# Patient Record
Sex: Female | Born: 1945
Health system: Southern US, Community
[De-identification: ages and names within clinical notes are randomized; demographics above are authoritative.]

## PROBLEM LIST (undated history)

## (undated) DIAGNOSIS — J984 Other disorders of lung: Secondary | ICD-10-CM

## (undated) DIAGNOSIS — E785 Hyperlipidemia, unspecified: Secondary | ICD-10-CM

## (undated) DIAGNOSIS — K219 Gastro-esophageal reflux disease without esophagitis: Secondary | ICD-10-CM

## (undated) DIAGNOSIS — K429 Umbilical hernia without obstruction or gangrene: Secondary | ICD-10-CM

## (undated) DIAGNOSIS — E119 Type 2 diabetes mellitus without complications: Secondary | ICD-10-CM

## (undated) DIAGNOSIS — I10 Essential (primary) hypertension: Secondary | ICD-10-CM

## (undated) DIAGNOSIS — C884 Extranodal marginal zone B-cell lymphoma of mucosa-associated lymphoid tissue [MALT-lymphoma]: Secondary | ICD-10-CM

## (undated) DIAGNOSIS — M519 Unspecified thoracic, thoracolumbar and lumbosacral intervertebral disc disorder: Secondary | ICD-10-CM

## (undated) DIAGNOSIS — E236 Other disorders of pituitary gland: Secondary | ICD-10-CM

## (undated) DIAGNOSIS — D509 Iron deficiency anemia, unspecified: Secondary | ICD-10-CM

## (undated) DIAGNOSIS — J309 Allergic rhinitis, unspecified: Secondary | ICD-10-CM

## (undated) DIAGNOSIS — K5730 Diverticulosis of large intestine without perforation or abscess without bleeding: Secondary | ICD-10-CM

## (undated) HISTORY — DX: Other disorders of lung: J98.4

## (undated) HISTORY — PX: UPPER GASTROINTESTINAL ENDOSCOPY: SHX188

## (undated) HISTORY — DX: Iron deficiency anemia, unspecified: D50.9

## (undated) HISTORY — DX: Allergic rhinitis, unspecified: J30.9

## (undated) HISTORY — DX: Type 2 diabetes mellitus without complications: E11.9

## (undated) HISTORY — DX: Hyperlipidemia, unspecified: E78.5

## (undated) HISTORY — DX: Diverticulosis of large intestine without perforation or abscess without bleeding: K57.30

## (undated) HISTORY — PX: NO PAST SURGERIES: SHX2092

## (undated) HISTORY — DX: Essential (primary) hypertension: I10

## (undated) HISTORY — DX: Unspecified thoracic, thoracolumbar and lumbosacral intervertebral disc disorder: M51.9

## (undated) HISTORY — DX: Extranodal marginal zone B-cell lymphoma of mucosa-associated lymphoid tissue (MALT-lymphoma): C88.4

## (undated) HISTORY — DX: Umbilical hernia without obstruction or gangrene: K42.9

## (undated) HISTORY — DX: Other disorders of pituitary gland: E23.6

## (undated) HISTORY — DX: Gastro-esophageal reflux disease without esophagitis: K21.9

---

## 2000-02-22 ENCOUNTER — Encounter (INDEPENDENT_AMBULATORY_CARE_PROVIDER_SITE_OTHER): Payer: Self-pay | Admitting: Specialist

## 2000-02-22 ENCOUNTER — Other Ambulatory Visit: Admission: RE | Admit: 2000-02-22 | Discharge: 2000-02-22 | Payer: Self-pay | Admitting: Internal Medicine

## 2000-02-24 ENCOUNTER — Ambulatory Visit (HOSPITAL_COMMUNITY): Admission: RE | Admit: 2000-02-24 | Discharge: 2000-02-24 | Payer: Self-pay | Admitting: Internal Medicine

## 2000-02-24 ENCOUNTER — Encounter: Payer: Self-pay | Admitting: Internal Medicine

## 2000-08-20 ENCOUNTER — Encounter (INDEPENDENT_AMBULATORY_CARE_PROVIDER_SITE_OTHER): Payer: Self-pay | Admitting: Specialist

## 2000-08-20 ENCOUNTER — Other Ambulatory Visit: Admission: RE | Admit: 2000-08-20 | Discharge: 2000-08-20 | Payer: Self-pay | Admitting: Internal Medicine

## 2001-01-30 ENCOUNTER — Other Ambulatory Visit: Admission: RE | Admit: 2001-01-30 | Discharge: 2001-01-30 | Payer: Self-pay | Admitting: Internal Medicine

## 2001-01-30 ENCOUNTER — Encounter (INDEPENDENT_AMBULATORY_CARE_PROVIDER_SITE_OTHER): Payer: Self-pay | Admitting: Specialist

## 2001-02-25 ENCOUNTER — Encounter: Admission: RE | Admit: 2001-02-25 | Discharge: 2001-05-26 | Payer: Self-pay | Admitting: *Deleted

## 2001-10-07 ENCOUNTER — Other Ambulatory Visit: Admission: RE | Admit: 2001-10-07 | Discharge: 2001-10-07 | Payer: Self-pay | Admitting: Internal Medicine

## 2001-10-07 ENCOUNTER — Encounter (INDEPENDENT_AMBULATORY_CARE_PROVIDER_SITE_OTHER): Payer: Self-pay

## 2004-10-16 ENCOUNTER — Ambulatory Visit: Payer: Self-pay | Admitting: Oncology

## 2004-10-24 ENCOUNTER — Ambulatory Visit (HOSPITAL_COMMUNITY): Admission: RE | Admit: 2004-10-24 | Discharge: 2004-10-24 | Payer: Self-pay | Admitting: Neurosurgery

## 2005-05-01 ENCOUNTER — Ambulatory Visit: Payer: Self-pay | Admitting: Oncology

## 2005-10-31 ENCOUNTER — Ambulatory Visit: Payer: Self-pay | Admitting: Internal Medicine

## 2005-12-07 ENCOUNTER — Ambulatory Visit: Payer: Self-pay | Admitting: Internal Medicine

## 2005-12-21 ENCOUNTER — Ambulatory Visit: Payer: Self-pay | Admitting: Internal Medicine

## 2006-02-01 ENCOUNTER — Ambulatory Visit: Payer: Self-pay | Admitting: Oncology

## 2006-05-23 ENCOUNTER — Ambulatory Visit: Payer: Self-pay | Admitting: Internal Medicine

## 2006-06-04 ENCOUNTER — Ambulatory Visit (HOSPITAL_COMMUNITY): Admission: RE | Admit: 2006-06-04 | Discharge: 2006-06-04 | Payer: Self-pay | Admitting: Internal Medicine

## 2006-10-03 ENCOUNTER — Ambulatory Visit: Payer: Self-pay | Admitting: Internal Medicine

## 2006-10-04 ENCOUNTER — Ambulatory Visit: Payer: Self-pay

## 2007-01-07 ENCOUNTER — Ambulatory Visit: Payer: Self-pay | Admitting: Oncology

## 2007-01-10 LAB — CBC WITH DIFFERENTIAL/PLATELET
BASO%: 1.3 % (ref 0.0–2.0)
Basophils Absolute: 0.1 10*3/uL (ref 0.0–0.1)
EOS%: 1.7 % (ref 0.0–7.0)
HCT: 38.3 % (ref 34.8–46.6)
HGB: 12.7 g/dL (ref 11.6–15.9)
LYMPH%: 44.6 % (ref 14.0–48.0)
MCH: 29.1 pg (ref 26.0–34.0)
MCHC: 33.1 g/dL (ref 32.0–36.0)
MCV: 88 fL (ref 81.0–101.0)
MONO%: 7.7 % (ref 0.0–13.0)
NEUT%: 44.7 % (ref 39.6–76.8)
lymph#: 2.6 10*3/uL (ref 0.9–3.3)

## 2007-03-19 ENCOUNTER — Ambulatory Visit: Payer: Self-pay | Admitting: Internal Medicine

## 2007-03-19 ENCOUNTER — Ambulatory Visit (HOSPITAL_COMMUNITY): Admission: RE | Admit: 2007-03-19 | Discharge: 2007-03-19 | Payer: Self-pay | Admitting: Internal Medicine

## 2007-03-19 LAB — CONVERTED CEMR LAB
Albumin: 3.6 g/dL (ref 3.5–5.2)
Basophils Absolute: 0 10*3/uL (ref 0.0–0.1)
Basophils Relative: 0.7 % (ref 0.0–1.0)
Bilirubin Urine: NEGATIVE
Bilirubin, Direct: 0.1 mg/dL (ref 0.0–0.3)
Calcium: 9.5 mg/dL (ref 8.4–10.5)
Eosinophils Absolute: 0.1 10*3/uL (ref 0.0–0.6)
GFR calc Af Amer: 82 mL/min
GFR calc non Af Amer: 68 mL/min
Glucose, Bld: 101 mg/dL — ABNORMAL HIGH (ref 70–99)
HCT: 37.4 % (ref 36.0–46.0)
Hemoglobin, Urine: NEGATIVE
Hemoglobin: 12.9 g/dL (ref 12.0–15.0)
Hgb A1c MFr Bld: 6.2 % — ABNORMAL HIGH (ref 4.6–6.0)
Ketones, ur: NEGATIVE mg/dL
Leukocytes, UA: NEGATIVE
Lipase: 47 units/L (ref 11.0–59.0)
Lymphocytes Relative: 50.9 % — ABNORMAL HIGH (ref 12.0–46.0)
Neutrophils Relative %: 39.2 % — ABNORMAL LOW (ref 43.0–77.0)
RBC / HPF: NONE SEEN
Sodium: 146 meq/L — ABNORMAL HIGH (ref 135–145)
Specific Gravity, Urine: 1.015 (ref 1.000–1.03)
Total Protein, Urine: NEGATIVE mg/dL
WBC, UA: NONE SEEN cells/hpf
WBC: 5.5 10*3/uL (ref 4.5–10.5)
pH: 7.5 (ref 5.0–8.0)

## 2007-03-29 ENCOUNTER — Ambulatory Visit: Payer: Self-pay | Admitting: Cardiology

## 2007-06-24 ENCOUNTER — Ambulatory Visit: Payer: Self-pay | Admitting: Internal Medicine

## 2007-06-24 DIAGNOSIS — E118 Type 2 diabetes mellitus with unspecified complications: Secondary | ICD-10-CM

## 2007-06-24 DIAGNOSIS — E1122 Type 2 diabetes mellitus with diabetic chronic kidney disease: Secondary | ICD-10-CM | POA: Insufficient documentation

## 2007-06-24 DIAGNOSIS — I10 Essential (primary) hypertension: Secondary | ICD-10-CM

## 2007-06-24 DIAGNOSIS — E119 Type 2 diabetes mellitus without complications: Secondary | ICD-10-CM

## 2007-06-24 HISTORY — DX: Type 2 diabetes mellitus without complications: E11.9

## 2007-06-24 HISTORY — DX: Essential (primary) hypertension: I10

## 2008-01-07 ENCOUNTER — Ambulatory Visit: Payer: Self-pay | Admitting: Oncology

## 2008-01-07 ENCOUNTER — Ambulatory Visit: Payer: Self-pay | Admitting: Internal Medicine

## 2008-01-07 DIAGNOSIS — D649 Anemia, unspecified: Secondary | ICD-10-CM | POA: Insufficient documentation

## 2008-01-07 DIAGNOSIS — R079 Chest pain, unspecified: Secondary | ICD-10-CM

## 2008-01-07 DIAGNOSIS — D509 Iron deficiency anemia, unspecified: Secondary | ICD-10-CM

## 2008-01-07 DIAGNOSIS — J309 Allergic rhinitis, unspecified: Secondary | ICD-10-CM

## 2008-01-07 DIAGNOSIS — K5730 Diverticulosis of large intestine without perforation or abscess without bleeding: Secondary | ICD-10-CM | POA: Insufficient documentation

## 2008-01-07 HISTORY — DX: Iron deficiency anemia, unspecified: D50.9

## 2008-01-07 HISTORY — DX: Allergic rhinitis, unspecified: J30.9

## 2008-01-07 HISTORY — DX: Diverticulosis of large intestine without perforation or abscess without bleeding: K57.30

## 2008-01-08 LAB — CONVERTED CEMR LAB
AST: 24 units/L (ref 0–37)
Bilirubin, Direct: 0.1 mg/dL (ref 0.0–0.3)
Chloride: 102 meq/L (ref 96–112)
Cholesterol: 182 mg/dL (ref 0–200)
Creatinine,U: 57.6 mg/dL
Eosinophils Absolute: 0.2 10*3/uL (ref 0.0–0.6)
Eosinophils Relative: 2.3 % (ref 0.0–5.0)
GFR calc non Af Amer: 68 mL/min
Glucose, Bld: 134 mg/dL — ABNORMAL HIGH (ref 70–99)
HCT: 40.3 % (ref 36.0–46.0)
Hemoglobin: 13 g/dL (ref 12.0–15.0)
Hgb A1c MFr Bld: 6.4 % — ABNORMAL HIGH (ref 4.6–6.0)
Leukocytes, UA: NEGATIVE
Lymphocytes Relative: 39.8 % (ref 12.0–46.0)
MCV: 87.5 fL (ref 78.0–100.0)
Neutro Abs: 3.7 10*3/uL (ref 1.4–7.7)
Neutrophils Relative %: 52.8 % (ref 43.0–77.0)
Nitrite: NEGATIVE
RBC: 4.61 M/uL (ref 3.87–5.11)
Sodium: 138 meq/L (ref 135–145)
Urobilinogen, UA: 0.2 (ref 0.0–1.0)
WBC: 6.9 10*3/uL (ref 4.5–10.5)

## 2008-01-09 ENCOUNTER — Encounter: Payer: Self-pay | Admitting: Internal Medicine

## 2008-01-09 DIAGNOSIS — J984 Other disorders of lung: Secondary | ICD-10-CM

## 2008-01-09 HISTORY — DX: Other disorders of lung: J98.4

## 2008-01-13 ENCOUNTER — Ambulatory Visit: Payer: Self-pay | Admitting: Internal Medicine

## 2008-02-28 ENCOUNTER — Ambulatory Visit: Payer: Self-pay | Admitting: Oncology

## 2008-03-04 ENCOUNTER — Encounter: Payer: Self-pay | Admitting: Internal Medicine

## 2008-06-01 ENCOUNTER — Ambulatory Visit: Payer: Self-pay | Admitting: Internal Medicine

## 2008-06-01 DIAGNOSIS — E785 Hyperlipidemia, unspecified: Secondary | ICD-10-CM

## 2008-06-01 HISTORY — DX: Hyperlipidemia, unspecified: E78.5

## 2008-06-04 ENCOUNTER — Encounter: Payer: Self-pay | Admitting: Internal Medicine

## 2008-06-24 ENCOUNTER — Telehealth: Payer: Self-pay | Admitting: Internal Medicine

## 2008-11-12 ENCOUNTER — Ambulatory Visit: Payer: Self-pay | Admitting: Internal Medicine

## 2008-11-12 DIAGNOSIS — J019 Acute sinusitis, unspecified: Secondary | ICD-10-CM | POA: Insufficient documentation

## 2008-11-12 DIAGNOSIS — R5383 Other fatigue: Secondary | ICD-10-CM

## 2008-11-12 DIAGNOSIS — R5381 Other malaise: Secondary | ICD-10-CM | POA: Insufficient documentation

## 2008-11-12 DIAGNOSIS — M25519 Pain in unspecified shoulder: Secondary | ICD-10-CM | POA: Insufficient documentation

## 2008-11-12 DIAGNOSIS — R930 Abnormal findings on diagnostic imaging of skull and head, not elsewhere classified: Secondary | ICD-10-CM | POA: Insufficient documentation

## 2008-11-12 LAB — CONVERTED CEMR LAB
Basophils Absolute: 0 10*3/uL (ref 0.0–0.1)
Bilirubin Urine: NEGATIVE
Chloride: 105 meq/L (ref 96–112)
Creatinine, Ser: 1 mg/dL (ref 0.4–1.2)
Crystals: NEGATIVE
Folate: 15.5 ng/mL
GFR calc Af Amer: 72 mL/min
GFR calc non Af Amer: 60 mL/min
HDL: 52.1 mg/dL (ref >39.0)
Hemoglobin, Urine: NEGATIVE
Hgb A1c MFr Bld: 6.4 % — ABNORMAL HIGH (ref 4.6–6.0)
LDL Cholesterol: 77 mg/dL (ref 0–99)
Lymphocytes Relative: 42.6 % (ref 12.0–46.0)
Monocytes Relative: 7.9 % (ref 3.0–12.0)
Nitrite: NEGATIVE
Platelets: 192 10*3/uL (ref 150–400)
Potassium: 3.7 meq/L (ref 3.5–5.1)
RBC / HPF: NONE SEEN
RDW: 13.9 % (ref 11.5–14.6)
Total CHOL/HDL Ratio: 2.7
Urobilinogen, UA: 0.2 (ref 0.0–1.0)
VLDL: 13 mg/dL (ref 0–40)
Vitamin B-12: 496 pg/mL (ref 211–911)

## 2009-02-09 ENCOUNTER — Encounter: Payer: Self-pay | Admitting: Internal Medicine

## 2009-02-26 ENCOUNTER — Ambulatory Visit: Payer: Self-pay | Admitting: Oncology

## 2009-04-02 ENCOUNTER — Ambulatory Visit: Payer: Self-pay | Admitting: Internal Medicine

## 2009-04-22 ENCOUNTER — Telehealth: Payer: Self-pay | Admitting: Internal Medicine

## 2009-04-23 ENCOUNTER — Telehealth: Payer: Self-pay | Admitting: Internal Medicine

## 2009-04-28 ENCOUNTER — Telehealth: Payer: Self-pay | Admitting: Internal Medicine

## 2009-05-11 ENCOUNTER — Ambulatory Visit: Payer: Self-pay | Admitting: Internal Medicine

## 2009-05-11 LAB — CONVERTED CEMR LAB: Microalb, Ur: 5.7 mg/dL — ABNORMAL HIGH (ref 0.0–1.9)

## 2009-05-24 ENCOUNTER — Ambulatory Visit: Payer: Self-pay | Admitting: Internal Medicine

## 2009-05-24 DIAGNOSIS — K219 Gastro-esophageal reflux disease without esophagitis: Secondary | ICD-10-CM

## 2009-05-24 DIAGNOSIS — N959 Unspecified menopausal and perimenopausal disorder: Secondary | ICD-10-CM | POA: Insufficient documentation

## 2009-05-24 HISTORY — DX: Gastro-esophageal reflux disease without esophagitis: K21.9

## 2009-05-24 LAB — CONVERTED CEMR LAB
ALT: 27 units/L (ref 0–35)
AST: 29 units/L (ref 0–37)
Basophils Relative: 0.7 % (ref 0.0–3.0)
Bilirubin, Direct: 0.1 mg/dL (ref 0.0–0.3)
Chloride: 112 meq/L (ref 96–112)
Eosinophils Relative: 7.7 % — ABNORMAL HIGH (ref 0.0–5.0)
GFR calc non Af Amer: 72.25 mL/min (ref >60)
HCT: 38.5 % (ref 36.0–46.0)
Hemoglobin, Urine: NEGATIVE
LDL Cholesterol: 68 mg/dL (ref 0–99)
Lymphs Abs: 1.8 10*3/uL (ref 0.7–4.0)
MCV: 86.6 fL (ref 78.0–100.0)
Monocytes Absolute: 0.3 10*3/uL (ref 0.1–1.0)
Monocytes Relative: 6.4 % (ref 3.0–12.0)
Neutrophils Relative %: 43.2 % (ref 43.0–77.0)
Nitrite: NEGATIVE
Potassium: 3.8 meq/L (ref 3.5–5.1)
RBC: 4.45 M/uL (ref 3.87–5.11)
Sodium: 143 meq/L (ref 135–145)
Specific Gravity, Urine: 1.01 (ref 1.000–1.030)
Total Bilirubin: 1 mg/dL (ref 0.3–1.2)
Total CHOL/HDL Ratio: 3
Total Protein: 7.2 g/dL (ref 6.0–8.3)
Triglycerides: 79 mg/dL (ref 0.0–149.0)
Urine Glucose: NEGATIVE mg/dL
Urobilinogen, UA: 0.2 (ref 0.0–1.0)
WBC: 4.2 10*3/uL — ABNORMAL LOW (ref 4.5–10.5)

## 2009-06-07 ENCOUNTER — Ambulatory Visit: Payer: Self-pay | Admitting: Internal Medicine

## 2009-06-07 ENCOUNTER — Encounter: Payer: Self-pay | Admitting: Internal Medicine

## 2009-06-17 ENCOUNTER — Telehealth (INDEPENDENT_AMBULATORY_CARE_PROVIDER_SITE_OTHER): Payer: Self-pay

## 2009-06-21 ENCOUNTER — Encounter: Payer: Self-pay | Admitting: Cardiovascular Disease

## 2009-06-21 ENCOUNTER — Ambulatory Visit: Payer: Self-pay

## 2010-04-11 ENCOUNTER — Ambulatory Visit: Payer: Self-pay | Admitting: Internal Medicine

## 2010-04-11 DIAGNOSIS — M549 Dorsalgia, unspecified: Secondary | ICD-10-CM | POA: Insufficient documentation

## 2010-04-11 DIAGNOSIS — R040 Epistaxis: Secondary | ICD-10-CM

## 2010-04-11 DIAGNOSIS — R609 Edema, unspecified: Secondary | ICD-10-CM | POA: Insufficient documentation

## 2010-04-11 LAB — CONVERTED CEMR LAB
ALT: 28 units/L (ref 0–35)
Alkaline Phosphatase: 74 units/L (ref 39–117)
BUN: 10 mg/dL (ref 6–23)
Basophils Relative: 0.8 % (ref 0.0–3.0)
Bilirubin, Direct: 0.1 mg/dL (ref 0.0–0.3)
Calcium: 9.2 mg/dL (ref 8.4–10.5)
Creatinine, Ser: 0.9 mg/dL (ref 0.4–1.2)
Creatinine,U: 79.8 mg/dL
Eosinophils Relative: 8.6 % — ABNORMAL HIGH (ref 0.0–5.0)
GFR calc non Af Amer: 81.36 mL/min (ref >60)
Ketones, ur: NEGATIVE mg/dL
LDL Cholesterol: 71 mg/dL (ref 0–99)
Lymphocytes Relative: 38 % (ref 12.0–46.0)
MCV: 82.8 fL (ref 78.0–100.0)
Monocytes Absolute: 0.5 10*3/uL (ref 0.1–1.0)
Monocytes Relative: 9 % (ref 3.0–12.0)
Neutrophils Relative %: 43.6 % (ref 43.0–77.0)
Platelets: 189 10*3/uL (ref 150.0–400.0)
RBC: 4.31 M/uL (ref 3.87–5.11)
Specific Gravity, Urine: 1.015 (ref 1.000–1.030)
TSH: 0.79 microintl units/mL (ref 0.35–5.50)
Total Bilirubin: 0.6 mg/dL (ref 0.3–1.2)
Total CHOL/HDL Ratio: 3
Total Protein, Urine: NEGATIVE mg/dL
Triglycerides: 79 mg/dL (ref 0.0–149.0)
Urine Glucose: NEGATIVE mg/dL
WBC: 5 10*3/uL (ref 4.5–10.5)

## 2010-04-18 ENCOUNTER — Encounter: Payer: Self-pay | Admitting: Internal Medicine

## 2010-05-02 ENCOUNTER — Encounter: Payer: Self-pay | Admitting: Internal Medicine

## 2010-08-20 IMAGING — CR DG CHEST 2V
2 series · 2 of 2 positions shown · non-contrast
Comparison: Chest 11/12/2008.

CLINICAL DATA: Chest pain.

CHEST - 2 VIEW

[view not recorded (1 of 2)]
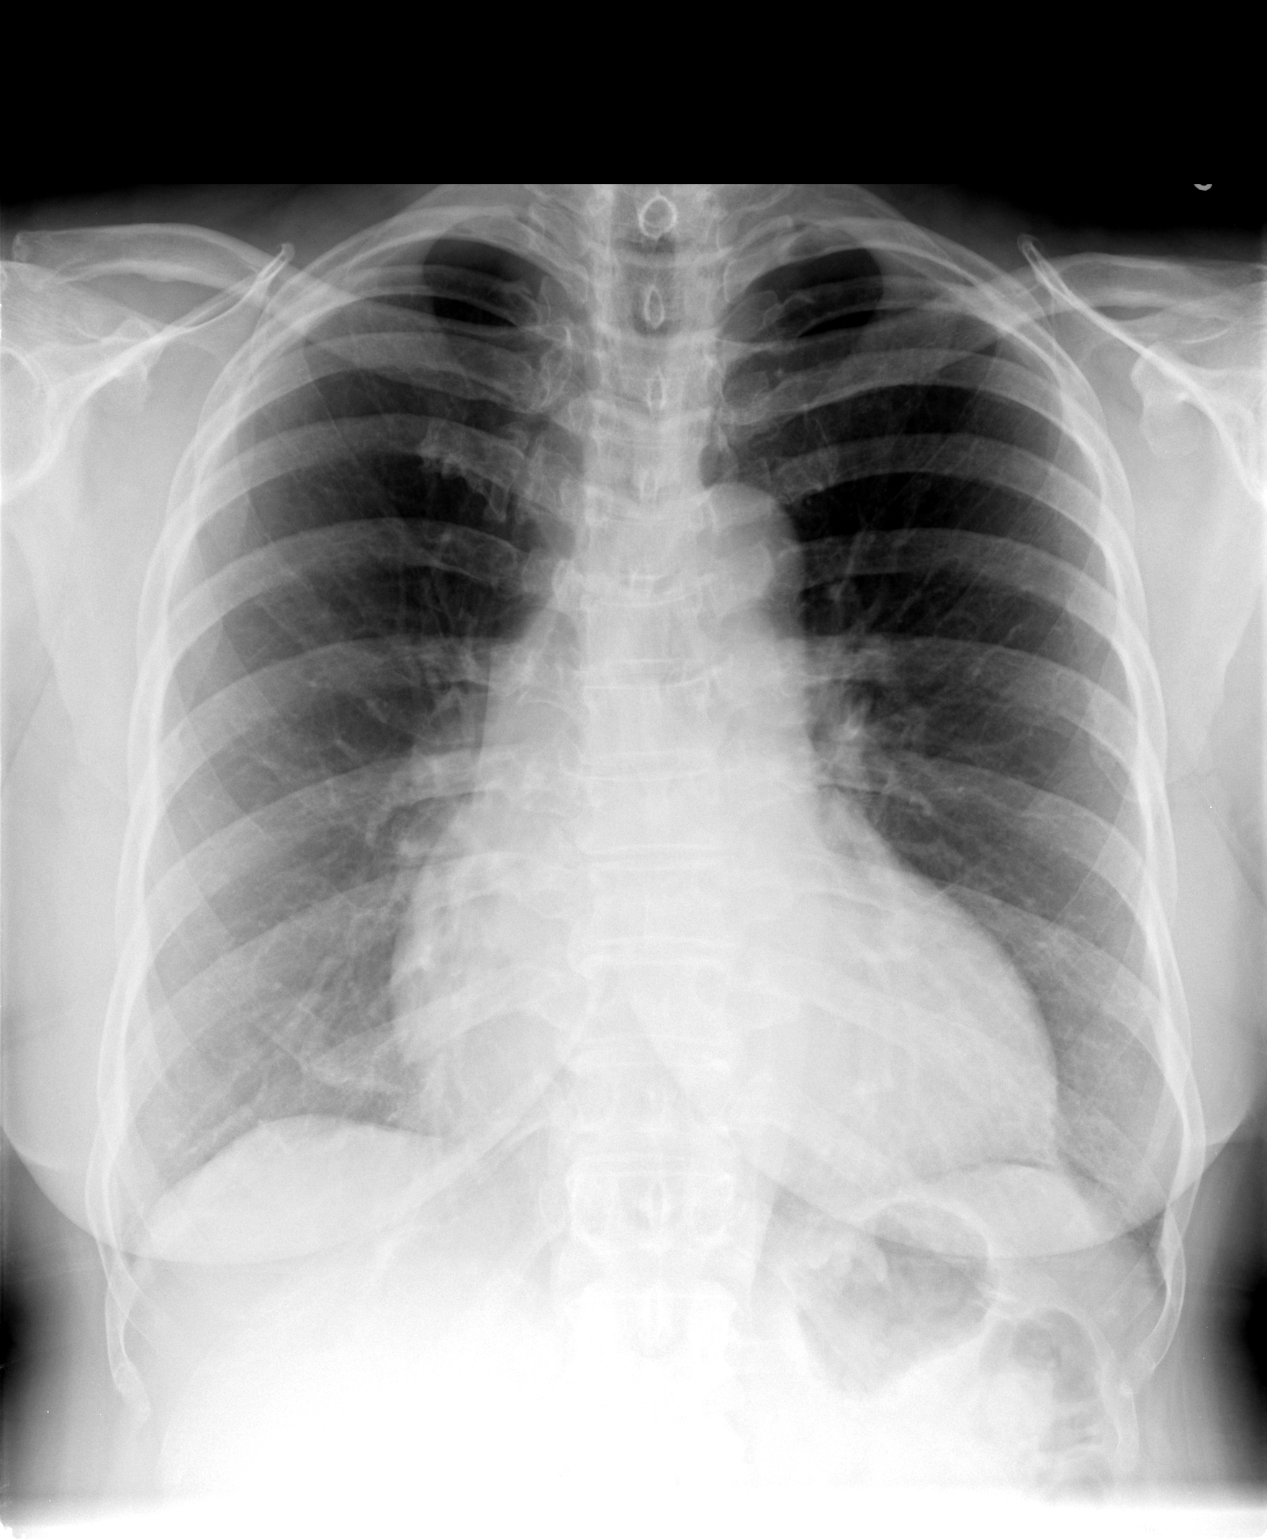

[view not recorded (2 of 2)]
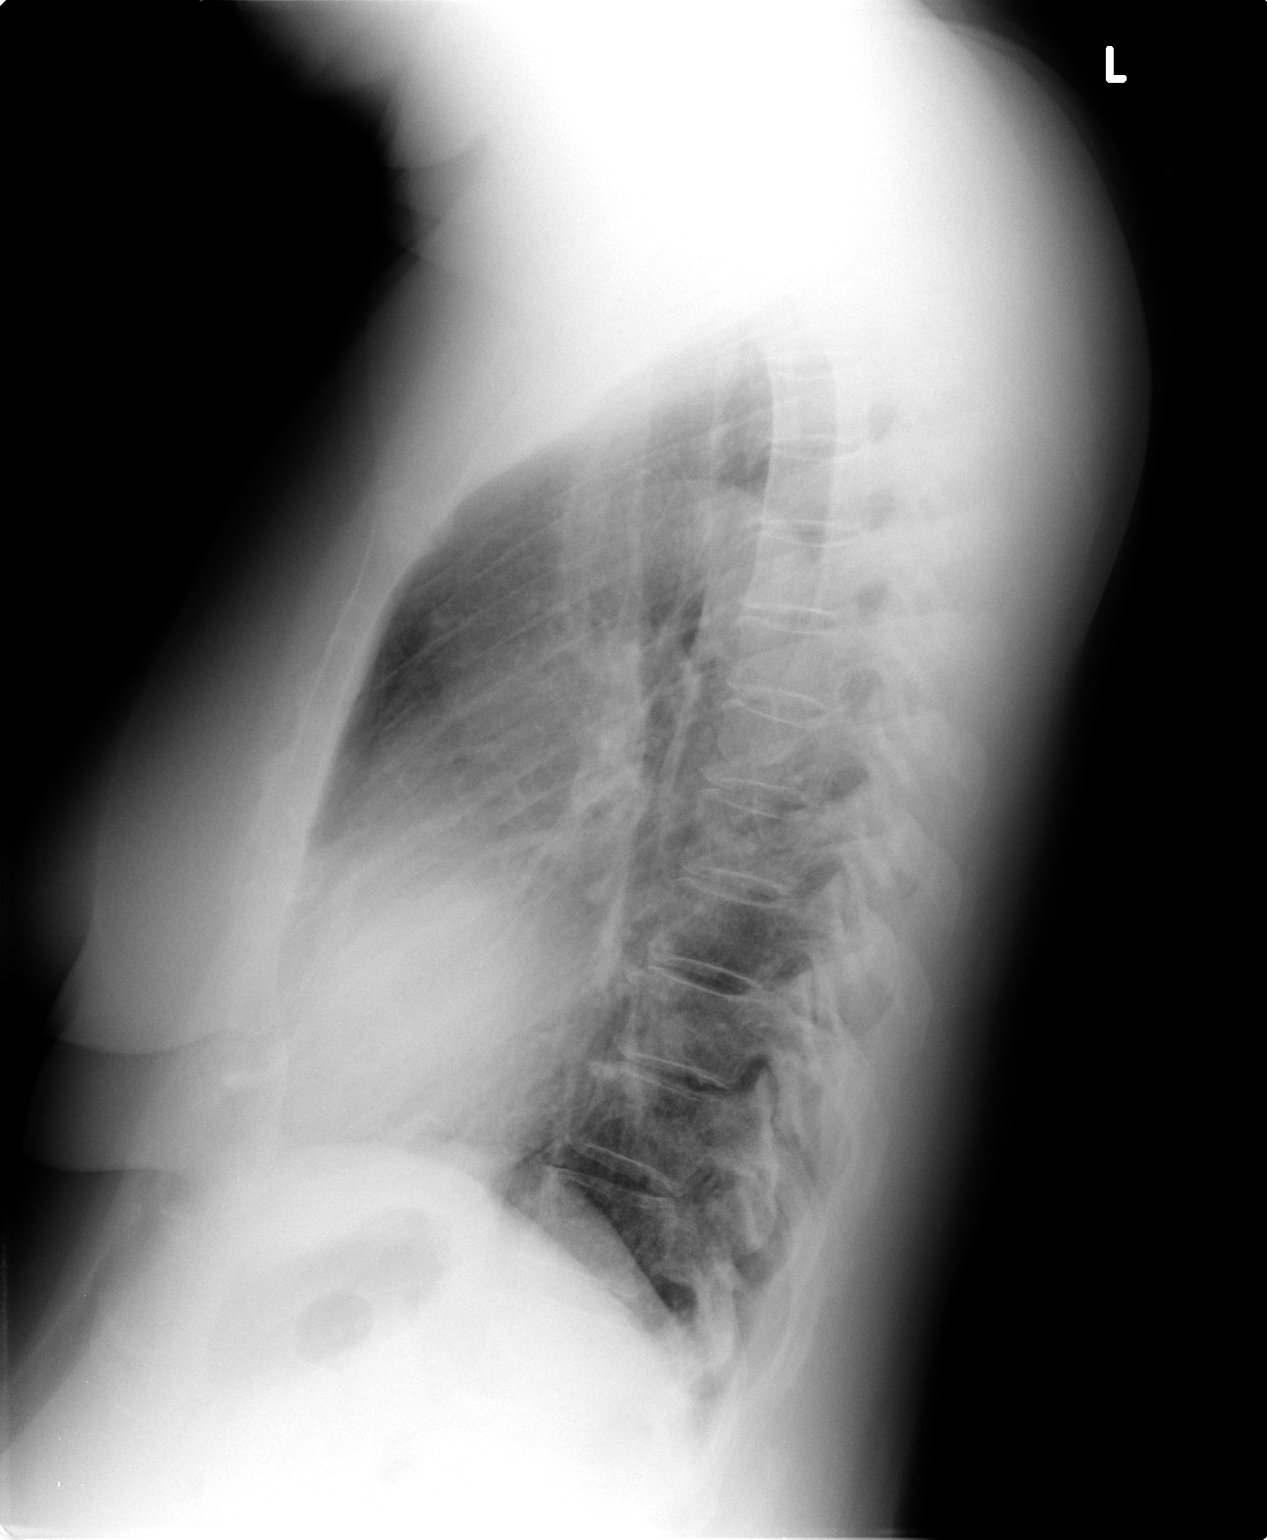

[2 of 2 positions shown; findings below may reference images not displayed]

FINDINGS: There is cardiomegaly but no pulmonary edema.  Lungs
clear.  No effusion.
IMPRESSION: Cardiomegaly without acute disease.  Stable compared to prior exam.

## 2011-01-10 NOTE — Letter (Signed)
Summary: [facility]  [facility]   Imported By: Sherian Rein 05/06/2010 12:17:38  _____________________________________________________________________  External Attachment:    Type:   Image     Comment:   External Document

## 2011-01-10 NOTE — Assessment & Plan Note (Signed)
Summary: FU Andrea Anthony  #   Vital Signs:  Patient profile:   65 year old female Height:      66 inches Weight:      223.25 pounds BMI:     36.16 O2 Sat:      99 % on Room air Temp:     98.2 degrees F oral Pulse rate:   59 / minute BP sitting:   110 / 72  (left arm) Cuff size:   large  Vitals Entered ByZella Ball Ewing (Apr 11, 2010 9:29 AM)  O2 Flow:  Room air CC: Followup/RE   CC:  Followup/RE.  History of Present Illness: overall doing well, c/o bilat lower ant sharp chest pains off and on for over a year, some tender to touch,  nonpleuritic and nonexertional, recent stress test neg;  Pt denies other CP, sob, doe, wheezing, orthopnea, pnd, worsening LE edema, palps, dizziness or syncope .  Pt denies new neuro symptoms such as headache, facial or extremity weakness  Pt denies polydipsia, polyuria, or low sugar symptoms such as shakiness improved with eating.  Overall good compliance with meds, trying to follow low chol, DM diet, wt stable, little excercise however  Does have recurrent nosebleeds as well at least once per month for several years.  Still with persistent mild edema bialt LE.   Also with ongoing chronic stable back ache without change in bowel or bladder, LE pain, numb, weakness, wt loss, night sweats, fever., falls or injury/  Problems Prior to Update: 1)  Peripheral Edema  (ICD-782.3) 2)  Back Pain  (ICD-724.5) 3)  Epistaxis, Recurrent  (ICD-784.7) 4)  Menopausal Disorder  (ICD-627.9) 5)  Gerd  (ICD-530.81) 6)  Preventive Health Care  (ICD-V70.0) 7)  Fatigue  (ICD-780.79) 8)  Computerized Tomography, Chest, Abnormal  (ICD-793.1) 9)  Shoulder Pain, Left  (ICD-719.41) 10)  Sinusitis- Acute-nos  (ICD-461.9) 11)  Hyperlipidemia  (ICD-272.4) 12)  Pulmonary Nodule, Right Lower Lobe  (ICD-518.89) 13)  Diverticulosis, Colon  (ICD-562.10) 14)  Anemia-iron Deficiency  (ICD-280.9) 15)  Allergic Rhinitis  (ICD-477.9) 16)  Anemia-nos  (ICD-285.9) 17)  Chest Pain   (ICD-786.50) 18)  Preventive Health Care  (ICD-V70.0) 19)  Hypertension  (ICD-401.9) 20)  Diabetes Mellitus, Type II  (ICD-250.00)  Medications Prior to Update: 1)  Actos 45 Mg Tabs (Pioglitazone Hcl) .Marland Kitchen.. 1po Once Daily 2)  Diovan Hct 160-12.5 Mg Tabs (Valsartan-Hydrochlorothiazide) .Marland Kitchen.. 1 By Mouth Once Daily 3)  Tramadol Hcl 50 Mg  Tabs (Tramadol Hcl) .Marland Kitchen.. 1 - 2 By Mouth Q 6 Hrs As Needed Pain 4)  Lovastatin 40 Mg  Tabs (Lovastatin) .Marland Kitchen.. 1 By Mouth Once Daily 5)  Adult Aspirin Ec Low Strength 81 Mg  Tbec (Aspirin) .Marland Kitchen.. 1 By Mouth Once Daily 6)  Omeprazole 20 Mg Cpdr (Omeprazole) .Marland Kitchen.. 1 By Mouth Once Daily  Current Medications (verified): 1)  Actos 45 Mg Tabs (Pioglitazone Hcl) .Marland Kitchen.. 1po Once Daily 2)  Diovan Hct 160-25 Mg Tabs (Valsartan-Hydrochlorothiazide) .Marland Kitchen.. 1po Once Daily 3)  Tramadol Hcl 50 Mg  Tabs (Tramadol Hcl) .Marland Kitchen.. 1 - 2 By Mouth Q 6 Hrs As Needed Pain 4)  Lovastatin 40 Mg  Tabs (Lovastatin) .Marland Kitchen.. 1 By Mouth Once Daily 5)  Adult Aspirin Ec Low Strength 81 Mg  Tbec (Aspirin) .Marland Kitchen.. 1 By Mouth Once Daily 6)  Omeprazole 20 Mg Cpdr (Omeprazole) .Marland Kitchen.. 1 By Mouth Once Daily 7)  Meloxicam 15 Mg Tabs (Meloxicam) .Marland Kitchen.. 1po Once Daily As Needed  Allergies (verified): 1)  ! 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 Hypertension Diabetes mellitus, type II Allergic rhinitis Anemia-iron deficiency Diverticulosis, colon hx of H Pylori tx Hyperlipidemia GERD  Past Surgical History: Last updated: 01/07/2008 Denies surgical history  Family History: Last updated: 01/07/2008 no cancer  Social History: Last updated: 01/07/2008 Never Smoked Alcohol use-no emigre  from [place] from 1995/limited english Married 7 children - 1 daughter in [place]  Risk Factors: Smoking Status: never (01/07/2008)  Review of Systems  The patient denies anorexia, fever, vision  loss, decreased hearing, hoarseness, chest pain, syncope, dyspnea on exertion, peripheral edema, prolonged cough, headaches, hemoptysis, abdominal pain, melena, hematochezia, severe indigestion/heartburn, hematuria, muscle weakness, suspicious skin lesions, transient blindness, difficulty walking, depression, unusual weight change, abnormal bleeding, enlarged lymph nodes, angioedema, and breast masses.         all otherwise negative per pt -    Physical Exam  General:  alert and overweight-appearing.   Head:  normocephalic and atraumatic.   Eyes:  vision grossly intact, pupils equal, and pupils round.   Ears:  R ear normal and L ear normal.   Nose:  no external deformity and no nasal discharge.   Mouth:  no gingival abnormalities and pharynx pink and moist.   Neck:  supple and no masses.   Lungs:  normal respiratory effort and normal breath sounds.   Heart:  normal rate and regular rhythm.   Abdomen:  soft, non-tender, and normal bowel sounds.   Msk:  no joint tenderness and no joint swelling.   Extremities:  no edema, no erythema  Neurologic:  cranial nerves II-XII intact and strength normal in all extremities.   Skin:  color normal and no rashes.   Psych:  normally interactive and not depressed appearing.     Impression & Recommendations:  Problem # 1:  Preventive Health Care (ICD-V70.0)  Overall doing well, age appropriate education and counseling updated and referral for appropriate preventive services done unless declined, immunizations up to date or declined, diet counseling done if overweight, urged to quit smoking if smokes , most recent labs reviewed and current ordered if appropriate, ecg reviewed or declined (interpretation per ECG scanned in the EMR if done); information regarding Medicare Prevention requirements given if appropriate   Orders: T-Vitamin D (25-Hydroxy) (73220-25427) TLB-BMP (Basic Metabolic Panel-BMET) (80048-METABOL) TLB-CBC Platelet - w/Differential  (85025-CBCD) TLB-Hepatic/Liver Function Pnl (80076-HEPATIC) TLB-Lipid Panel (80061-LIPID) TLB-TSH (Thyroid Stimulating Hormone) (84443-TSH) TLB-Udip ONLY (81003-UDIP)  Problem # 2:  ANEMIA-IRON DEFICIENCY (ICD-280.9) to check labs, asympt  Problem # 3:  GERD (ICD-530.81)  Her updated medication list for this problem includes:    Omeprazole 20 Mg Cpdr (Omeprazole) .Marland Kitchen... 1 by mouth once daily urged to take med daily (has been taking as needed )  Problem # 4:  EPISTAXIS, RECURRENT (ICD-784.7)  chronic recurrent per pt - refer ENT  Orders: ENT Referral (ENT)  Problem # 5:  BACK PAIN (ICD-724.5)  Her updated medication list for this problem includes:    Tramadol Hcl 50 Mg Tabs (Tramadol hcl) .Marland Kitchen... 1 - 2 by mouth q 6 hrs as needed pain    Adult Aspirin Ec Low Strength 81 Mg Tbec (Aspirin) .Marland Kitchen... 1 by mouth once daily    Meloxicam 15 Mg Tabs (Meloxicam) .Marland Kitchen... 1po once daily as needed add as needed nsaid , Continue all other previous medications as before this visit    Problem # 6:  PERIPHERAL EDEMA (  ICD-782.3)  Her updated medication list for this problem includes:    Diovan Hct 160-25 Mg Tabs (Valsartan-hydrochlorothiazide) .Marland Kitchen... 1po once daily mild ongoing  L>R ;  treat as above, f/u any worsening signs or symptoms   Problem # 7:  HYPERTENSION (ICD-401.9)  Her updated medication list for this problem includes:    Diovan Hct 160-25 Mg Tabs (Valsartan-hydrochlorothiazide) .Marland Kitchen... 1po once daily  BP today: 110/72 Prior BP: 134/84 (05/24/2009)  Labs Reviewed: K+: 3.8 (05/24/2009) Creat: : 1.0 (05/24/2009)   Chol: 128 (05/24/2009)   HDL: 44.40 (05/24/2009)   LDL: 68 (05/24/2009)   TG: 79.0 (05/24/2009) stable overall by hx and exam, ok to continue meds/tx as is   Problem # 8:  DIABETES MELLITUS, TYPE II (ICD-250.00)  Her updated medication list for this problem includes:    Actos 45 Mg Tabs (Pioglitazone hcl) .Marland Kitchen... 1po once daily    Diovan Hct 160-25 Mg Tabs  (Valsartan-hydrochlorothiazide) .Marland Kitchen... 1po once daily    Adult Aspirin Ec Low Strength 81 Mg Tbec (Aspirin) .Marland Kitchen... 1 by mouth once daily  Orders: TLB-Microalbumin/Creat Ratio, Urine (82043-MALB) TLB-A1C / Hgb A1C (Glycohemoglobin) (83036-A1C)  Labs Reviewed: Creat: 1.0 (05/24/2009)    Reviewed HgBA1c results: 6.2 (05/11/2009)  6.4 (11/12/2008) stable overall by hx and exam, ok to continue meds/tx as is   Problem # 9:  CHEST PAIN (ICD-786.50) chronic recurrent - suspect msk +/- reflux - treat as above, f/u any worsening signs or symptoms   Complete Medication List: 1)  Actos 45 Mg Tabs (Pioglitazone hcl) .Marland Kitchen.. 1po once daily 2)  Diovan Hct 160-25 Mg Tabs (Valsartan-hydrochlorothiazide) .Marland Kitchen.. 1po once daily 3)  Tramadol Hcl 50 Mg Tabs (Tramadol hcl) .Marland Kitchen.. 1 - 2 by mouth q 6 hrs as needed pain 4)  Lovastatin 40 Mg Tabs (Lovastatin) .Marland Kitchen.. 1 by mouth once daily 5)  Adult Aspirin Ec Low Strength 81 Mg Tbec (Aspirin) .Marland Kitchen.. 1 by mouth once daily 6)  Omeprazole 20 Mg Cpdr (Omeprazole) .Marland Kitchen.. 1 by mouth once daily 7)  Meloxicam 15 Mg Tabs (Meloxicam) .Marland Kitchen.. 1po once daily as needed  Patient Instructions: 1)  Please take all new medications as prescribed - the anti-inflammatory (meloxicam) for pain, and the increased Diovan HCT for swelling and blood pressure 2)  Continue all previous medications as before this visit  3)  Please go to the Lab in the basement for your blood and/or urine tests today 4)  Please schedule a follow-up appointment in 6 months with : 5)  BMP prior to visit, ICD-9: 250.02 6)  Lipid Panel prior to visit, ICD-9: 7)  HbgA1C prior to visit, ICD-9: Prescriptions: MELOXICAM 15 MG TABS (MELOXICAM) 1po once daily as needed  #90 x 3   Entered and Authorized by:   Corwin Levins MD   Signed by:   Corwin Levins MD on 04/11/2010   Method used:   Print then Give to Patient   RxID:   [id number] DIOVAN HCT 160-25 MG TABS (VALSARTAN-HYDROCHLOROTHIAZIDE) 1po once daily  #90 x 3   Entered  and Authorized by:   Corwin Levins MD   Signed by:   Corwin Levins MD on 04/11/2010   Method used:   Print then Give to Patient   RxID:   [id number] OMEPRAZOLE 20 MG CPDR (OMEPRAZOLE) 1 by mouth once daily  #90 x 3   Entered and Authorized by:   Corwin Levins MD   Signed by:   Corwin Levins MD on 04/11/2010   Method used:  Print then Give to Patient   RxID:   [id number] ACTOS 45 MG TABS (PIOGLITAZONE HCL) 1po once daily  #90 x 3   Entered and Authorized by:   Corwin Levins MD   Signed by:   Corwin Levins MD on 04/11/2010   Method used:   Print then Give to Patient   RxID:   [id number] DIOVAN HCT 160-12.5 MG TABS (VALSARTAN-HYDROCHLOROTHIAZIDE) 1 by mouth once daily  #90 x 3   Entered and Authorized by:   Corwin Levins MD   Signed by:   Corwin Levins MD on 04/11/2010   Method used:   Print then Give to Patient   RxID:   [id number] LOVASTATIN 40 MG  TABS (LOVASTATIN) 1 by mouth once daily  #90 x 3   Entered and Authorized by:   Corwin Levins MD   Signed by:   Corwin Levins MD on 04/11/2010   Method used:   Print then Give to Patient   RxID:   [id number]

## 2011-01-10 NOTE — Consult Note (Signed)
Summary: [facility]  [facility]   Imported By: Sherian Rein 04/22/2010 14:58:44  _____________________________________________________________________  External Attachment:    Type:   Image     Comment:   External Document

## 2012-06-06 ENCOUNTER — Other Ambulatory Visit (INDEPENDENT_AMBULATORY_CARE_PROVIDER_SITE_OTHER): Payer: Medicare Other

## 2012-06-06 ENCOUNTER — Ambulatory Visit (INDEPENDENT_AMBULATORY_CARE_PROVIDER_SITE_OTHER): Payer: Medicare Other | Admitting: Internal Medicine

## 2012-06-06 VITALS — BP 142/90 | HR 74 | Temp 97.9°F | Ht 66.0 in | Wt 199.0 lb

## 2012-06-06 DIAGNOSIS — I10 Essential (primary) hypertension: Secondary | ICD-10-CM

## 2012-06-06 DIAGNOSIS — E785 Hyperlipidemia, unspecified: Secondary | ICD-10-CM | POA: Diagnosis not present

## 2012-06-06 DIAGNOSIS — K219 Gastro-esophageal reflux disease without esophagitis: Secondary | ICD-10-CM

## 2012-06-06 DIAGNOSIS — E119 Type 2 diabetes mellitus without complications: Secondary | ICD-10-CM | POA: Diagnosis not present

## 2012-06-06 LAB — URINALYSIS, ROUTINE W REFLEX MICROSCOPIC
Bilirubin Urine: NEGATIVE
Leukocytes, UA: NEGATIVE
Nitrite: NEGATIVE
Specific Gravity, Urine: <=1.005 (ref 1.000–1.030)
Total Protein, Urine: NEGATIVE
pH: 6.5 (ref 5.0–8.0)

## 2012-06-06 LAB — CBC WITH DIFFERENTIAL/PLATELET
Eosinophils Relative: 2.2 % (ref 0.0–5.0)
HCT: 42.2 % (ref 36.0–46.0)
Hemoglobin: 13.6 g/dL (ref 12.0–15.0)
Lymphs Abs: 3.1 10*3/uL (ref 0.7–4.0)
MCV: 88 fl (ref 78.0–100.0)
Monocytes Absolute: 0.6 10*3/uL (ref 0.1–1.0)
Monocytes Relative: 8.6 % (ref 3.0–12.0)
Neutro Abs: 3.5 10*3/uL (ref 1.4–7.7)
RDW: 13.6 % (ref 11.5–14.6)
WBC: 7.4 10*3/uL (ref 4.5–10.5)

## 2012-06-06 LAB — TSH: TSH: 0.96 u[IU]/mL (ref 0.35–5.50)

## 2012-06-06 LAB — BASIC METABOLIC PANEL
Calcium: 10.1 mg/dL (ref 8.4–10.5)
GFR: 62.78 mL/min (ref >60.00)
Glucose, Bld: 96 mg/dL (ref 70–99)
Potassium: 3.6 mEq/L (ref 3.5–5.1)
Sodium: 140 mEq/L (ref 135–145)

## 2012-06-06 LAB — HEMOGLOBIN A1C: Hgb A1c MFr Bld: 6.7 % — ABNORMAL HIGH (ref 4.6–6.5)

## 2012-06-06 LAB — HEPATIC FUNCTION PANEL
AST: 26 U/L (ref 0–37)
Albumin: 4 g/dL (ref 3.5–5.2)
Alkaline Phosphatase: 69 U/L (ref 39–117)
Total Bilirubin: 0.7 mg/dL (ref 0.3–1.2)

## 2012-06-06 LAB — LIPID PANEL
Cholesterol: 144 mg/dL (ref 0–200)
VLDL: 18.8 mg/dL (ref 0.0–40.0)

## 2012-06-06 MED ORDER — GLIPIZIDE 5 MG PO TABS: 5.0000 mg | ORAL_TABLET | Freq: Every day | ORAL | Status: DC

## 2012-06-06 MED ORDER — METFORMIN HCL 500 MG PO TABS: 500.0000 mg | ORAL_TABLET | Freq: Two times a day (BID) | ORAL | Status: DC

## 2012-06-06 MED ORDER — PRAVASTATIN SODIUM 40 MG PO TABS: 40.0000 mg | ORAL_TABLET | Freq: Every day | ORAL | Status: DC

## 2012-06-06 MED ORDER — AMLODIPINE BESYLATE 5 MG PO TABS: 5.0000 mg | ORAL_TABLET | Freq: Every day | ORAL | Status: DC

## 2012-06-06 NOTE — Patient Instructions (Addendum)
OK to stop the losartan Take all new medications as prescribed  - the amlodipine 5 mg for blood pressure Continue all other medications as before You are given all of the prescriptions in hardcopy today; please consider Karin Golden to have fillled Please go to LAB in the Basement for the blood and/or urine tests to be done today You will be contacted by phone if any changes need to be made immediately.  Otherwise, you will receive a letter about your results with an explanation. Please return in 3 months, or sooner if needed

## 2012-06-07 ENCOUNTER — Encounter: Payer: Self-pay | Admitting: Internal Medicine

## 2012-06-08 ENCOUNTER — Encounter: Payer: Self-pay | Admitting: Internal Medicine

## 2012-06-08 DIAGNOSIS — Z0000 Encounter for general adult medical examination without abnormal findings: Secondary | ICD-10-CM | POA: Insufficient documentation

## 2012-06-08 DIAGNOSIS — C884 Extranodal marginal zone b-cell lymphoma of mucosa-associated lymphoid tissue (malt-lymphoma) not having achieved remission: Secondary | ICD-10-CM

## 2012-06-08 HISTORY — DX: Extranodal marginal zone b-cell lymphoma of mucosa-associated lymphoid tissue (malt-lymphoma) not having achieved remission: C88.40

## 2012-06-08 HISTORY — DX: Extranodal marginal zone B-cell lymphoma of mucosa-associated lymphoid tissue (MALT-lymphoma): C88.4

## 2012-06-08 NOTE — Assessment & Plan Note (Signed)
stable overall by hx and exam, most recent data reviewed with pt, and pt to continue medical treatment as before Lab Results  Component Value Date   WBC 7.4 06/06/2012   HGB 13.6 06/06/2012   HCT 42.2 06/06/2012   PLT 215.0 06/06/2012   GLUCOSE 96 06/06/2012   CHOL 144 06/06/2012   TRIG 94.0 06/06/2012   HDL 53.20 06/06/2012   LDLCALC 72 06/06/2012   ALT 36* 06/06/2012   AST 26 06/06/2012   NA 140 06/06/2012   K 3.6 06/06/2012   CL 103 06/06/2012   CREATININE 1.1 06/06/2012   BUN 19 06/06/2012   CO2 31 06/06/2012   TSH 0.96 06/06/2012   HGBA1C 6.7* 06/06/2012   MICROALBUR 0.7 04/11/2010

## 2012-06-08 NOTE — Assessment & Plan Note (Signed)
stable overall by hx and exam, most recent data reviewed with pt, and pt to continue medical treatment as before Lab Results  Component Value Date   HGBA1C 6.7* 06/06/2012    

## 2012-06-08 NOTE — Progress Notes (Signed)
Subjective:    Patient ID: Andrea Anthony, female    DOB: [birth year], 66 y.o.   MRN: [id number]  HPI  Here to f/u after lost to f/u for over 1 yr;  Here to f/u; overall doing ok,  Pt denies chest pain, increased sob or doe, wheezing, orthopnea, PND, increased LE swelling, palpitations, dizziness or syncope.  Pt denies new neurological symptoms such as new headache, or facial or extremity weakness or numbness   Pt denies polydipsia, polyuria, or low sugar symptoms such as weakness or confusion improved with po intake.  Pt states overall good compliance with meds, trying to follow lower cholesterol, diabetic diet, wt overall stable but little exercise however.  Has had several CBG's up to 250 recently.   Pt denies fever, wt loss, night sweats, loss of appetite, or other constitutional symptoms. Denies worsening reflux, dysphagia, abd pain, n/v, bowel change or blood.  Losartan is too expensive  Past Medical History  Diagnosis Date  . DIABETES MELLITUS, TYPE II 06/24/2007    Qualifier: Diagnosis of  By: Jonny Ruiz MD, Len Blalock   . HYPERLIPIDEMIA 06/01/2008    Qualifier: Diagnosis of  By: Jonny Ruiz MD, Obie Dredge DEFICIENCY 01/07/2008    Qualifier: Diagnosis of  By: Jonny Ruiz MD, Len Blalock   . HYPERTENSION 06/24/2007    Qualifier: Diagnosis of  By: Jonny Ruiz MD, Len Blalock   . ALLERGIC RHINITIS 01/07/2008    Qualifier: Diagnosis of  By: Jonny Ruiz MD, Len Blalock   . GERD 05/24/2009    Qualifier: Diagnosis of  By: Jonny Ruiz MD, Len Blalock   . DIVERTICULOSIS, COLON 01/07/2008    Qualifier: Diagnosis of  By: Jonny Ruiz MD, Len Blalock   . PULMONARY NODULE, RIGHT LOWER LOBE 01/09/2008    Qualifier: Diagnosis of  By: Jonny Ruiz MD, Len Blalock   . 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    Gastric, 2001   History reviewed. No pertinent past surgical history.  reports that she has never smoked. She has never used smokeless tobacco. She reports that she does not drink alcohol or use illicit drugs. family history includes Hypertension in her father. Allergies    Allergen Reactions  . Ace Inhibitors     REACTION: cough  . Sulfonamide Derivatives    Current Outpatient Prescriptions on File Prior to Visit  Medication Sig Dispense Refill  . glipiZIDE (GLUCOTROL) 5 MG tablet Take 1 tablet (5 mg total) by mouth daily.  90 tablet  3  . metFORMIN (GLUCOPHAGE) 500 MG tablet Take 1 tablet (500 mg total) by mouth 2 (two) times daily with a meal.  180 tablet  3  . pravastatin (PRAVACHOL) 40 MG tablet Take 1 tablet (40 mg total) by mouth daily.  90 tablet  3  . amLODipine (NORVASC) 5 MG tablet Take 1 tablet (5 mg total) by mouth daily.  90 tablet  3   Review of Systems Constitutional: Negative for diaphoresis and unexpected weight change.  Eyes: Negative for photophobia and visual disturbance.  Respiratory: Negative for choking and stridor.   Gastrointestinal: Negative for vomiting and blood in stool.  Genitourinary: Negative for hematuria and decreased urine volume.  Musculoskeletal: Negative for gait problem.  Skin: Negative for color change and wound.  Neurological: Negative for tremors and numbness.  Psychiatric/Behavioral: Negative for decreased concentration. The patient is not hyperactive.      Objective:   Physical Exam BP 142/90  Pulse 74  Temp 97.9 F (36.6 C) (Oral)  Ht 5\' 6"  (1.676 m)  Wt 199 lb (  90.266 kg)  BMI 32.12 kg/m2  SpO2 98% Physical Exam  VS noted Constitutional: Pt appears well-developed and well-nourished.  HENT: Head: Normocephalic.  Right Ear: External ear normal.  Left Ear: External ear normal.  Eyes: Conjunctivae and EOM are normal. Pupils are equal, round, and reactive to light.  Neck: Normal range of motion. Neck supple.  Cardiovascular: Normal rate and regular rhythm.   Pulmonary/Chest: Effort normal and breath sounds normal.  Abd:  Soft, NT, non-distended, + BS Neurological: Pt is alert. No cranial nerve deficit. motor/sens/dtr intact Skin: Skin is warm. No erythema.  Psychiatric: Pt behavior is normal.  Thought content normal.     Assessment & Plan:

## 2012-06-08 NOTE — Assessment & Plan Note (Addendum)
stable overall by hx and exam, most recent data reviewed with pt, and pt to start amlod 5 due to cost, consider later change to diovan or avapro but delcines now as has no Part D Medicare BP Readings from Last 3 Encounters:  06/06/12 142/90  04/11/10 110/72  05/24/09 134/84

## 2012-06-08 NOTE — Assessment & Plan Note (Signed)
stable overall by hx and exam, most recent data reviewed with pt, and pt to continue medical treatment as before Lab Results  Component Value Date   LDLCALC 72 06/06/2012

## 2012-08-05 ENCOUNTER — Ambulatory Visit (INDEPENDENT_AMBULATORY_CARE_PROVIDER_SITE_OTHER): Payer: Medicare Other | Admitting: Internal Medicine

## 2012-08-05 ENCOUNTER — Encounter: Payer: Self-pay | Admitting: Internal Medicine

## 2012-08-05 VITALS — BP 138/90 | HR 61 | Temp 98.2°F | Ht 67.0 in | Wt 197.5 lb

## 2012-08-05 DIAGNOSIS — M25512 Pain in left shoulder: Secondary | ICD-10-CM

## 2012-08-05 DIAGNOSIS — M25519 Pain in unspecified shoulder: Secondary | ICD-10-CM

## 2012-08-05 DIAGNOSIS — E119 Type 2 diabetes mellitus without complications: Secondary | ICD-10-CM

## 2012-08-05 DIAGNOSIS — J069 Acute upper respiratory infection, unspecified: Secondary | ICD-10-CM | POA: Diagnosis not present

## 2012-08-05 DIAGNOSIS — I10 Essential (primary) hypertension: Secondary | ICD-10-CM

## 2012-08-05 MED ORDER — AMOXICILLIN 500 MG PO CAPS: 1000.0000 mg | ORAL_CAPSULE | Freq: Two times a day (BID) | ORAL | Status: AC

## 2012-08-05 MED ORDER — GLIPIZIDE 5 MG PO TABS: 5.0000 mg | ORAL_TABLET | Freq: Every day | ORAL | Status: DC

## 2012-08-05 MED ORDER — MELOXICAM 7.5 MG PO TABS: 7.5000 mg | ORAL_TABLET | Freq: Two times a day (BID) | ORAL | Status: DC | PRN

## 2012-08-05 MED ORDER — IRBESARTAN 150 MG PO TABS: 150.0000 mg | ORAL_TABLET | Freq: Every day | ORAL | Status: DC

## 2012-08-05 MED ORDER — AMLODIPINE BESYLATE 5 MG PO TABS: 5.0000 mg | ORAL_TABLET | Freq: Every day | ORAL | Status: DC

## 2012-08-05 MED ORDER — METFORMIN HCL 500 MG PO TABS: 500.0000 mg | ORAL_TABLET | Freq: Two times a day (BID) | ORAL | Status: DC

## 2012-08-05 NOTE — Assessment & Plan Note (Signed)
Uncontrolled, to addd avapro 150,  most recent data reviewed with pt, and pt to continue medical treatment as before  BP Readings from Last 3 Encounters:  08/05/12 138/90  06/06/12 142/90  04/11/10 110/72

## 2012-08-05 NOTE — Patient Instructions (Addendum)
Take all new medications as prescribed - the anti-inflammatory for pain, the antibiotic, and the new blood pressure medicine Continue all other medications as before All of your medications were refilled today Please call if you change your mind about referral to orthopedic for the left shoulder Continue to monitor your Blood Pressure at home; your goal is to be < 140/90 OK to cancel the Sept 30 appt with me Please return in 3 mo with Lab testing done 3-5 days before

## 2012-08-05 NOTE — Progress Notes (Signed)
Subjective:    Patient ID: Andrea Anthony, female    DOB: Aug 27, 1946, 66 y.o.   MRN: [id number]  HPI  Here to f/u with daughter to translate,  BP has been persistently elevated recently up to 160/100, Pt denies chest pain, increased sob or doe, wheezing, orthopnea, PND, increased LE swelling, palpitations, dizziness or syncope.  Pt denies new neurological symptoms such as new headache, or facial or extremity weakness or numbness   Pt denies polydipsia, polyuria.  Has hx of left neck pain, but has pain mostly of left shoudler for several wks, worse to forward elev and abduct, not radicular.   Here with 3 days acute onset fever, facial pain, pressure, general weakness and malaise, and greenish d/c, with slight ST, but little to no cough Past Medical History  Diagnosis Date  . DIABETES MELLITUS, TYPE II 06/24/2007    Qualifier: Diagnosis of  By: Jonny Ruiz MD, Len Blalock   . HYPERLIPIDEMIA 06/01/2008    Qualifier: Diagnosis of  By: Jonny Ruiz MD, Obie Dredge DEFICIENCY 01/07/2008    Qualifier: Diagnosis of  By: Jonny Ruiz MD, Len Blalock   . HYPERTENSION 06/24/2007    Qualifier: Diagnosis of  By: Jonny Ruiz MD, Len Blalock   . ALLERGIC RHINITIS 01/07/2008    Qualifier: Diagnosis of  By: Jonny Ruiz MD, Len Blalock   . GERD 05/24/2009    Qualifier: Diagnosis of  By: Jonny Ruiz MD, Len Blalock   . DIVERTICULOSIS, COLON 01/07/2008    Qualifier: Diagnosis of  By: Jonny Ruiz MD, Len Blalock   . PULMONARY NODULE, RIGHT LOWER LOBE 01/09/2008    Qualifier: Diagnosis of  By: Jonny Ruiz MD, Len Blalock   . 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    Gastric, 2001   No past surgical history on file.  reports that she has never smoked. She has never used smokeless tobacco. She reports that she does not drink alcohol or use illicit drugs. family history includes Hypertension in her father. Allergies  Allergen Reactions  . Ace Inhibitors     REACTION: cough  . Sulfonamide Derivatives    Current Outpatient Prescriptions on File Prior to Visit  Medication Sig Dispense Refill  .  aspirin 81 MG tablet Take 81 mg by mouth daily.      . pravastatin (PRAVACHOL) 40 MG tablet Take 1 tablet (40 mg total) by mouth daily.  90 tablet  3  . DISCONTD: amLODipine (NORVASC) 5 MG tablet Take 1 tablet (5 mg total) by mouth daily.  90 tablet  3  . DISCONTD: glipiZIDE (GLUCOTROL) 5 MG tablet Take 1 tablet (5 mg total) by mouth daily.  90 tablet  3  . DISCONTD: metFORMIN (GLUCOPHAGE) 500 MG tablet Take 1 tablet (500 mg total) by mouth 2 (two) times daily with a meal.  180 tablet  3  . irbesartan (AVAPRO) 150 MG tablet Take 1 tablet (150 mg total) by mouth at bedtime.  90 tablet  3    Review of Systems Review of Systems  Constitutional: Negative for unexpected weight change.  HENT: Negative for drooling and tinnitus.   Eyes: Negative for photophobia and visual disturbance.  Respiratory: Negative for choking and stridor.    Genitourinary: Negative for hematuria and decreased urine volume.  Musculoskeletal: Negative for gait problem.  Skin: Negative for color change and wound.  Neurological: Negative for tremors and numbness.  Psychiatric/Behavioral: Negative for decreased concentration. The patient is not hyperactive.      Objective:   Physical Exam BP 138/90  Pulse 61  Temp  98.2 F (36.8 C) (Oral)  Ht 5\' 7"  (1.702 m)  Wt 197 lb 8 oz (89.585 kg)  BMI 30.93 kg/m2  SpO2 97% Physical Exam  VS noted, mild ill appaering Constitutional: Pt appears well-developed and well-nourished.  HENT: Head: Normocephalic.  Right Ear: External ear normal.  Left Ear: External ear normal.  Bilat tm's mild erythema.  Sinus nontender.  Pharynx mild erythema Eyes: Conjunctivae and EOM are normal. Pupils are equal, round, and reactive to light.  Neck: Normal range of motion. Neck supple.  Left shoudler diffuse tedner, with decreased ROM to forward elev and abduction  Cardiovascular: Normal rate and regular rhythm.   Pulmonary/Chest: Effort normal and breath sounds normal.  Neurological: Pt is  alert. Not confused Skin: Skin is warm. No erythema. No rash Psychiatric: Pt behavior is normal. Thought content normal.     Assessment & Plan:

## 2012-08-05 NOTE — Assessment & Plan Note (Signed)
Mild to mod, for antibx course,  to f/u any worsening symptoms or concerns 

## 2012-08-05 NOTE — Assessment & Plan Note (Signed)
stable overall by hx and exam, most recent data reviewed with pt, and pt to continue medical treatment as before Lab Results  Component Value Date   HGBA1C 6.7* 06/06/2012    

## 2012-08-05 NOTE — Assessment & Plan Note (Signed)
Mild to mod, c/w rotater cuff tendonitis, for mobic prn, declines ortho,  to f/u any worsening symptoms or concerns

## 2012-09-09 ENCOUNTER — Ambulatory Visit: Payer: Medicare Other | Admitting: Internal Medicine

## 2012-09-26 ENCOUNTER — Encounter: Payer: Self-pay | Admitting: Internal Medicine

## 2012-09-26 ENCOUNTER — Ambulatory Visit (INDEPENDENT_AMBULATORY_CARE_PROVIDER_SITE_OTHER): Payer: Medicare Other | Admitting: Internal Medicine

## 2012-09-26 VITALS — BP 110/78 | HR 84 | Temp 98.6°F | Ht 66.0 in | Wt 203.0 lb

## 2012-09-26 DIAGNOSIS — I10 Essential (primary) hypertension: Secondary | ICD-10-CM | POA: Diagnosis not present

## 2012-09-26 DIAGNOSIS — M542 Cervicalgia: Secondary | ICD-10-CM

## 2012-09-26 DIAGNOSIS — H9209 Otalgia, unspecified ear: Secondary | ICD-10-CM

## 2012-09-26 DIAGNOSIS — E119 Type 2 diabetes mellitus without complications: Secondary | ICD-10-CM

## 2012-09-26 DIAGNOSIS — H9202 Otalgia, left ear: Secondary | ICD-10-CM | POA: Insufficient documentation

## 2012-09-26 MED ORDER — PRAVASTATIN SODIUM 40 MG PO TABS: 40.0000 mg | ORAL_TABLET | Freq: Every day | ORAL | Status: DC

## 2012-09-26 MED ORDER — LEVOFLOXACIN 250 MG PO TABS: 250.0000 mg | ORAL_TABLET | Freq: Every day | ORAL | Status: DC

## 2012-09-26 MED ORDER — HYDROCODONE-HOMATROPINE 5-1.5 MG/5ML PO SYRP: 5.0000 mL | ORAL_SOLUTION | Freq: Four times a day (QID) | ORAL | Status: DC | PRN

## 2012-09-26 MED ORDER — TRAMADOL HCL 50 MG PO TABS: 50.0000 mg | ORAL_TABLET | Freq: Four times a day (QID) | ORAL | Status: DC | PRN

## 2012-09-26 NOTE — Assessment & Plan Note (Signed)
stable overall by hx and exam, most recent data reviewed with pt, and pt to continue medical treatment as before BP Readings from Last 3 Encounters:  09/26/12 110/78  08/05/12 138/90  06/06/12 142/90

## 2012-09-26 NOTE — Assessment & Plan Note (Signed)
stable overall by hx and exam, most recent data reviewed with pt, and pt to continue medical treatment as before Lab Results  Component Value Date   HGBA1C 6.7* 06/06/2012

## 2012-09-26 NOTE — Progress Notes (Signed)
Subjective:    Patient ID: Andrea Anthony, female    DOB: 23-Aug-1946, 66 y.o.   MRN: [id number]  HPI Here with daughter to interpret, as pt has limited english and first language is hatian;  History is difficult today but primary c/o is left ear area pain, mod, constant but occasionally severe for 3-4 days;  No trauma, fever, but has some ? Decreased hearing on the left, and pt states discomfort similar to that as her last visit aug 2013, better with antibx course, then worse again;  Denies worsening allergy symptoms/sinus pain or congestion, ST, but has cough worse at night;  Also with pain around the ear worse to the left neck below the ear such as eustachian valve discomfort;  Also though mentions pain to the left neck that I think may be different, intermittent with radiation to the LUE but without numbness/weakness/swelling/rash off and on, not positional, mild to mod, not better or worse with anything.  Pt denies new neurological symptoms such as new headache, or facial or extremity weakness or numbness other than above.   Pt denies polydipsia, polyuria.  Pt denies chest pain, increased sob or doe, wheezing, orthopnea, PND, increased LE swelling, palpitations, dizziness or syncope. Past Medical History  Diagnosis Date  . DIABETES MELLITUS, TYPE II 06/24/2007    Qualifier: Diagnosis of  By: Jonny Ruiz MD, Len Blalock   . HYPERLIPIDEMIA 06/01/2008    Qualifier: Diagnosis of  By: Jonny Ruiz MD, Obie Dredge DEFICIENCY 01/07/2008    Qualifier: Diagnosis of  By: Jonny Ruiz MD, Len Blalock   . HYPERTENSION 06/24/2007    Qualifier: Diagnosis of  By: Jonny Ruiz MD, Len Blalock   . ALLERGIC RHINITIS 01/07/2008    Qualifier: Diagnosis of  By: Jonny Ruiz MD, Len Blalock   . GERD 05/24/2009    Qualifier: Diagnosis of  By: Jonny Ruiz MD, Len Blalock   . DIVERTICULOSIS, COLON 01/07/2008    Qualifier: Diagnosis of  By: Jonny Ruiz MD, Len Blalock   . PULMONARY NODULE, RIGHT LOWER LOBE 01/09/2008    Qualifier: Diagnosis of  By: Jonny Ruiz MD, Len Blalock   . 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  reports that she has never smoked. She has never used smokeless tobacco. She reports that she does not drink alcohol or use illicit drugs. family history includes Hypertension in her father. Allergies  Allergen Reactions  . Ace Inhibitors     REACTION: cough  . Sulfonamide Derivatives    Review of Systems  Constitutional: Negative for diaphoresis and unexpected weight change.  HENT: Negative for tinnitus.   Eyes: Negative for photophobia and visual disturbance.  Respiratory: Negative for choking and stridor.   Gastrointestinal: Negative for vomiting and blood in stool.  Genitourinary: Negative for hematuria and decreased urine volume.  Musculoskeletal: Negative for gait problem.  Skin: Negative for color change and wound.  Neurological: Negative for tremors and numbness.  Psychiatric/Behavioral: Negative for decreased concentration. The patient is not hyperactive.       Objective:   Physical Exam BP 110/78  Pulse 84  Temp 98.6 F (37 C) (Oral)  Ht 5\' 6"  (1.676 m)  Wt 203 lb (92.08 kg)  BMI 32.76 kg/m2  SpO2 97% Physical Exam  VS noted Constitutional: Pt appears well-developed and well-nourished.  HENT: Head: Normocephalic.  Right Ear: External ear normal.  Left Ear: External ear normal. Bilat tm's mild erythema left > right,.  Sinus nontender.  Pharynx mild erythema  Eyes: Conjunctivae and EOM are normal. Pupils are equal, round, and reactive to light.  Neck: Normal range of motion. Neck supple.  Cardiovascular: Normal rate and regular rhythm.   Pulmonary/Chest: Effort normal and breath sounds normal.  Neurological: Pt is alert. Not confused , UE normal motor/sens/dtr, gait intact Skin: Skin is warm. No erythema.  Psychiatric: Pt behavior is normal. Thought content normal.     Assessment & Plan:

## 2012-09-26 NOTE — Patient Instructions (Addendum)
Take all new medications as prescribed - the antibiotic (levaquin), tramadol (for pain), and cough syrup Continue all other medications as before Please also start the pravachol for cholesterol Please call in 3-5 days if the left ear pain is not better for ENT referral Please call in 3-5 days if the left neck pain not better for orthopedic referral Please remember to sign up for My Chart at your earliest convenience, as this will be important to you in the future with finding out test results.

## 2012-09-26 NOTE — Assessment & Plan Note (Signed)
Mild to mod, for tramadol prn but I ? If neuritic pain coincidental to the left ear pain,  to f/u any worsening symptoms or concerns

## 2012-09-26 NOTE — Assessment & Plan Note (Signed)
?   Infectious vs other - Mild to mod, for antibx course,  to f/u any worsening symptoms or concerns

## 2012-10-03 ENCOUNTER — Ambulatory Visit: Payer: Medicare Other | Admitting: Internal Medicine

## 2012-10-08 ENCOUNTER — Encounter: Payer: Self-pay | Admitting: Internal Medicine

## 2012-10-08 ENCOUNTER — Other Ambulatory Visit (INDEPENDENT_AMBULATORY_CARE_PROVIDER_SITE_OTHER): Payer: Medicare Other

## 2012-10-08 ENCOUNTER — Ambulatory Visit (INDEPENDENT_AMBULATORY_CARE_PROVIDER_SITE_OTHER): Payer: Medicare Other | Admitting: Internal Medicine

## 2012-10-08 VITALS — BP 112/72 | HR 80 | Temp 98.2°F | Ht 66.0 in | Wt 200.1 lb

## 2012-10-08 DIAGNOSIS — K5900 Constipation, unspecified: Secondary | ICD-10-CM

## 2012-10-08 DIAGNOSIS — R109 Unspecified abdominal pain: Secondary | ICD-10-CM

## 2012-10-08 DIAGNOSIS — H9202 Otalgia, left ear: Secondary | ICD-10-CM

## 2012-10-08 DIAGNOSIS — H9209 Otalgia, unspecified ear: Secondary | ICD-10-CM

## 2012-10-08 DIAGNOSIS — E119 Type 2 diabetes mellitus without complications: Secondary | ICD-10-CM

## 2012-10-08 DIAGNOSIS — M519 Unspecified thoracic, thoracolumbar and lumbosacral intervertebral disc disorder: Secondary | ICD-10-CM

## 2012-10-08 DIAGNOSIS — K429 Umbilical hernia without obstruction or gangrene: Secondary | ICD-10-CM

## 2012-10-08 HISTORY — DX: Umbilical hernia without obstruction or gangrene: K42.9

## 2012-10-08 HISTORY — DX: Unspecified thoracic, thoracolumbar and lumbosacral intervertebral disc disorder: M51.9

## 2012-10-08 LAB — CBC WITH DIFFERENTIAL/PLATELET
Basophils Absolute: 0.1 10*3/uL (ref 0.0–0.1)
Eosinophils Absolute: 0.1 10*3/uL (ref 0.0–0.7)
HCT: 42.1 % (ref 36.0–46.0)
Hemoglobin: 13.6 g/dL (ref 12.0–15.0)
Lymphocytes Relative: 33.4 % (ref 12.0–46.0)
Lymphs Abs: 2.9 10*3/uL (ref 0.7–4.0)
MCHC: 32.3 g/dL (ref 30.0–36.0)
MCV: 86.3 fl (ref 78.0–100.0)
Monocytes Absolute: 0.7 10*3/uL (ref 0.1–1.0)
Neutro Abs: 5 10*3/uL (ref 1.4–7.7)
RDW: 13.7 % (ref 11.5–14.6)

## 2012-10-08 LAB — URINALYSIS, ROUTINE W REFLEX MICROSCOPIC
Bilirubin Urine: NEGATIVE
Hgb urine dipstick: NEGATIVE
Ketones, ur: NEGATIVE
Leukocytes, UA: NEGATIVE
Urobilinogen, UA: 0.2 (ref 0.0–1.0)

## 2012-10-08 LAB — BASIC METABOLIC PANEL
CO2: 31 mEq/L (ref 19–32)
Calcium: 9.7 mg/dL (ref 8.4–10.5)
Sodium: 140 mEq/L (ref 135–145)

## 2012-10-08 LAB — LIPID PANEL
HDL: 46 mg/dL (ref >39.00)
Triglycerides: 66 mg/dL (ref 0.0–149.0)
VLDL: 13.2 mg/dL (ref 0.0–40.0)

## 2012-10-08 LAB — HEPATIC FUNCTION PANEL
Alkaline Phosphatase: 85 U/L (ref 39–117)
Bilirubin, Direct: 0.2 mg/dL (ref 0.0–0.3)
Total Protein: 7.7 g/dL (ref 6.0–8.3)

## 2012-10-08 LAB — HEMOGLOBIN A1C: Hgb A1c MFr Bld: 7 % — ABNORMAL HIGH (ref 4.6–6.5)

## 2012-10-08 LAB — SEDIMENTATION RATE: Sed Rate: 21 mm/hr (ref 0–22)

## 2012-10-08 MED ORDER — PRAVASTATIN SODIUM 40 MG PO TABS: 40.0000 mg | ORAL_TABLET | Freq: Every day | ORAL | Status: DC

## 2012-10-08 MED ORDER — CEPHALEXIN 500 MG PO CAPS: 500.0000 mg | ORAL_CAPSULE | Freq: Four times a day (QID) | ORAL | Status: DC

## 2012-10-08 MED ORDER — IRBESARTAN 150 MG PO TABS: 150.0000 mg | ORAL_TABLET | Freq: Every day | ORAL | Status: DC

## 2012-10-08 NOTE — Progress Notes (Signed)
Subjective:    Patient ID: Andrea Anthony, female    DOB: 1946-08-22, 66 y.o.   MRN: [id number]  HPI  Here with daughter who is very good [place] interpreter; pt not confused but somewhat difficult historian today;  Does mention did take the levaquin last visit, and left ear pain seemed to improve, but still with recurring pain in the past week that seems to involve the left hemicranium as well.  No rash or scalp issue.  Also here with 3 days onset abd pain, at one time points to whole abdomen, mild to mod persistent, also lower abd and radiates to the back, has some nausea but no fever, chills, flank pain, vomiting, and  Denies urinary symptoms such as dysuria, frequency, urgency,or hematuria.  Has also had some recurring constipation but some ? Worse recently;  Has had EGD with Dr Marina Goodell in the past, I cant find colonoscopy and she is unsure if she had this as well.  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  Did not miss work today, just did not feel very good while there.  No fever, hx of diverticultis.  Denies worsening reflux, dysphagia,  or blood. CT abd 2008 with umbilical hernia and incidental lumbar disc dz noted. Past Medical History  Diagnosis Date  . DIABETES MELLITUS, TYPE II 06/24/2007    Qualifier: Diagnosis of  By: Jonny Ruiz MD, Len Blalock   . HYPERLIPIDEMIA 06/01/2008    Qualifier: Diagnosis of  By: Jonny Ruiz MD, Obie Dredge DEFICIENCY 01/07/2008    Qualifier: Diagnosis of  By: Jonny Ruiz MD, Len Blalock   . HYPERTENSION 06/24/2007    Qualifier: Diagnosis of  By: Jonny Ruiz MD, Len Blalock   . ALLERGIC RHINITIS 01/07/2008    Qualifier: Diagnosis of  By: Jonny Ruiz MD, Len Blalock   . GERD 05/24/2009    Qualifier: Diagnosis of  By: Jonny Ruiz MD, Len Blalock   . DIVERTICULOSIS, COLON 01/07/2008    Qualifier: Diagnosis of  By: Jonny Ruiz MD, Len Blalock   . PULMONARY NODULE, RIGHT LOWER LOBE 01/09/2008    Qualifier: Diagnosis of  By: Jonny Ruiz MD, Len Blalock   . 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   Gastric, 2001  . Umbilical hernia 10/08/2012    Fat containing on CT 2008  . Lumbar disc disease 10/08/2012    l4-5 per CT 2008   No past surgical history on file.  reports that she has never smoked. She has never used smokeless tobacco. She reports that she does not drink alcohol or use illicit drugs. family history includes Hypertension in her father. Allergies  Allergen Reactions  . Ace Inhibitors     REACTION: cough  . Sulfonamide Derivatives    Current Outpatient Prescriptions on File Prior to Visit  Medication Sig Dispense Refill  . amLODipine (NORVASC) 5 MG tablet Take 1 tablet (5 mg total) by mouth daily.  90 tablet  3  . aspirin 81 MG tablet Take 81 mg by mouth daily.      Marland Kitchen glipiZIDE (GLUCOTROL) 5 MG tablet Take 1 tablet (5 mg total) by mouth daily.  90 tablet  3  . metFORMIN (GLUCOPHAGE) 500 MG tablet Take 1 tablet (500 mg total) by mouth 2 (two) times daily with a meal.  180 tablet  3  . DISCONTD: irbesartan (AVAPRO) 150 MG tablet Take 1 tablet (150 mg total) by mouth at bedtime.  90 tablet  3  . DISCONTD: pravastatin (PRAVACHOL) 40  MG tablet Take 1 tablet (40 mg total) by mouth daily.  90 tablet  3  . HYDROcodone-homatropine (HYCODAN) 5-1.5 MG/5ML syrup Take 5 mLs by mouth every 6 (six) hours as needed for cough.  120 mL  1   Review of Systems  Constitutional: Negative for diaphoresis and unexpected weight change.  HENT: Negative for tinnitus.   Eyes: Negative for photophobia and visual disturbance.  Respiratory: Negative for choking and stridor.   Gastrointestinal: Negative for vomiting and blood in stool.  Genitourinary: Negative for hematuria and decreased urine volume.  Musculoskeletal: Negative for gait problem.  Skin: Negative for color change and wound.  Neurological: Negative for tremors and numbness.  Psychiatric/Behavioral: Negative for decreased concentration. The patient is not hyperactive.       Objective:   Physical Exam BP 112/72  Pulse 80  Temp  98.2 F (36.8 C) (Oral)  Ht 5\' 6"  (1.676 m)  Wt 200 lb 2 oz (90.776 kg)  BMI 32.30 kg/m2  SpO2 96% Physical Exam  VS noted Constitutional: Pt appears well-developed and well-nourished.  HENT: Head: Normocephalic.  Right Ear: External ear normal.  Left Ear: External ear normal.  Eyes: Conjunctivae and EOM are normal. Pupils are equal, round, and reactive to light.  Neck: Normal range of motion. Neck supple.  Cardiovascular: Normal rate and regular rhythm.   Pulmonary/Chest: Effort normal and breath sounds normal.  Abd:  Soft, NT, non-distended, + BS except ? Mild distended, and low mid abd tender Neurological: Pt is alert. Not confused , motor/gait intact Skin: Skin is warm. No erythema.  Psychiatric: Pt behavior is normal. Thought content normal.     Assessment & Plan:

## 2012-10-08 NOTE — Assessment & Plan Note (Signed)
stable overall by hx and exam, most recent data reviewed with pt, and pt to continue medical treatment as before Lab Results  Component Value Date   HGBA1C 6.7* 06/06/2012    

## 2012-10-08 NOTE — Assessment & Plan Note (Signed)
Etiology unexplained, for ENT referral

## 2012-10-08 NOTE — Assessment & Plan Note (Signed)
Ok for [employer], and stool softner, declines referral to dr Marina Goodell for colonoscopy at this time

## 2012-10-08 NOTE — Assessment & Plan Note (Addendum)
Somewhat difficult historian due to language and hx details seem to shift, but ? UTI  - for empiric cephalexin, routine labs as well as abd u/s given the above;  Prior 2008 ct with notation of umbilical hernia and L4-5 disc dz, but these do not appear symtpomatic at this time; to consider GI referral but declines for now  Note:  Total time for pt hx, exam, review of record with pt in the room, determination of diagnoses and plan for further eval and tx is > 40 min, with over 50% spent in coordination and counseling of patient

## 2012-10-08 NOTE — Patient Instructions (Addendum)
Take all new medications as prescribed - the antibiotic OK to hold off on taking the tramadol if it makes the constipation worse You should also take Colace 100 mg twice per day as needed for stool softner, and Miralax daily for laxative Please go to LAB in the Basement for the blood and/or urine tests to be done today You will be contacted by phone if any changes need to be made immediately.  Otherwise, you will receive a letter about your results with an explanation. Please remember to sign up for My Chart at your earliest convenience, as this will be important to you in the future with finding out test results. You will be contacted regarding the referral for: ENT for the recurring left ear pain Continue all other medications as before Please have the pharmacy call with any refills you may need./the avapro and prachol were refilled as you requested today  OK to cancel the Nov 25 appt unless you wish to return on that date

## 2012-10-14 ENCOUNTER — Ambulatory Visit
Admission: RE | Admit: 2012-10-14 | Discharge: 2012-10-14 | Disposition: A | Discharge status: Home / Self Care | Payer: Medicare Other | Source: Ambulatory Visit | Attending: Internal Medicine | Admitting: Internal Medicine

## 2012-10-14 ENCOUNTER — Encounter: Payer: Self-pay | Admitting: Internal Medicine

## 2012-10-14 DIAGNOSIS — R109 Unspecified abdominal pain: Secondary | ICD-10-CM

## 2012-10-17 DIAGNOSIS — L299 Pruritus, unspecified: Secondary | ICD-10-CM | POA: Diagnosis not present

## 2012-11-04 ENCOUNTER — Ambulatory Visit: Payer: Medicare Other | Admitting: Internal Medicine

## 2012-11-04 DIAGNOSIS — Z0289 Encounter for other administrative examinations: Secondary | ICD-10-CM

## 2013-03-10 ENCOUNTER — Other Ambulatory Visit (INDEPENDENT_AMBULATORY_CARE_PROVIDER_SITE_OTHER): Payer: Medicare Other

## 2013-03-10 ENCOUNTER — Encounter: Payer: Self-pay | Admitting: Internal Medicine

## 2013-03-10 ENCOUNTER — Ambulatory Visit (INDEPENDENT_AMBULATORY_CARE_PROVIDER_SITE_OTHER): Payer: Medicare Other | Admitting: Internal Medicine

## 2013-03-10 VITALS — BP 104/82 | HR 62 | Temp 97.9°F | Ht 67.0 in | Wt 201.8 lb

## 2013-03-10 DIAGNOSIS — E119 Type 2 diabetes mellitus without complications: Secondary | ICD-10-CM

## 2013-03-10 DIAGNOSIS — I10 Essential (primary) hypertension: Secondary | ICD-10-CM

## 2013-03-10 DIAGNOSIS — H538 Other visual disturbances: Secondary | ICD-10-CM | POA: Insufficient documentation

## 2013-03-10 DIAGNOSIS — R51 Headache: Secondary | ICD-10-CM | POA: Diagnosis not present

## 2013-03-10 DIAGNOSIS — R519 Headache, unspecified: Secondary | ICD-10-CM | POA: Insufficient documentation

## 2013-03-10 LAB — BASIC METABOLIC PANEL
BUN: 10 mg/dL (ref 6–23)
CO2: 29 mEq/L (ref 19–32)
Calcium: 9 mg/dL (ref 8.4–10.5)
Chloride: 101 mEq/L (ref 96–112)
Creatinine, Ser: 0.8 mg/dL (ref 0.4–1.2)
Glucose, Bld: 147 mg/dL — ABNORMAL HIGH (ref 70–99)

## 2013-03-10 LAB — HEPATIC FUNCTION PANEL
ALT: 28 U/L (ref 0–35)
Albumin: 3.9 g/dL (ref 3.5–5.2)
Total Protein: 7.5 g/dL (ref 6.0–8.3)

## 2013-03-10 LAB — LIPID PANEL
Cholesterol: 129 mg/dL (ref 0–200)
HDL: 44.4 mg/dL (ref >39.00)
Triglycerides: 73 mg/dL (ref 0.0–149.0)

## 2013-03-10 LAB — HEMOGLOBIN A1C: Hgb A1c MFr Bld: 7.2 % — ABNORMAL HIGH (ref 4.6–6.5)

## 2013-03-10 MED ORDER — PRAVASTATIN SODIUM 40 MG PO TABS: 40.0000 mg | ORAL_TABLET | Freq: Every day | ORAL | Status: DC

## 2013-03-10 MED ORDER — METFORMIN HCL 500 MG PO TABS: 500.0000 mg | ORAL_TABLET | Freq: Two times a day (BID) | ORAL | Status: DC

## 2013-03-10 MED ORDER — GLIPIZIDE 5 MG PO TABS: 5.0000 mg | ORAL_TABLET | Freq: Every day | ORAL | Status: DC

## 2013-03-10 MED ORDER — AMLODIPINE BESYLATE 5 MG PO TABS: 5.0000 mg | ORAL_TABLET | Freq: Every day | ORAL | Status: DC

## 2013-03-10 MED ORDER — IRBESARTAN 150 MG PO TABS: 150.0000 mg | ORAL_TABLET | Freq: Every day | ORAL | Status: DC

## 2013-03-10 NOTE — Patient Instructions (Addendum)
Please continue all other medications as before, and refills have been done if requested. Please have the pharmacy call with any other refills you may need. You will be contacted regarding the referral for: Head MRI (please see [facility] before leaving today), as well as opthamology Please go to the LAB in the Basement (turn left off the elevator) for the tests to be done today You will be contacted by phone if any changes need to be made immediately.  Otherwise, you will receive a letter about your results with an explanation Please remember to sign up for My Chart if you have not done so, as this will be important to you in the future with finding out test results, communicating by private email, and scheduling acute appointments online when needed. Please return in 6 months, or sooner if needed

## 2013-03-10 NOTE — Assessment & Plan Note (Signed)
BP Readings from Last 3 Encounters:  03/10/13 104/82  10/08/12 112/72  09/26/12 110/78   stable overall by history and exam, recent data reviewed with pt, and pt to continue medical treatment as before,  to f/u any worsening symptoms or concerns

## 2013-03-10 NOTE — Assessment & Plan Note (Addendum)
Chronic daily, seems benign by hx and neuro exam ok, but has had no prior imaging, with risk factors of age, prior malignancy, and possibly worsening blurred vision - for Head MRI  Note:  Total time for pt hx, exam, review of record with pt in the room, determination of diagnoses and plan for further eval and tx is > 40 min, with over 50% spent in coordination and counseling of patient; required more time due to language barrier

## 2013-03-10 NOTE — Assessment & Plan Note (Signed)
stable overall by history and exam, recent data reviewed with pt, and pt to continue medical treatment as before,  to f/u any worsening symptoms or concerns Lab Results  Component Value Date   HGBA1C 7.0* 10/08/2012

## 2013-03-10 NOTE — Progress Notes (Signed)
Subjective:    Patient ID: Andrea Anthony, female    DOB: 1946-07-16, 67 y.o.   MRN: [id number]  HPI  Here to f/u with daughter to translate, spent 1 mo home in [place] (dec 2013).  Here to f/u; overall doing ok,  Pt denies chest pain, increased sob or doe, wheezing, orthopnea, PND, increased LE swelling, palpitations, dizziness or syncope.  Pt denies polydipsia, polyuria, or low sugar symptoms such as weakness or confusion improved with po intake.  Pt denies new neurological symptoms such as new headache, or facial or extremity weakness or numbness.   Pt states overall good compliance with meds, has been trying to follow lower cholesterol, diabetic diet, with wt overall stable,  but little exercise however. CBG's have been 130-200.  Pt continues to have recurring .lower neck pain with known hx cervical disease and offered a rather involved c-spine surgury involving ant and post approach with 3-4 mo rehab (possibly most in the hospital) , but  No bowel or bladder change, fever, wt loss,  worsening LE pain/numbness/weakness, gait change or falls. Also with pain to just above the elbow with flexion of the third finger right hand.   Today c/o HA for at least one yr, bitemporal and sometimes whole head, intermittent, lasts 1-2 hrs, Usually improved with taking a shower and tylenol; sometimes assoc with blurred vision (happens sometimes without HA, sometimes assoc with higher blood sugar) and nausea.  Has stopped driving by pulling over with the blurry vision when happens.  Does not seem related to neck pain   Past Medical History  Diagnosis Date  . DIABETES MELLITUS, TYPE II 06/24/2007    Qualifier: Diagnosis of  By: Jonny Ruiz MD, Len Blalock   . HYPERLIPIDEMIA 06/01/2008    Qualifier: Diagnosis of  By: Jonny Ruiz MD, Obie Dredge DEFICIENCY 01/07/2008    Qualifier: Diagnosis of  By: Jonny Ruiz MD, Len Blalock   . HYPERTENSION 06/24/2007    Qualifier: Diagnosis of  By: Jonny Ruiz MD, Len Blalock   . ALLERGIC RHINITIS 01/07/2008     Qualifier: Diagnosis of  By: Jonny Ruiz MD, Len Blalock   . GERD 05/24/2009    Qualifier: Diagnosis of  By: Jonny Ruiz MD, Len Blalock   . DIVERTICULOSIS, COLON 01/07/2008    Qualifier: Diagnosis of  By: Jonny Ruiz MD, Len Blalock   . PULMONARY NODULE, RIGHT LOWER LOBE 01/09/2008    Qualifier: Diagnosis of  By: Jonny Ruiz MD, Len Blalock   . 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 Umbilical hernia 10/08/2012    Fat containing on CT 2008  . Lumbar disc disease 10/08/2012    l4-5 per CT 2008   No past surgical history on file.  reports that she has never smoked. She has never used smokeless tobacco. She reports that she does not drink alcohol or use illicit drugs. family history includes Hypertension in her father. Allergies  Allergen Reactions  . Ace Inhibitors     REACTION: cough  . Sulfonamide Derivatives    Current Outpatient Prescriptions on File Prior to Visit  Medication Sig Dispense Refill  . aspirin 81 MG tablet Take 81 mg by mouth daily.       No current facility-administered medications on file prior to visit.   Review of Systems  Constitutional: Negative for unexpected weight change, or unusual diaphoresis  HENT: Negative for tinnitus.   Eyes: Negative for photophobia and visual disturbance.  Respiratory: Negative for choking and stridor.   Gastrointestinal: Negative for vomiting and  blood in stool.  Genitourinary: Negative for hematuria and decreased urine volume.  Musculoskeletal: Negative for acute joint swelling Skin: Negative for color change and wound.  Neurological: Negative for tremors and numbness other than noted  Psychiatric/Behavioral: Negative for decreased concentration or  hyperactivity.       Objective:   Physical Exam BP 104/82  Pulse 62  Temp(Src) 97.9 F (36.6 C) (Oral)  Ht 5\' 7"  (1.702 m)  Wt 201 lb 12 oz (91.513 kg)  BMI 31.59 kg/m2  SpO2 99% VS noted,  Constitutional: Pt appears well-developed and well-nourished.  HENT: Head: NCAT.  Right Ear: External ear normal.   Left Ear: External ear normal.  Eyes: Conjunctivae and EOM are normal. Pupils are equal, round, and reactive to light.  Neck: Normal range of motion. Neck supple.  Cardiovascular: Normal rate and regular rhythm.   Pulmonary/Chest: Effort normal and breath sounds normal.  Abd:  Soft, NT, non-distended, + BS Neurological: Pt is alert. Not confused, cn 2-12 intact, visual fields intact, motor/dtr intact  Skin: Skin is warm. No erythema.  Psychiatric: Pt behavior is normal. Thought content normal. 1+ nervous    Assessment & Plan:

## 2013-03-10 NOTE — Assessment & Plan Note (Signed)
?   Benign etiology, vs sugar vs retinal vs migraine vs other, has not seen optho in yrs, will refer

## 2013-03-13 ENCOUNTER — Encounter: Payer: Self-pay | Admitting: Internal Medicine

## 2013-03-13 DIAGNOSIS — H2510 Age-related nuclear cataract, unspecified eye: Secondary | ICD-10-CM | POA: Diagnosis not present

## 2013-03-13 DIAGNOSIS — E109 Type 1 diabetes mellitus without complications: Secondary | ICD-10-CM | POA: Diagnosis not present

## 2013-03-17 ENCOUNTER — Other Ambulatory Visit: Payer: Self-pay | Admitting: Internal Medicine

## 2013-03-17 ENCOUNTER — Encounter: Payer: Self-pay | Admitting: Internal Medicine

## 2013-03-17 ENCOUNTER — Ambulatory Visit
Admission: RE | Admit: 2013-03-17 | Discharge: 2013-03-17 | Disposition: A | Discharge status: Home / Self Care | Payer: Medicare Other | Source: Ambulatory Visit | Attending: Internal Medicine | Admitting: Internal Medicine

## 2013-03-17 DIAGNOSIS — E236 Other disorders of pituitary gland: Secondary | ICD-10-CM

## 2013-03-17 DIAGNOSIS — R51 Headache: Secondary | ICD-10-CM | POA: Diagnosis not present

## 2013-03-17 DIAGNOSIS — H538 Other visual disturbances: Secondary | ICD-10-CM

## 2013-03-17 HISTORY — DX: Other disorders of pituitary gland: E23.6

## 2013-04-07 ENCOUNTER — Encounter: Payer: Self-pay | Admitting: Internal Medicine

## 2013-04-07 ENCOUNTER — Ambulatory Visit (INDEPENDENT_AMBULATORY_CARE_PROVIDER_SITE_OTHER): Payer: Medicare Other | Admitting: Internal Medicine

## 2013-04-07 VITALS — BP 118/70 | HR 80 | Temp 98.6°F | Resp 12 | Ht 66.0 in | Wt 203.0 lb

## 2013-04-07 DIAGNOSIS — R51 Headache: Secondary | ICD-10-CM | POA: Diagnosis not present

## 2013-04-07 DIAGNOSIS — E236 Other disorders of pituitary gland: Secondary | ICD-10-CM | POA: Diagnosis not present

## 2013-04-07 DIAGNOSIS — R5381 Other malaise: Secondary | ICD-10-CM

## 2013-04-07 DIAGNOSIS — R5383 Other fatigue: Secondary | ICD-10-CM

## 2013-04-07 DIAGNOSIS — E237 Disorder of pituitary gland, unspecified: Secondary | ICD-10-CM

## 2013-04-07 NOTE — Progress Notes (Signed)
Subjective:     Patient ID: Andrea Anthony, female   DOB: 08/23/1946, 67 y.o.   MRN: [id number]  HPI Andrea Anthony is a [place] woman referred by her PCP, Dr. Jonny Ruiz, for evaluation for empty sella, found on a recent MRI obtained for headaches, dizziness, and blurred vision.  Patient has had headaches for >1 year; these are are bitemporal; intermittent; lasting 1-2 hours; occur when she works and is more tired; they improve with a shower, Tylenol, sleep; sometimes associated with blurry vision (but she can have blurry vision outside headache), and also with nausea. The HAs are better lately, does not have them as often, there might be weeks between episodes. Nausea is connected with her sugars: both high and low sugars can trigger it. HAs are not usually associated with high or low sugars.  At her last visit with her PCP, she complained of HAs, but also of dizziness and blurry vision, so he ordered an MRI also in the light of her previous malignancy (MALT lymphoma). This showed an empty sella, without any other pituitary mass. I reviewed her MRI images, and it does appear that she has a good amount of pituitary tissue, despite the fact that this is pushed on the margins of the sella turcica. There is also a strong posterior pituitary bright spot.  Patient denies symptoms of DI (increased thirst and urination), she also denies symptoms of CSF rhinorrhea. She denies signs of adrenal insufficiency (weight loss, nausea - except when sugars abnormal, vomiting, abdominal pain). She also does not appear to have hypothyroidism.  Labs obtained 10 months ago showed a TSH of 0.96, and per review of her chart, her TSH levels going back 5 years were always normal. There are no free T4 levels Ten months ago, her BMP was normal, with an ALT slightly elevated at 36 (less than 35), normal CBC.  PMH: Patient also has a history of DM2, with the last hemoglobin A1c of 7% (09/2012) - she tells me she feels poorly when  sugars 80s or if 160-200; also has hyperlipidemia; hypertension; anemia; gastric MALT lymphoma-diagnosed 2001; GERD; DDD.  Review of Systems Constitutional: no weight gain/loss, no fatigue, no subjective hyperthermia/hypothermia Eyes: + blurry vision, no xerophthalmia  ENT: + sore throat, no nodules palpated in throat, no dysphagia/odynophagia, no hoarseness; + tinnitus Cardiovascular: no CP/SOB/palpitations/swelling in feet Respiratory: no cough/SOB Gastrointestinal: no N/V/D/+C/+heartburn Musculoskeletal: + muscle/+ joint aches Skin: no rashes; + hair loss Neurological: no tremors/numbness/tingling/dizziness Psychiatric: no depression/anxiety  History reviewed. No pertinent past surgical history.  History   Social History  . Marital Status: Married    Spouse Name: N/A    Number of Children: 7   Occupational History  . preper (?)   Social History Main Topics  . Smoking status: Never Smoker   . Smokeless tobacco: Never Used  . Alcohol Use: No  . Drug Use: No  . Sexually Active:    Current Outpatient Prescriptions on File Prior to Visit  Medication Sig Dispense Refill  . amLODipine (NORVASC) 5 MG tablet Take 1 tablet (5 mg total) by mouth daily.  90 tablet  3  . aspirin 81 MG tablet Take 81 mg by mouth daily.      Marland Kitchen glipiZIDE (GLUCOTROL) 5 MG tablet Take 1 tablet (5 mg total) by mouth daily.  90 tablet  3  . irbesartan (AVAPRO) 150 MG tablet Take 1 tablet (150 mg total) by mouth at bedtime.  90 tablet  3  . metFORMIN (GLUCOPHAGE) 500 MG tablet  Take 1 tablet (500 mg total) by mouth 2 (two) times daily with a meal.  180 tablet  3  . pravastatin (PRAVACHOL) 40 MG tablet Take 1 tablet (40 mg total) by mouth daily.  90 tablet  3   No current facility-administered medications on file prior to visit.   Allergies  Allergen Reactions  . Ace Inhibitors     REACTION: cough  . Sulfonamide Derivatives    Family History  Problem Relation Age of Onset  . Hypertension Father     Objective:   Physical Exam BP 118/70  Pulse 80  Temp(Src) 98.6 F (37 C) (Oral)  Resp 12  Ht 5\' 6"  (1.676 m)  Wt 203 lb (92.08 kg)  BMI 32.78 kg/m2  SpO2 96% Wt Readings from Last 3 Encounters:  04/07/13 203 lb (92.08 kg)  03/10/13 201 lb 12 oz (91.513 kg)  10/08/12 200 lb 2 oz (90.776 kg)  Constitutional: overweight, in NAD Eyes: PERRLA, EOMI, no exophthalmos ENT: moist mucous membranes, no thyromegaly, no cervical lymphadenopathy Cardiovascular: RRR, No MRG, bilateral pitting edema legs and periankle edema Respiratory: CTA B Gastrointestinal: abdomen soft, NT, ND, BS+ Musculoskeletal: no deformities, strength intact in all 4 Skin: moist, warm, no rashes Neurological: no tremor with outstretched hands, DTR normal in all 4; Visual fields intact by confrontation  Assessment:     1. Primary empty sella     Plan:     Upon questioning, patient and her daughter were not completely sure why they were here to see me, so we reviewed the images of her recent brain MRI together and I explained the nature of the problem. I also explained the importance of the pituitary gland and the fact that this is pushed on the side of the sella, hence the misnomer "empty sella". We discussed about the fact that we need to make sure that her pituitary is functioning right, as this is the endocrine controller in the body.  - I will check her pituitary-adrenal and pituitary-thyroid axes, to make sure that she does not need replacement. I do not feel necessary to check her prolactin, IGF 1, or her pituitary-ovarian function, since this would not change our management. - since the patient does not have any pituitary masses, it is very unlikely that her empty sella is secondary to a previous pituitary tumor, therefore, it can be qualified as primary empty sella. This is rarely associated with pituitary abnormalities, but we do need to make sure that her adrenal and pituitary function is normal. - I advised  the patient to return to have labs drawn at 8 AM, and I will let her know the results. If the labs are normal, no intervention is necessary.  - I will not schedule a future appointment for the patient, but I would be glad to see her back if the need arises in the future  Component     Latest Ref Rng 04/10/2013  Cortisol, Plasma      12.7  C206 ACTH     10 - 46 pg/mL 45  TSH     0.35 - 5.50 uIU/mL 1.44  Free T4     0.60 - 1.60 ng/dL 0.27  T3, Free     2.3 - 4.2 pg/mL 3.0   No hormonal imbalance.

## 2013-04-07 NOTE — Patient Instructions (Signed)
I will send you a letter with the results of your labs.

## 2013-04-10 ENCOUNTER — Other Ambulatory Visit (INDEPENDENT_AMBULATORY_CARE_PROVIDER_SITE_OTHER): Payer: Medicare Other

## 2013-04-10 DIAGNOSIS — R51 Headache: Secondary | ICD-10-CM

## 2013-04-10 DIAGNOSIS — E236 Other disorders of pituitary gland: Secondary | ICD-10-CM | POA: Diagnosis not present

## 2013-04-10 DIAGNOSIS — R5383 Other fatigue: Secondary | ICD-10-CM

## 2013-04-10 DIAGNOSIS — R5381 Other malaise: Secondary | ICD-10-CM

## 2013-04-10 DIAGNOSIS — E237 Disorder of pituitary gland, unspecified: Secondary | ICD-10-CM

## 2013-04-10 LAB — CORTISOL: Cortisol, Plasma: 12.7 ug/dL

## 2013-04-10 LAB — T3, FREE: T3, Free: 3 pg/mL (ref 2.3–4.2)

## 2013-04-10 LAB — T4, FREE: Free T4: 0.88 ng/dL (ref 0.60–1.60)

## 2013-04-11 ENCOUNTER — Encounter: Payer: Self-pay | Admitting: Internal Medicine

## 2013-04-11 LAB — ACTH: C206 ACTH: 45 pg/mL (ref 10–46)

## 2013-05-15 ENCOUNTER — Telehealth: Payer: Self-pay

## 2013-05-15 NOTE — Telephone Encounter (Signed)
Informed the patient

## 2013-05-15 NOTE — Telephone Encounter (Signed)
Can try otc tylenol or alleve otc prn, but if persists would o/w need appt

## 2013-05-15 NOTE — Telephone Encounter (Signed)
The patients son to inform the patient is having right arm pain and would like to know what to do or take.

## 2013-09-15 ENCOUNTER — Ambulatory Visit: Payer: Medicare Other | Admitting: Internal Medicine

## 2013-09-16 ENCOUNTER — Encounter: Payer: Self-pay | Admitting: Internal Medicine

## 2013-09-16 ENCOUNTER — Ambulatory Visit (INDEPENDENT_AMBULATORY_CARE_PROVIDER_SITE_OTHER): Payer: Medicare Other | Admitting: Internal Medicine

## 2013-09-16 ENCOUNTER — Other Ambulatory Visit (INDEPENDENT_AMBULATORY_CARE_PROVIDER_SITE_OTHER): Payer: Medicare Other

## 2013-09-16 VITALS — BP 110/80 | HR 66 | Temp 97.6°F | Ht 66.5 in | Wt 196.0 lb

## 2013-09-16 DIAGNOSIS — E119 Type 2 diabetes mellitus without complications: Secondary | ICD-10-CM

## 2013-09-16 DIAGNOSIS — I10 Essential (primary) hypertension: Secondary | ICD-10-CM

## 2013-09-16 DIAGNOSIS — E785 Hyperlipidemia, unspecified: Secondary | ICD-10-CM

## 2013-09-16 DIAGNOSIS — Z0000 Encounter for general adult medical examination without abnormal findings: Secondary | ICD-10-CM

## 2013-09-16 DIAGNOSIS — Z23 Encounter for immunization: Secondary | ICD-10-CM

## 2013-09-16 DIAGNOSIS — M542 Cervicalgia: Secondary | ICD-10-CM

## 2013-09-16 LAB — MICROALBUMIN / CREATININE URINE RATIO
Creatinine,U: 115.2 mg/dL
Microalb Creat Ratio: 0.7 mg/g (ref 0.0–30.0)

## 2013-09-16 LAB — CBC WITH DIFFERENTIAL/PLATELET
Basophils Absolute: 0 10*3/uL (ref 0.0–0.1)
Eosinophils Absolute: 0.1 10*3/uL (ref 0.0–0.7)
Hemoglobin: 13.3 g/dL (ref 12.0–15.0)
Lymphocytes Relative: 48.1 % — ABNORMAL HIGH (ref 12.0–46.0)
MCHC: 33.3 g/dL (ref 30.0–36.0)
Monocytes Absolute: 0.4 10*3/uL (ref 0.1–1.0)
Neutro Abs: 2.2 10*3/uL (ref 1.4–7.7)
RDW: 13.4 % (ref 11.5–14.6)

## 2013-09-16 LAB — URINALYSIS, ROUTINE W REFLEX MICROSCOPIC
Bilirubin Urine: NEGATIVE
Hgb urine dipstick: NEGATIVE
Ketones, ur: NEGATIVE
Urine Glucose: NEGATIVE
Urobilinogen, UA: 0.2 (ref 0.0–1.0)

## 2013-09-16 LAB — HEPATIC FUNCTION PANEL
ALT: 28 U/L (ref 0–35)
Alkaline Phosphatase: 69 U/L (ref 39–117)
Bilirubin, Direct: 0.1 mg/dL (ref 0.0–0.3)
Total Protein: 7.4 g/dL (ref 6.0–8.3)

## 2013-09-16 LAB — BASIC METABOLIC PANEL
CO2: 27 mEq/L (ref 19–32)
Chloride: 107 mEq/L (ref 96–112)
Sodium: 140 mEq/L (ref 135–145)

## 2013-09-16 LAB — LIPID PANEL: Total CHOL/HDL Ratio: 2

## 2013-09-16 MED ORDER — AMLODIPINE BESYLATE 5 MG PO TABS: 5.0000 mg | ORAL_TABLET | Freq: Every day | ORAL | Status: DC

## 2013-09-16 MED ORDER — MELOXICAM 15 MG PO TABS: 15.0000 mg | ORAL_TABLET | Freq: Every day | ORAL | Status: DC | PRN

## 2013-09-16 NOTE — Assessment & Plan Note (Signed)
stable overall by history and exam, recent data reviewed with pt, and pt to continue medical treatment as before,  to f/u any worsening symptoms or concerns Lab Results  Component Value Date   HGBA1C 7.2* 03/10/2013

## 2013-09-16 NOTE — Assessment & Plan Note (Signed)
stable overall by history and exam, recent data reviewed with pt, and pt to continue medical treatment as before,  to f/u any worsening symptoms or concerns BP Readings from Last 3 Encounters:  09/16/13 110/80  04/07/13 118/70  03/10/13 104/82

## 2013-09-16 NOTE — Assessment & Plan Note (Addendum)
Suspect due to underlying c-spine djd and /or ddd, ? Neuritic pain vs referred pain, and +/- right lateral epicondylar tender as well; overall mild, doesnot want more eval such as MRI ro EMG; for nsaid prn,  to f/u any worsening symptoms or concerns Note:  Total time for pt hx, exam, review of record with pt in the room, determination of diagnoses and plan for further eval and tx is > 40 min, with over 50% spent in coordination and counseling of patient

## 2013-09-16 NOTE — Assessment & Plan Note (Signed)
stable overall by history and exam, recent data reviewed with pt, and pt to continue medical treatment as before,  to f/u any worsening symptoms or concerns Lab Results  Component Value Date   LDLCALC 70 03/10/2013

## 2013-09-16 NOTE — Patient Instructions (Addendum)
You had the flu shot today, and the Prevnar pneumonia shot Please remember to followup with your GYN for the yearly pap smear and/or mammogram  (consider [facility] for the pap, and mammogram at [facility] on [address]) You will be contacted regarding the referral for: colonoscopy Please go to the LAB in the Basement (turn left off the elevator) for the tests to be done today You will be contacted by phone if any changes need to be made immediately.  Otherwise, you will receive a letter about your results with an explanation, but please check with MyChart first.  Please return in 6 months, or sooner if needed

## 2013-09-16 NOTE — Addendum Note (Signed)
Addended by: Scharlene Gloss B on: 09/16/2013 10:24 AM   Modules accepted: Orders

## 2013-09-16 NOTE — Progress Notes (Addendum)
Subjective:    Patient ID: Andrea Anthony, female    DOB: 07/16/1946, 67 y.o.   MRN: [id number]  HPI   Here to f/u; overall doing ok,  Pt denies chest pain, increased sob or doe, wheezing, orthopnea, PND, increased LE swelling, palpitations, dizziness or syncope.  Pt denies polydipsia, polyuria, or low sugar symptoms such as weakness or confusion improved with po intake.  Pt denies new neurological symptoms such as new headache, or facial or extremity weakness or numbness.   Pt states overall good compliance with meds, has been trying to follow lower cholesterol, diabetic diet, with wt overall stable,  but little exercise however.  Recent hormone eval for empty sella neg per endo.  Has not wanted colonscopy in past, now ok to be referred.  Has not had regular pap or mammo but now willing. Due for flu shot.  Also has right neck pain, sharp and dull, worse to extend neck, with some radaition to right upper back, right shoulder and possible RLE to elbow though has what sounds like some tender to right lateral elbow as well, that has slowed her ability to use the arm recently.  Dughater with her translates for her Past Medical History  Diagnosis Date  . DIABETES MELLITUS, TYPE II 06/24/2007    Qualifier: Diagnosis of  By: Jonny Ruiz MD, Len Blalock   . HYPERLIPIDEMIA 06/01/2008    Qualifier: Diagnosis of  By: Jonny Ruiz MD, Obie Dredge DEFICIENCY 01/07/2008    Qualifier: Diagnosis of  By: Jonny Ruiz MD, Len Blalock   . HYPERTENSION 06/24/2007    Qualifier: Diagnosis of  By: Jonny Ruiz MD, Len Blalock   . ALLERGIC RHINITIS 01/07/2008    Qualifier: Diagnosis of  By: Jonny Ruiz MD, Len Blalock   . GERD 05/24/2009    Qualifier: Diagnosis of  By: Jonny Ruiz MD, Len Blalock   . DIVERTICULOSIS, COLON 01/07/2008    Qualifier: Diagnosis of  By: Jonny Ruiz MD, Len Blalock   . PULMONARY NODULE, RIGHT LOWER LOBE 01/09/2008    Qualifier: Diagnosis of  By: Jonny Ruiz MD, Len Blalock   . 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 Umbilical hernia 10/08/2012    Fat containing  on CT 2008  . Lumbar disc disease 10/08/2012    l4-5 per CT 2008  . Empty sella 03/17/2013   No past surgical history on file.  reports that she has never smoked. She has never used smokeless tobacco. She reports that she does not drink alcohol or use illicit drugs. family history includes Hypertension in her father. Allergies  Allergen Reactions  . Ace Inhibitors     REACTION: cough  . Sulfonamide Derivatives    Current Outpatient Prescriptions on File Prior to Visit  Medication Sig Dispense Refill  . aspirin 81 MG tablet Take 81 mg by mouth daily.      Marland Kitchen glipiZIDE (GLUCOTROL) 5 MG tablet Take 1 tablet (5 mg total) by mouth daily.  90 tablet  3  . irbesartan (AVAPRO) 150 MG tablet Take 1 tablet (150 mg total) by mouth at bedtime.  90 tablet  3  . metFORMIN (GLUCOPHAGE) 500 MG tablet Take 1 tablet (500 mg total) by mouth 2 (two) times daily with a meal.  180 tablet  3  . pravastatin (PRAVACHOL) 40 MG tablet Take 1 tablet (40 mg total) by mouth daily.  90 tablet  3   No current facility-administered medications on file prior to visit.   Review of Systems  Constitutional: Negative for  unexpected weight change, or unusual diaphoresis  HENT: Negative for tinnitus.   Eyes: Negative for photophobia and visual disturbance.  Respiratory: Negative for choking and stridor.   Gastrointestinal: Negative for vomiting and blood in stool.  Genitourinary: Negative for hematuria and decreased urine volume.  Musculoskeletal: Negative for acute joint swelling Skin: Negative for color change and wound.  Neurological: Negative for tremors and numbness other than noted  Psychiatric/Behavioral: Negative for decreased concentration or  hyperactivity.       Objective:   Physical Exam BP 110/80  Pulse 66  Temp(Src) 97.6 F (36.4 C) (Oral)  Ht 5' 6.5" (1.689 m)  Wt 196 lb (88.905 kg)  BMI 31.16 kg/m2  SpO2 97% VS noted,  Constitutional: Pt appears well-developed and well-nourished.  HENT: Head:  NCAT.  Right Ear: External ear normal.  Left Ear: External ear normal.  Eyes: Conjunctivae and EOM are normal. Pupils are equal, round, and reactive to light.  Neck: Normal range of motion. Neck supple.  Cardiovascular: Normal rate and regular rhythm.   Pulmonary/Chest: Effort normal and breath sounds normal.  Abd:  Soft, NT, non-distended, + BS Neurological: Pt is alert. Not confused . Motor/sens/dtr intact to UEs; gait intact Spine wtihout tender or paravertebral or trapezoid tender Right shoulder NT with FROM Has some tender to right lateral epicondyle Skin: Skin is warm. No erythema.  Psychiatric: Pt behavior is normal. Thought content normal.     Assessment & Plan:  Quality Measures addressed:  Mammogram:  pt declines and will self-refer Colorectal Cancer screening: pt declines all at this time, including FOBT, flex sig, colonoscopy

## 2013-11-12 ENCOUNTER — Encounter: Payer: Self-pay | Admitting: Internal Medicine

## 2014-01-13 ENCOUNTER — Other Ambulatory Visit: Payer: Self-pay

## 2014-01-13 MED ORDER — PRAVASTATIN SODIUM 40 MG PO TABS: 40.0000 mg | ORAL_TABLET | Freq: Every day | ORAL | Status: DC

## 2014-01-13 MED ORDER — GLIPIZIDE 5 MG PO TABS: 5.0000 mg | ORAL_TABLET | Freq: Every day | ORAL | Status: DC

## 2014-01-13 MED ORDER — METFORMIN HCL 500 MG PO TABS: 500.0000 mg | ORAL_TABLET | Freq: Two times a day (BID) | ORAL | Status: DC

## 2014-02-09 ENCOUNTER — Other Ambulatory Visit: Payer: Self-pay | Admitting: Internal Medicine

## 2014-03-14 ENCOUNTER — Other Ambulatory Visit: Payer: Self-pay | Admitting: Internal Medicine

## 2014-03-16 ENCOUNTER — Telehealth: Payer: Self-pay | Admitting: Internal Medicine

## 2014-03-16 NOTE — Telephone Encounter (Signed)
Patient tried to get refills at pharmacy. . . . The patient does not speak english and is confused on meds.  Please give patient a call.

## 2014-03-17 ENCOUNTER — Ambulatory Visit: Payer: Medicare Other | Admitting: Internal Medicine

## 2014-03-17 MED ORDER — AMLODIPINE BESYLATE 5 MG PO TABS: 5.0000 mg | ORAL_TABLET | Freq: Every day | ORAL | Status: DC

## 2014-03-17 MED ORDER — METFORMIN HCL 500 MG PO TABS: 500.0000 mg | ORAL_TABLET | Freq: Two times a day (BID) | ORAL | Status: DC

## 2014-03-17 MED ORDER — GLIPIZIDE 5 MG PO TABS: 5.0000 mg | ORAL_TABLET | Freq: Every day | ORAL | Status: DC

## 2014-03-17 MED ORDER — PRAVASTATIN SODIUM 40 MG PO TABS: 40.0000 mg | ORAL_TABLET | Freq: Every day | ORAL | Status: DC

## 2014-03-17 MED ORDER — IRBESARTAN 150 MG PO TABS: 150.0000 mg | ORAL_TABLET | Freq: Every day | ORAL | Status: DC

## 2014-03-17 NOTE — Telephone Encounter (Signed)
Spoke to the patients daughter and sent in all maintenance medications.

## 2014-03-24 ENCOUNTER — Ambulatory Visit (INDEPENDENT_AMBULATORY_CARE_PROVIDER_SITE_OTHER): Payer: Medicare Other | Admitting: Internal Medicine

## 2014-03-24 ENCOUNTER — Encounter: Payer: Self-pay | Admitting: Internal Medicine

## 2014-03-24 VITALS — BP 108/78 | HR 73 | Temp 98.4°F | Ht 66.0 in | Wt 194.0 lb

## 2014-03-24 DIAGNOSIS — E785 Hyperlipidemia, unspecified: Secondary | ICD-10-CM

## 2014-03-24 DIAGNOSIS — I10 Essential (primary) hypertension: Secondary | ICD-10-CM | POA: Diagnosis not present

## 2014-03-24 DIAGNOSIS — E119 Type 2 diabetes mellitus without complications: Secondary | ICD-10-CM

## 2014-03-24 NOTE — Progress Notes (Signed)
Pre visit review using our clinic review tool, if applicable. No additional management support is needed unless otherwise documented below in the visit note. 

## 2014-03-24 NOTE — Patient Instructions (Signed)
.  Please continue all other medications as before, and refills have been done if requested. Please have the pharmacy call with any other refills you may need.  Please continue your efforts at being more active, low cholesterol diet, and weight control.  Please keep your appointments with your specialists as you have planned - your eye doctor once per day  Please go to the LAB in the Basement (turn left off the elevator) for the tests to be done tomorrow  You will be contacted by phone if any changes need to be made immediately.  Otherwise, you will receive a letter about your results with an explanation, but please check with MyChart first.   Please return in 6 months, or sooner if needed

## 2014-03-24 NOTE — Progress Notes (Signed)
Subjective:    Patient ID: Andrea Anthony, female    DOB: 1946/03/07, 68 y.o.   MRN: [id number]  HPI  Here to f/u; overall doing ok,  Pt denies chest pain, increased sob or doe, wheezing, orthopnea, PND, increased LE swelling, palpitations, dizziness or syncope.  Pt denies polydipsia, polyuria, or low sugar symptoms such as weakness or confusion improved with po intake.  Pt denies new neurological symptoms such as new headache, or facial or extremity weakness or numbness.   Pt states overall good compliance with meds, has been trying to follow lower cholesterol, diabetic diet, with wt overall stable,  but little exercise however. No current complaints Past Medical History  Diagnosis Date  . DIABETES MELLITUS, TYPE II 06/24/2007    Qualifier: Diagnosis of  By: Jenny Reichmann MD, Hunt Oris   . HYPERLIPIDEMIA 06/01/2008    Qualifier: Diagnosis of  By: Jenny Reichmann MD, Shelbie Hutching DEFICIENCY 01/07/2008    Qualifier: Diagnosis of  By: Jenny Reichmann MD, Hunt Oris   . HYPERTENSION 06/24/2007    Qualifier: Diagnosis of  By: Jenny Reichmann MD, Hunt Oris   . ALLERGIC RHINITIS 01/07/2008    Qualifier: Diagnosis of  By: Jenny Reichmann MD, Hunt Oris   . GERD 05/24/2009    Qualifier: Diagnosis of  By: Jenny Reichmann MD, Hunt Oris   . DIVERTICULOSIS, COLON 01/07/2008    Qualifier: Diagnosis of  By: Jenny Reichmann MD, Hunt Oris   . PULMONARY NODULE, RIGHT LOWER LOBE 01/09/2008    Qualifier: Diagnosis of  By: Jenny Reichmann MD, Hunt Oris   . 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 Umbilical hernia 24/58/0998    Fat containing on CT 2008  . Lumbar disc disease 10/08/2012    l4-5 per CT 2008  . Empty sella 03/17/2013   No past surgical history on file.  reports that she has never smoked. She has never used smokeless tobacco. She reports that she does not drink alcohol or use illicit drugs. family history includes Hypertension in her father. Allergies  Allergen Reactions  . Ace Inhibitors     REACTION: cough  . Sulfonamide Derivatives    Current Outpatient Prescriptions on  File Prior to Visit  Medication Sig Dispense Refill  . amLODipine (NORVASC) 5 MG tablet Take 1 tablet (5 mg total) by mouth daily.  90 tablet  3  . aspirin 81 MG tablet Take 81 mg by mouth daily.      Marland Kitchen glipiZIDE (GLUCOTROL) 5 MG tablet TAKE 1 TABLET (5 MG TOTAL) BY MOUTH DAILY.  30 tablet  10  . glipiZIDE (GLUCOTROL) 5 MG tablet Take 1 tablet (5 mg total) by mouth daily.  90 tablet  3  . irbesartan (AVAPRO) 150 MG tablet Take 1 tablet (150 mg total) by mouth daily.  90 tablet  2  . meloxicam (MOBIC) 15 MG tablet Take 1 tablet (15 mg total) by mouth daily as needed for pain.  30 tablet  5  . metFORMIN (GLUCOPHAGE) 500 MG tablet TAKE 1 TABLET (500 MG TOTAL) BY MOUTH 2 (TWO) TIMES DAILY WITH A MEAL.  60 tablet  10  . metFORMIN (GLUCOPHAGE) 500 MG tablet Take 1 tablet (500 mg total) by mouth 2 (two) times daily with a meal.  180 tablet  3  . pravastatin (PRAVACHOL) 40 MG tablet TAKE 1 TABLET (40 MG TOTAL) BY MOUTH DAILY.  30 tablet  10  . pravastatin (PRAVACHOL) 40 MG tablet Take 1 tablet (40 mg total) by mouth daily.  [place]  tablet  3  . irbesartan (AVAPRO) 150 MG tablet Take 1 tablet (150 mg total) by mouth at bedtime.  90 tablet  3   No current facility-administered medications on file prior to visit.    Review of Systems  Constitutional: Negative for unexpected weight change, or unusual diaphoresis  HENT: Negative for tinnitus.   Eyes: Negative for photophobia and visual disturbance.  Respiratory: Negative for choking and stridor.   Gastrointestinal: Negative for vomiting and blood in stool.  Genitourinary: Negative for hematuria and decreased urine volume.  Musculoskeletal: Negative for acute joint swelling Skin: Negative for color change and wound.  Neurological: Negative for tremors and numbness other than noted  Psychiatric/Behavioral: Negative for decreased concentration or  hyperactivity.       Objective:   Physical Exam BP 108/78  Pulse 73  Temp(Src) 98.4 F (36.9 C) (Oral)   Ht 5\' 6"  (1.676 m)  Wt 194 lb (87.998 kg)  BMI 31.33 kg/m2  SpO2 98% VS noted,  Constitutional: Pt appears well-developed and well-nourished.  HENT: Head: NCAT.  Right Ear: External ear normal.  Left Ear: External ear normal.  Eyes: Conjunctivae and EOM are normal. Pupils are equal, round, and reactive to light.  Neck: Normal range of motion. Neck supple.  Cardiovascular: Normal rate and regular rhythm.   Pulmonary/Chest: Effort normal and breath sounds normal.  Abd:  Soft, NT, non-distended, + BS Neurological: Pt is alert. Not confused  Skin: Skin is warm. No erythema.  Psychiatric: Pt behavior is normal. Thought content normal.     Assessment & Plan:

## 2014-03-25 ENCOUNTER — Encounter: Payer: Self-pay | Admitting: Internal Medicine

## 2014-03-25 ENCOUNTER — Other Ambulatory Visit (INDEPENDENT_AMBULATORY_CARE_PROVIDER_SITE_OTHER): Payer: Medicare Other

## 2014-03-25 ENCOUNTER — Telehealth: Payer: Self-pay | Admitting: Internal Medicine

## 2014-03-25 DIAGNOSIS — E119 Type 2 diabetes mellitus without complications: Secondary | ICD-10-CM | POA: Diagnosis not present

## 2014-03-25 LAB — BASIC METABOLIC PANEL
BUN: 18 mg/dL (ref 6–23)
CHLORIDE: 104 meq/L (ref 96–112)
CO2: 27 meq/L (ref 19–32)
Calcium: 9.4 mg/dL (ref 8.4–10.5)
Creatinine, Ser: 0.9 mg/dL (ref 0.4–1.2)
GFR: 80.36 mL/min (ref >60.00)
GLUCOSE: 116 mg/dL — AB (ref 70–99)
Potassium: 3.8 mEq/L (ref 3.5–5.1)
SODIUM: 138 meq/L (ref 135–145)

## 2014-03-25 LAB — LIPID PANEL
CHOL/HDL RATIO: 3
Cholesterol: 133 mg/dL (ref 0–200)
HDL: 52.5 mg/dL (ref >39.00)
LDL CALC: 71 mg/dL (ref 0–99)
Triglycerides: 48 mg/dL (ref 0.0–149.0)
VLDL: 9.6 mg/dL (ref 0.0–40.0)

## 2014-03-25 LAB — HEPATIC FUNCTION PANEL
ALK PHOS: 79 U/L (ref 39–117)
ALT: 33 U/L (ref 0–35)
AST: 28 U/L (ref 0–37)
Albumin: 3.9 g/dL (ref 3.5–5.2)
BILIRUBIN DIRECT: 0.1 mg/dL (ref 0.0–0.3)
TOTAL PROTEIN: 7.5 g/dL (ref 6.0–8.3)
Total Bilirubin: 0.8 mg/dL (ref 0.3–1.2)

## 2014-03-25 LAB — HEMOGLOBIN A1C: Hgb A1c MFr Bld: 6.6 % — ABNORMAL HIGH (ref 4.6–6.5)

## 2014-03-25 NOTE — Telephone Encounter (Signed)
Relevant patient education mailed to patient.  

## 2014-03-25 NOTE — Assessment & Plan Note (Signed)
stable overall by history and exam, recent data reviewed with pt, and pt to continue medical treatment as before,  to f/u any worsening symptoms or concerns Lab Results  Component Value Date   LDLCALC 63 09/16/2013

## 2014-03-25 NOTE — Assessment & Plan Note (Signed)
stable overall by history and exam, recent data reviewed with pt, and pt to continue medical treatment as before,  to f/u any worsening symptoms or concerns Lab Results  Component Value Date   HGBA1C 7.0* 09/16/2013

## 2014-03-25 NOTE — Assessment & Plan Note (Signed)
stable overall by history and exam, recent data reviewed with pt, and pt to continue medical treatment as before,  to f/u any worsening symptoms or concerns BP Readings from Last 3 Encounters:  03/24/14 108/78  09/16/13 110/80  04/07/13 118/70

## 2014-04-07 ENCOUNTER — Telehealth: Payer: Self-pay

## 2014-04-07 NOTE — Telephone Encounter (Signed)
Relevant patient education mailed to patient.  

## 2014-05-14 ENCOUNTER — Encounter: Payer: Self-pay | Admitting: Internal Medicine

## 2014-05-14 ENCOUNTER — Ambulatory Visit (INDEPENDENT_AMBULATORY_CARE_PROVIDER_SITE_OTHER): Payer: Medicare Other | Admitting: Internal Medicine

## 2014-05-14 VITALS — BP 120/78 | HR 72 | Temp 98.2°F | Wt 197.2 lb

## 2014-05-14 DIAGNOSIS — E119 Type 2 diabetes mellitus without complications: Secondary | ICD-10-CM | POA: Diagnosis not present

## 2014-05-14 DIAGNOSIS — M5412 Radiculopathy, cervical region: Secondary | ICD-10-CM

## 2014-05-14 DIAGNOSIS — J309 Allergic rhinitis, unspecified: Secondary | ICD-10-CM | POA: Diagnosis not present

## 2014-05-14 MED ORDER — FEXOFENADINE HCL 180 MG PO TABS: 180.0000 mg | ORAL_TABLET | Freq: Every day | ORAL | Status: DC

## 2014-05-14 MED ORDER — METHYLPREDNISOLONE ACETATE 80 MG/ML IJ SUSP
80.0000 mg | Freq: Once | INTRAMUSCULAR | Status: AC
  Administered 2014-05-14: 80 mg via INTRAMUSCULAR

## 2014-05-14 MED ORDER — AMLODIPINE BESYLATE 5 MG PO TABS: 5.0000 mg | ORAL_TABLET | Freq: Every day | ORAL | Status: DC

## 2014-05-14 MED ORDER — PREDNISONE 10 MG PO TABS: ORAL_TABLET | ORAL | Status: DC

## 2014-05-14 MED ORDER — FLUTICASONE PROPIONATE 50 MCG/ACT NA SUSP: 2.0000 | Freq: Every day | NASAL | Status: DC

## 2014-05-14 NOTE — Assessment & Plan Note (Addendum)
Mild to mod,with neuro change, for depomedrol IM, predpac asd, C-spine MRI, NS referral,  to f/u any worsening symptoms or concerns

## 2014-05-14 NOTE — Assessment & Plan Note (Addendum)
Mild to mod, for depomedrol IM, predpac asd,  For flonase, allegra prn, to f/u any worsening symptoms or concerns

## 2014-05-14 NOTE — Patient Instructions (Addendum)
You had the steroid shot today  Please take all new medication as prescribed - the prednisone, as well as the flonase nasal spray and allegra for allergies  Please continue all other medications as before, and refills have been done if requested.  Please have the pharmacy call with any other refills you may need.  You are given the copy of your recent lab work  You will be contacted regarding the referral for: neurosurgury

## 2014-05-14 NOTE — Assessment & Plan Note (Signed)
stable overall by history and exam, recent data reviewed with pt, and pt to continue medical treatment as before,  to f/u any worsening symptoms or concerns Lab Results  Component Value Date   HGBA1C 6.6* 03/25/2014

## 2014-05-14 NOTE — Progress Notes (Signed)
Subjective:    Patient ID: Andrea Anthony, female    DOB: 03-20-1946, 68 y.o.   MRN: [id number]  HPI  Here with several wks ongoing nasal allergy symptoms with ST, clearish congestion, itch and sneezing, without fever, pain, cough, swelling or wheezing.   Pt denies polydipsia, polyuria, or low sugar symptoms such as weakness or confusion improved with po intake.  Pt states overall good compliance with meds, trying to follow lower cholesterol, diabetic diet, wt overall stable but little exercise however.    Also with 4 wks onset worsening persistent RUE pain/weakness/numb with mild loss right grip.   Pt denies fever, wt loss, night sweats, loss of appetite, or other constitutional symptoms Past Medical History  Diagnosis Date  . DIABETES MELLITUS, TYPE II 06/24/2007    Qualifier: Diagnosis of  By: Jenny Reichmann MD, Hunt Oris   . HYPERLIPIDEMIA 06/01/2008    Qualifier: Diagnosis of  By: Jenny Reichmann MD, Shelbie Hutching DEFICIENCY 01/07/2008    Qualifier: Diagnosis of  By: Jenny Reichmann MD, Hunt Oris   . HYPERTENSION 06/24/2007    Qualifier: Diagnosis of  By: Jenny Reichmann MD, Hunt Oris   . ALLERGIC RHINITIS 01/07/2008    Qualifier: Diagnosis of  By: Jenny Reichmann MD, Hunt Oris   . GERD 05/24/2009    Qualifier: Diagnosis of  By: Jenny Reichmann MD, Hunt Oris   . DIVERTICULOSIS, COLON 01/07/2008    Qualifier: Diagnosis of  By: Jenny Reichmann MD, Hunt Oris   . PULMONARY NODULE, RIGHT LOWER LOBE 01/09/2008    Qualifier: Diagnosis of  By: Jenny Reichmann MD, Hunt Oris   . 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 Umbilical hernia 69/79/4801    Fat containing on CT 2008  . Lumbar disc disease 10/08/2012    l4-5 per CT 2008  . Empty sella 03/17/2013   No past surgical history on file.  reports that she has never smoked. She has never used smokeless tobacco. She reports that she does not drink alcohol or use illicit drugs. family history includes Hypertension in her father. Allergies  Allergen Reactions  . Ace Inhibitors     REACTION: cough  . Sulfonamide Derivatives     Current Outpatient Prescriptions on File Prior to Visit  Medication Sig Dispense Refill  . aspirin 81 MG tablet Take 81 mg by mouth daily.      Marland Kitchen glipiZIDE (GLUCOTROL) 5 MG tablet TAKE 1 TABLET (5 MG TOTAL) BY MOUTH DAILY.  30 tablet  10  . glipiZIDE (GLUCOTROL) 5 MG tablet Take 1 tablet (5 mg total) by mouth daily.  90 tablet  3  . irbesartan (AVAPRO) 150 MG tablet Take 1 tablet (150 mg total) by mouth daily.  90 tablet  2  . meloxicam (MOBIC) 15 MG tablet Take 1 tablet (15 mg total) by mouth daily as needed for pain.  30 tablet  5  . metFORMIN (GLUCOPHAGE) 500 MG tablet TAKE 1 TABLET (500 MG TOTAL) BY MOUTH 2 (TWO) TIMES DAILY WITH A MEAL.  60 tablet  10  . metFORMIN (GLUCOPHAGE) 500 MG tablet Take 1 tablet (500 mg total) by mouth 2 (two) times daily with a meal.  180 tablet  3  . pravastatin (PRAVACHOL) 40 MG tablet TAKE 1 TABLET (40 MG TOTAL) BY MOUTH DAILY.  30 tablet  10  . irbesartan (AVAPRO) 150 MG tablet Take 1 tablet (150 mg total) by mouth at bedtime.  90 tablet  3   No current facility-administered medications on file prior to visit.  Review of Systems  Constitutional: Negative for unusual diaphoresis or other sweats  HENT: Negative for ringing in ear Eyes: Negative for double vision or worsening visual disturbance.  Respiratory: Negative for choking and stridor.   Gastrointestinal: Negative for vomiting or other signifcant bowel change Genitourinary: Negative for hematuria or decreased urine volume.  Musculoskeletal: Negative for other MSK pain or swelling Skin: Negative for color change and worsening wound.  Neurological: Negative for tremors and numbness other than noted  Psychiatric/Behavioral: Negative for decreased concentration or agitation other than above       Objective:   Physical Exam BP 120/78  Pulse 72  Temp(Src) 98.2 F (36.8 C) (Oral)  Wt 197 lb 4 oz (89.472 kg)  SpO2 97% VS noted, non toxic Constitutional: Pt appears well-developed,  well-nourished.  HENT: Head: NCAT.  Right Ear: External ear normal.  Left Ear: External ear normal.  Bilat tm's with mild erythema.  Max sinus areas non tender.  Pharynx with mild erythema, no exudate Eyes: . Pupils are equal, round, and reactive to light. Conjunctivae and EOM are normal Neck: Normal range of motion. Neck supple.  Cardiovascular: Normal rate and regular rhythm.   Pulmonary/Chest: Effort normal and breath sounds normal.  Abd:  Soft, NT, ND, + BS Neurological: Pt is alert. Not confused , motor grossly intact except 4+/5 RUE motor with decr sens to LT and slight decr right grip strength, dtr intact Skin: Skin is warm. No rash Psychiatric: Pt behavior is normal. No agitation.     Assessment & Plan:

## 2014-05-14 NOTE — Progress Notes (Signed)
Pre visit review using our clinic review tool, if applicable. No additional management support is needed unless otherwise documented below in the visit note. 

## 2014-06-01 ENCOUNTER — Ambulatory Visit
Admission: RE | Admit: 2014-06-01 | Discharge: 2014-06-01 | Disposition: A | Discharge status: Home / Self Care | Payer: Medicare Other | Source: Ambulatory Visit | Attending: Internal Medicine | Admitting: Internal Medicine

## 2014-06-01 ENCOUNTER — Encounter: Payer: Self-pay | Admitting: Internal Medicine

## 2014-06-01 DIAGNOSIS — M5412 Radiculopathy, cervical region: Secondary | ICD-10-CM

## 2014-06-01 DIAGNOSIS — M4310 Spondylolisthesis, site unspecified: Secondary | ICD-10-CM | POA: Diagnosis not present

## 2014-06-01 DIAGNOSIS — M47812 Spondylosis without myelopathy or radiculopathy, cervical region: Secondary | ICD-10-CM | POA: Diagnosis not present

## 2014-07-06 DIAGNOSIS — Z6831 Body mass index (BMI) 31.0-31.9, adult: Secondary | ICD-10-CM | POA: Diagnosis not present

## 2014-07-06 DIAGNOSIS — M542 Cervicalgia: Secondary | ICD-10-CM | POA: Diagnosis not present

## 2014-07-13 ENCOUNTER — Other Ambulatory Visit: Payer: Self-pay | Admitting: Internal Medicine

## 2014-09-29 ENCOUNTER — Ambulatory Visit: Payer: Medicare Other | Admitting: Internal Medicine

## 2014-09-29 DIAGNOSIS — Z0289 Encounter for other administrative examinations: Secondary | ICD-10-CM

## 2014-10-14 ENCOUNTER — Emergency Department (INDEPENDENT_AMBULATORY_CARE_PROVIDER_SITE_OTHER)
Admission: EM | Admit: 2014-10-14 | Discharge: 2014-10-14 | Disposition: A | Discharge status: Home / Self Care | Payer: Medicare Other | Source: Home / Self Care | Attending: Emergency Medicine | Admitting: Emergency Medicine

## 2014-10-14 ENCOUNTER — Encounter: Payer: Self-pay | Admitting: *Deleted

## 2014-10-14 DIAGNOSIS — L03116 Cellulitis of left lower limb: Secondary | ICD-10-CM | POA: Diagnosis not present

## 2014-10-14 MED ORDER — DOXYCYCLINE HYCLATE 100 MG PO CAPS: 100.0000 mg | ORAL_CAPSULE | Freq: Two times a day (BID) | ORAL | Status: DC

## 2014-10-14 NOTE — Discharge Instructions (Signed)
Cellulitis Cellulitis is an infection of the skin and the tissue under the skin. The infected area is usually red and tender. This happens most often in the arms and lower legs. HOME CARE   Take your antibiotic medicine as told.--we sent your prescription to your pharmacy. Finish the medicine even if you start to feel better.  May take Tylenol or ibuprofen if needed for pain  Keep the infected arm or leg raised (elevated).  Put a warm cloth on the area up to 4 times per day.  Only take medicines as told by your doctor.  Keep all doctor visits as told. GET HELP IF:  You see red streaks on the skin coming from the infected area.  Your red area gets bigger or turns a dark color.  Your bone or joint under the infected area is painful after the skin heals.  Your infection comes back in the same area or different area.  You have a puffy (swollen) bump in the infected area.  You have new symptoms.  You have a fever. GET HELP RIGHT AWAY IF:   You feel very sleepy.  You throw up (vomit) or have watery poop (diarrhea).  You feel sick and have muscle aches and pains. MAKE SURE YOU:   Understand these instructions.  Will watch your condition.  Will get help right away if you are not doing well or get worse. Document Released: 05/15/2008 Document Revised: 04/13/2014 Document Reviewed: 02/12/2012 [facility] Patient Information 2015 [place], [place]. This information is not intended to replace advice given to you by your health care provider. Make sure you discuss any questions you have with your health care provider.

## 2014-10-14 NOTE — ED Provider Notes (Signed)
CSN: [id number]     Arrival date & time 10/14/14  1339 History   First MD Initiated Contact with Patient 10/14/14 1415     Chief Complaint  Patient presents with  . Dizziness  . Insect Bite   (Consider location/radiation/quality/duration/timing/severity/associated sxs/prior Treatment) HPI Pt c/o dizziness, nausea and bug bite to her LT upper leg x 5 days. Denies fever.  Daughter brings her in helps with giving history. They did not see any particular bug but they suspect that she had some type of insect bite left medial upper thigh 5 days ago. Has felt fatigued since but otherwise without specific symptoms until today felt a little lightheaded at work, minimal nausea but no vomiting. No vertigo or focal neurologic symptoms. Denies fever or chills. No chest pain or shortness of breath. No abdominal pain. Denies itch. Currently denies any nausea. Her lightheadedness has improved. Tolerating by mouth's today well. Past Medical History  Diagnosis Date  . DIABETES MELLITUS, TYPE II 06/24/2007    Qualifier: Diagnosis of  By: Jenny Reichmann MD, Hunt Oris   . HYPERLIPIDEMIA 06/01/2008    Qualifier: Diagnosis of  By: Jenny Reichmann MD, Shelbie Hutching DEFICIENCY 01/07/2008    Qualifier: Diagnosis of  By: Jenny Reichmann MD, Hunt Oris   . HYPERTENSION 06/24/2007    Qualifier: Diagnosis of  By: Jenny Reichmann MD, Hunt Oris   . ALLERGIC RHINITIS 01/07/2008    Qualifier: Diagnosis of  By: Jenny Reichmann MD, Hunt Oris   . GERD 05/24/2009    Qualifier: Diagnosis of  By: Jenny Reichmann MD, Hunt Oris   . DIVERTICULOSIS, COLON 01/07/2008    Qualifier: Diagnosis of  By: Jenny Reichmann MD, Hunt Oris   . PULMONARY NODULE, RIGHT LOWER LOBE 01/09/2008    Qualifier: Diagnosis of  By: Jenny Reichmann MD, Hunt Oris   . 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 Umbilical hernia 40/98/1191    Fat containing on CT 2008  . Lumbar disc disease 10/08/2012    l4-5 per CT 2008  . Empty sella 03/17/2013   History reviewed. No pertinent past surgical history. Family History  Problem Relation Age of  Onset  . Hypertension Father    History  Substance Use Topics  . Smoking status: Never Smoker   . Smokeless tobacco: Never Used  . Alcohol Use: No   OB History    No data available     Review of Systems  All other systems reviewed and are negative.   Allergies  Ace inhibitors and Sulfonamide derivatives  Home Medications   Prior to Admission medications   Medication Sig Start Date End Date Taking? Authorizing Provider  amLODipine (NORVASC) 5 MG tablet Take 1 tablet (5 mg total) by mouth daily. 05/14/14 05/14/15  Biagio Borg, MD  aspirin 81 MG tablet Take 81 mg by mouth daily.    Historical Provider, MD  doxycycline (VIBRAMYCIN) 100 MG capsule Take 1 capsule (100 mg total) by mouth 2 (two) times daily. 10/14/14   Jacqulyn Cane, MD  fexofenadine (ALLEGRA) 180 MG tablet Take 1 tablet (180 mg total) by mouth daily. 05/14/14   Biagio Borg, MD  fluticasone (FLONASE) 50 MCG/ACT nasal spray Place 2 sprays into both nostrils daily. 05/14/14   Biagio Borg, MD  glipiZIDE (GLUCOTROL) 5 MG tablet TAKE 1 TABLET (5 MG TOTAL) BY MOUTH DAILY. 02/09/14   Biagio Borg, MD  glipiZIDE (GLUCOTROL) 5 MG tablet Take 1 tablet (5 mg total) by mouth daily. 03/17/14   Biagio Borg,  MD  irbesartan (AVAPRO) 150 MG tablet Take 1 tablet (150 mg total) by mouth at bedtime. 03/10/13 03/10/14  Biagio Borg, MD  irbesartan (AVAPRO) 150 MG tablet Take 1 tablet (150 mg total) by mouth daily. 03/17/14   Biagio Borg, MD  irbesartan (AVAPRO) 150 MG tablet TAKE 1 TABLET (150 MG TOTAL) BY MOUTH AT BEDTIME.    Biagio Borg, MD  meloxicam (MOBIC) 15 MG tablet Take 1 tablet (15 mg total) by mouth daily as needed for pain. 09/16/13   Biagio Borg, MD  metFORMIN (GLUCOPHAGE) 500 MG tablet TAKE 1 TABLET (500 MG TOTAL) BY MOUTH 2 (TWO) TIMES DAILY WITH A MEAL. 02/09/14   Biagio Borg, MD  metFORMIN (GLUCOPHAGE) 500 MG tablet Take 1 tablet (500 mg total) by mouth 2 (two) times daily with a meal. 03/17/14   Biagio Borg, MD  pravastatin (PRAVACHOL)  40 MG tablet TAKE 1 TABLET (40 MG TOTAL) BY MOUTH DAILY. 02/09/14   Biagio Borg, MD  predniSONE (DELTASONE) 10 MG tablet 3 tabs by mouth per day for 3 days,2tabs per day for 3 days,1tab per day for 3 days 05/14/14   Biagio Borg, MD   BP 128/81 mmHg  Pulse 74  Temp(Src) 98.7 F (37.1 C) (Oral)  Resp 18  Ht 5\' 6"  (1.676 m)  Wt 196 lb (88.905 kg)  BMI 31.65 kg/m2  SpO2 98% Physical Exam  Constitutional: She is oriented to person, place, and time. She appears well-developed and well-nourished. No distress.  HENT:  Head: Normocephalic and atraumatic.  Mouth/Throat: Oropharynx is clear and moist.  Eyes: Conjunctivae and EOM are normal. Pupils are equal, round, and reactive to light. No scleral icterus.  Neck: Normal range of motion. Neck supple. No JVD present. No tracheal deviation present.  Cardiovascular: Normal rate, regular rhythm, normal heart sounds and intact distal pulses.   No murmur heard. Pulmonary/Chest: Effort normal and breath sounds normal. No stridor. No respiratory distress.  Abdominal: Soft. Bowel sounds are normal. She exhibits no distension. There is no tenderness. There is no rebound and no guarding.  Reducible nontender umbilical hernia  Musculoskeletal: Normal range of motion. She exhibits no edema.  No cyanosis, clubbing, or pretibial edema  Lymphadenopathy:    She has no cervical adenopathy.  Neurological: She is alert and oriented to person, place, and time. No cranial nerve deficit.  Skin: Skin is warm. Rash noted. She is not diaphoretic.     Psychiatric: She has a normal mood and affect.  Nursing note and vitals reviewed.  Patient is alert, cooperative. Can ambulate normally. No orthostatic symptoms here in the office. ED Course  Procedures (including critical care time) Labs Review Labs Reviewed - No data to display Patient and daughter declined any labs today such as CBC and blood glucose. Imaging Review No results found.   MDM   1. Cellulitis of  left thigh    Treatment options discussed, as well as risks, benefits, alternatives. Patient and daughter voiced understanding and agreement with the following plans: New Prescriptions   DOXYCYCLINE (VIBRAMYCIN) 100 MG CAPSULE    Take 1 capsule (100 mg total) by mouth 2 (two) times daily.   See detailed Instructions in AVS, which were given to patient and daughter. Verbal instructions also given. Risks, benefits, and alternatives of treatment options discussed. Questions invited and answered. Patient and daughter voiced understanding and agreement with plans. Follow-up with your primary care doctor in 7 days , or sooner if symptoms become  worse. Precautions discussed. Red flags discussed.--emergency room if any red flag Questions invited and answered. Patient and daughtervoiced understanding and agreement.      Jacqulyn Cane, MD 10/14/14 [phone]

## 2014-10-14 NOTE — ED Notes (Signed)
Pt c/o dizziness, nausea and bug bite to her LT upper leg x 5 days. Denies fever.

## 2014-10-15 ENCOUNTER — Ambulatory Visit: Payer: Medicare Other | Admitting: Internal Medicine

## 2014-10-15 DIAGNOSIS — Z0289 Encounter for other administrative examinations: Secondary | ICD-10-CM

## 2015-01-14 ENCOUNTER — Ambulatory Visit: Payer: Medicare Other | Admitting: Internal Medicine

## 2015-01-19 ENCOUNTER — Other Ambulatory Visit (INDEPENDENT_AMBULATORY_CARE_PROVIDER_SITE_OTHER): Payer: Medicare Other

## 2015-01-19 ENCOUNTER — Encounter: Payer: Self-pay | Admitting: Internal Medicine

## 2015-01-19 ENCOUNTER — Ambulatory Visit (INDEPENDENT_AMBULATORY_CARE_PROVIDER_SITE_OTHER): Payer: Medicare Other | Admitting: Internal Medicine

## 2015-01-19 VITALS — BP 124/86 | HR 80 | Temp 98.9°F | Ht 66.0 in | Wt 199.0 lb

## 2015-01-19 DIAGNOSIS — M25512 Pain in left shoulder: Secondary | ICD-10-CM

## 2015-01-19 DIAGNOSIS — Z0000 Encounter for general adult medical examination without abnormal findings: Secondary | ICD-10-CM

## 2015-01-19 DIAGNOSIS — E119 Type 2 diabetes mellitus without complications: Secondary | ICD-10-CM

## 2015-01-19 DIAGNOSIS — E785 Hyperlipidemia, unspecified: Secondary | ICD-10-CM | POA: Diagnosis not present

## 2015-01-19 DIAGNOSIS — Z0189 Encounter for other specified special examinations: Secondary | ICD-10-CM

## 2015-01-19 DIAGNOSIS — I10 Essential (primary) hypertension: Secondary | ICD-10-CM | POA: Diagnosis not present

## 2015-01-19 LAB — BASIC METABOLIC PANEL
BUN: 23 mg/dL (ref 6–23)
CO2: 29 mEq/L (ref 19–32)
Calcium: 9.7 mg/dL (ref 8.4–10.5)
Chloride: 105 mEq/L (ref 96–112)
Creatinine, Ser: 1.33 mg/dL — ABNORMAL HIGH (ref 0.40–1.20)
GFR: 51.08 mL/min — AB (ref >60.00)
Glucose, Bld: 166 mg/dL — ABNORMAL HIGH (ref 70–99)
POTASSIUM: 4.4 meq/L (ref 3.5–5.1)
Sodium: 139 mEq/L (ref 135–145)

## 2015-01-19 LAB — HEMOGLOBIN A1C: Hgb A1c MFr Bld: 8.8 % — ABNORMAL HIGH (ref 4.6–6.5)

## 2015-01-19 LAB — LIPID PANEL
CHOL/HDL RATIO: 3
Cholesterol: 150 mg/dL (ref 0–200)
HDL: 48.7 mg/dL (ref >39.00)
LDL Cholesterol: 77 mg/dL (ref 0–99)
NonHDL: 101.3
Triglycerides: 124 mg/dL (ref 0.0–149.0)
VLDL: 24.8 mg/dL (ref 0.0–40.0)

## 2015-01-19 LAB — HEPATIC FUNCTION PANEL
ALT: 35 U/L (ref 0–35)
AST: 23 U/L (ref 0–37)
Albumin: 4.1 g/dL (ref 3.5–5.2)
Alkaline Phosphatase: 104 U/L (ref 39–117)
BILIRUBIN TOTAL: 0.4 mg/dL (ref 0.2–1.2)
Bilirubin, Direct: 0 mg/dL (ref 0.0–0.3)
Total Protein: 7.3 g/dL (ref 6.0–8.3)

## 2015-01-19 NOTE — Assessment & Plan Note (Signed)
stable overall by history and exam, recent data reviewed with pt, and pt to continue medical treatment as before,  to f/u any worsening symptoms or concerns Lab Results  Component Value Date   LDLCALC 71 03/25/2014   Cont DM /low chol diet, current meds

## 2015-01-19 NOTE — Progress Notes (Signed)
Pre visit review using our clinic review tool, if applicable. No additional management support is needed unless otherwise documented below in the visit note. 

## 2015-01-19 NOTE — Progress Notes (Addendum)
Subjective:    Patient ID: Andrea Anthony, female    DOB: Nov 07, 1946, 69 y.o.   MRN: [id number]  HPI  Here to f/u; overall doing ok,  Pt denies chest pain, increased sob or doe, wheezing, orthopnea, PND, increased LE swelling, palpitations, dizziness or syncope.  Pt denies polydipsia, polyuria, or low sugar symptoms such as weakness or confusion improved with po intake.  Pt denies new neurological symptoms such as new headache, or facial or extremity weakness or numbness.   Pt states overall good compliance with meds, has been trying to follow lower cholesterol, diabetic diet, with wt overall stable,  but little exercise however. No new  Except for recent pain to bilat shoudlers, left > right, does quite a bit of push/ull/lift at work, Has not yet been able to schedule colonscopy, willing to do so now Past Medical History  Diagnosis Date  . DIABETES MELLITUS, TYPE II 06/24/2007    Qualifier: Diagnosis of  By: Jenny Reichmann MD, Hunt Oris   . HYPERLIPIDEMIA 06/01/2008    Qualifier: Diagnosis of  By: Jenny Reichmann MD, Shelbie Hutching DEFICIENCY 01/07/2008    Qualifier: Diagnosis of  By: Jenny Reichmann MD, Hunt Oris   . HYPERTENSION 06/24/2007    Qualifier: Diagnosis of  By: Jenny Reichmann MD, Hunt Oris   . ALLERGIC RHINITIS 01/07/2008    Qualifier: Diagnosis of  By: Jenny Reichmann MD, Hunt Oris   . GERD 05/24/2009    Qualifier: Diagnosis of  By: Jenny Reichmann MD, Hunt Oris   . DIVERTICULOSIS, COLON 01/07/2008    Qualifier: Diagnosis of  By: Jenny Reichmann MD, Hunt Oris   . PULMONARY NODULE, RIGHT LOWER LOBE 01/09/2008    Qualifier: Diagnosis of  By: Jenny Reichmann MD, Hunt Oris   . 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 Umbilical hernia 47/08/6282    Fat containing on CT 2008  . Lumbar disc disease 10/08/2012    l4-5 per CT 2008  . Empty sella 03/17/2013   No past surgical history on file.  reports that she has never smoked. She has never used smokeless tobacco. She reports that she does not drink alcohol or use illicit drugs. family history includes Hypertension in  her father. Allergies  Allergen Reactions  . Ace Inhibitors     REACTION: cough  . Sulfonamide Derivatives    Current Outpatient Prescriptions on File Prior to Visit  Medication Sig Dispense Refill  . amLODipine (NORVASC) 5 MG tablet Take 1 tablet (5 mg total) by mouth daily. 90 tablet 3  . aspirin 81 MG tablet Take 81 mg by mouth daily.    . fexofenadine (ALLEGRA) 180 MG tablet Take 1 tablet (180 mg total) by mouth daily. 90 tablet 3  . fluticasone (FLONASE) 50 MCG/ACT nasal spray Place 2 sprays into both nostrils daily. 16 g 5  . glipiZIDE (GLUCOTROL) 5 MG tablet TAKE 1 TABLET (5 MG TOTAL) BY MOUTH DAILY. 30 tablet 10  . irbesartan (AVAPRO) 150 MG tablet TAKE 1 TABLET (150 MG TOTAL) BY MOUTH AT BEDTIME. 60 tablet 1  . meloxicam (MOBIC) 15 MG tablet Take 1 tablet (15 mg total) by mouth daily as needed for pain. 30 tablet 5  . metFORMIN (GLUCOPHAGE) 500 MG tablet Take 1 tablet (500 mg total) by mouth 2 (two) times daily with a meal. 180 tablet 3  . pravastatin (PRAVACHOL) 40 MG tablet TAKE 1 TABLET (40 MG TOTAL) BY MOUTH DAILY. 30 tablet 10   No current facility-administered medications on file prior to visit.  Review of Systems   Constitutional: Negative for unusual diaphoresis or other sweats  HENT: Negative for ringing in ear Eyes: Negative for double vision or worsening visual disturbance.  Respiratory: Negative for choking and stridor.   Gastrointestinal: Negative for vomiting or other signifcant bowel change Genitourinary: Negative for hematuria or decreased urine volume.  Musculoskeletal: Negative for other MSK pain or swelling Skin: Negative for color change and worsening wound.  Neurological: Negative for tremors and numbness other than noted  Psychiatric/Behavioral: Negative for decreased concentration or agitation other than above       Objective:   Physical Exam BP 124/86 mmHg  Pulse 80  Temp(Src) 98.9 F (37.2 C) (Oral)  Ht 5\' 6"  (1.676 m)  Wt 199 lb (90.266  kg)  BMI 32.13 kg/m2 VS noted,  Constitutional: Pt appears well-developed, well-nourished.  HENT: Head: NCAT.  Right Ear: External ear normal.  Left Ear: External ear normal.  Eyes: . Pupils are equal, round, and reactive to light. Conjunctivae and EOM are normal Neck: Normal range of motion. Neck supple.  Cardiovascular: Normal rate and regular rhythm.   Pulmonary/Chest: Effort normal and breath sounds without rales or wheezing.  Abd:  Soft, NT, ND, + BS Neurological: Pt is alert. Not confused , motor grossly intact Bilat shoulders with pain but FROM to forward elevation, abduction except left with decreased ROM to 100 degreress only to abuction, mild tender to palpate  Skin: Skin is warm. No rash Psychiatric: Pt behavior is normal. No agitation.     Assessment & Plan:

## 2015-01-19 NOTE — Patient Instructions (Signed)
Please continue all other medications as before, and refills have been done if requested.  Please have the pharmacy call with any other refills you may need.  Please continue your efforts at being more active, low cholesterol diet, and weight control.  You are otherwise up to date with prevention measures today.  Please keep your appointments with your specialists as you may have planned  You will be contacted regarding the referral for: colonoscopy  Please go to the LAB in the Basement (turn left off the elevator) for the tests to be done today  You will be contacted by phone if any changes need to be made immediately.  Otherwise, you will receive a letter about your results with an explanation, but please check with MyChart first.  Please remember to sign up for MyChart if you have not done so, as this will be important to you in the future with finding out test results, communicating by private email, and scheduling acute appointments online when needed.  Please return in 6 months, or sooner if needed 

## 2015-01-19 NOTE — Assessment & Plan Note (Signed)
stable overall by history and exam, recent data reviewed with pt, and pt to continue medical treatment as before,  to f/u any worsening symptoms or concerns Lab Results  Component Value Date   HGBA1C 6.6* 03/25/2014   For f/u labs, cont diet, wt loss efforts

## 2015-01-19 NOTE — Assessment & Plan Note (Signed)
stable overall by history and exam, recent data reviewed with pt, and pt to continue medical treatment as before,  to f/u any worsening symptoms or concerns BP Readings from Last 3 Encounters:  01/19/15 124/86  10/14/14 128/81  05/14/14 120/78

## 2015-01-20 ENCOUNTER — Encounter: Payer: Self-pay | Admitting: Internal Medicine

## 2015-01-20 ENCOUNTER — Other Ambulatory Visit: Payer: Self-pay | Admitting: Internal Medicine

## 2015-01-20 MED ORDER — METFORMIN HCL 500 MG PO TABS: 1000.0000 mg | ORAL_TABLET | Freq: Two times a day (BID) | ORAL | Status: DC

## 2015-01-26 ENCOUNTER — Telehealth: Payer: Self-pay | Admitting: Internal Medicine

## 2015-01-26 NOTE — Telephone Encounter (Signed)
Pt request doctor note since was here on 01/19/15, pt request for the note to say that she can not lift heavy duty work due to medical condition. Please call pt's daughter Adair Laundry)

## 2015-01-27 ENCOUNTER — Encounter: Payer: Self-pay | Admitting: Internal Medicine

## 2015-01-27 NOTE — Telephone Encounter (Signed)
Spoke with daughter and she requested that the letter be mailed.  Letter sent to home address.

## 2015-01-27 NOTE — Telephone Encounter (Signed)
Note Done hardcopy to rachel

## 2015-01-27 NOTE — Addendum Note (Signed)
Addended by: Biagio Borg on: 01/27/2015 05:00 AM   Modules accepted: SmartSet

## 2015-01-27 NOTE — Assessment & Plan Note (Signed)
C/w strain vs DJD vs rot cuff, overall midl to mod, declines further pain med, or PT eval or sport med referral for now, will cont to try to see if gets better

## 2015-01-27 NOTE — Telephone Encounter (Signed)
Ok for letter - done 

## 2015-01-27 NOTE — Addendum Note (Signed)
Addended by: Biagio Borg on: 01/27/2015 04:41 AM   Modules accepted: Miquel Dunn

## 2015-02-04 ENCOUNTER — Ambulatory Visit (INDEPENDENT_AMBULATORY_CARE_PROVIDER_SITE_OTHER): Payer: Medicare Other | Admitting: Internal Medicine

## 2015-02-04 ENCOUNTER — Encounter: Payer: Self-pay | Admitting: Internal Medicine

## 2015-02-04 VITALS — BP 124/80 | HR 77 | Temp 97.9°F | Resp 16 | Ht 66.0 in | Wt 196.0 lb

## 2015-02-04 DIAGNOSIS — I10 Essential (primary) hypertension: Secondary | ICD-10-CM

## 2015-02-04 DIAGNOSIS — M5412 Radiculopathy, cervical region: Secondary | ICD-10-CM

## 2015-02-04 NOTE — Progress Notes (Signed)
Pre visit review using our clinic review tool, if applicable. No additional management support is needed unless otherwise documented below in the visit note. 

## 2015-02-04 NOTE — Patient Instructions (Signed)
Torticollis, Acute You have suddenly (acutely) developed a twisted neck (torticollis). This is usually a self-limited condition. CAUSES  Acute torticollis may be caused by malposition, trauma or infection. Most commonly, acute torticollis is caused by sleeping in an awkward position. Torticollis may also be caused by the flexion, extension or twisting of the neck muscles beyond their normal position. Sometimes, the exact cause may not be known. SYMPTOMS  Usually, there is pain and limited movement of the neck. Your neck may twist to one side. DIAGNOSIS  The diagnosis is often made by physical examination. X-rays, CT scans or MRIs may be done if there is a history of trauma or concern of infection. TREATMENT  For a common, stiff neck that develops during sleep, treatment is focused on relaxing the contracted neck muscle. Medications (including shots) may be used to treat the problem. Most cases resolve in several days. Torticollis usually responds to conservative physical therapy. If left untreated, the shortened and spastic neck muscle can cause deformities in the face and neck. Rarely, surgery is required. HOME CARE INSTRUCTIONS   Use over-the-counter and prescription medications as directed by your caregiver.  Do stretching exercises and massage the neck as directed by your caregiver.  Follow up with physical therapy if needed and as directed by your caregiver. SEEK IMMEDIATE MEDICAL CARE IF:   You develop difficulty breathing or noisy breathing (stridor).  You drool, develop trouble swallowing or have pain with swallowing.  You develop numbness or weakness in the hands or feet.  You have changes in speech or vision.  You have problems with urination or bowel movements.  You have difficulty walking.  You have a fever.  You have increased pain. MAKE SURE YOU:   Understand these instructions.  Will watch your condition.  Will get help right away if you are not doing well or  get worse. Document Released: 11/24/2000 Document Revised: 02/19/2012 Document Reviewed: 01/05/2010 [facility] Patient Information 2015 [place], [place]. This information is not intended to replace advice given to you by your health care provider. Make sure you discuss any questions you have with your health care provider. Place neck pain patient instructions here.

## 2015-02-07 ENCOUNTER — Encounter: Payer: Self-pay | Admitting: Internal Medicine

## 2015-02-07 NOTE — Assessment & Plan Note (Signed)
Her BP is well controlled 

## 2015-02-07 NOTE — Progress Notes (Signed)
   Subjective:    Patient ID: Andrea Anthony, female    DOB: 1946-03-09, 69 y.o.   MRN: [id number]  HPI Comments: New to me Andrea Anthony complains of chronic neck pain and asks that I write a note that restricts her from lifting while Andrea Anthony is working. The neck pain is sharp and achy without radiation.     Review of Systems  Constitutional: Negative.   HENT: Negative.   Eyes: Negative.   Respiratory: Negative.   Cardiovascular: Negative.  Negative for chest pain, palpitations and leg swelling.  Gastrointestinal: Negative.  Negative for abdominal pain.  Endocrine: Negative.   Genitourinary: Negative.   Musculoskeletal: Positive for neck pain. Negative for myalgias, back pain, joint swelling, arthralgias, gait problem and neck stiffness.  Skin: Negative.   Allergic/Immunologic: Negative.   Neurological: Negative.  Negative for dizziness, tremors, syncope, weakness, numbness and headaches.  Hematological: Negative.  Negative for adenopathy. Does not bruise/bleed easily.  Psychiatric/Behavioral: Negative.        Objective:   Physical Exam  Constitutional: Andrea Anthony is oriented to person, place, and time. Andrea Anthony appears well-developed and well-nourished. No distress.  HENT:  Head: Normocephalic and atraumatic.  Mouth/Throat: Oropharynx is clear and moist. No oropharyngeal exudate.  Eyes: Conjunctivae are normal. Right eye exhibits no discharge. Left eye exhibits no discharge. No scleral icterus.  Neck: Normal range of motion. Neck supple. No JVD present. No tracheal deviation present. No thyromegaly present.  Cardiovascular: Normal rate, regular rhythm, normal heart sounds and intact distal pulses.  Exam reveals no gallop and no friction rub.   No murmur heard. Pulmonary/Chest: Effort normal and breath sounds normal. No stridor. No respiratory distress. Andrea Anthony has no wheezes. Andrea Anthony has no rales. Andrea Anthony exhibits no tenderness.  Abdominal: Soft. Bowel sounds are normal. Andrea Anthony exhibits no distension and no mass.  There is no tenderness. There is no rebound and no guarding.  Musculoskeletal: Normal range of motion. Andrea Anthony exhibits no edema or tenderness.       Cervical back: Normal. Andrea Anthony exhibits normal range of motion, no tenderness, no bony tenderness, no swelling, no edema, no deformity, no laceration, no pain, no spasm and normal pulse.  Lymphadenopathy:    Andrea Anthony has no cervical adenopathy.  Neurological: Andrea Anthony is alert and oriented to person, place, and time. Andrea Anthony has normal reflexes. Andrea Anthony displays normal reflexes. No cranial nerve deficit. Andrea Anthony exhibits normal muscle tone. Coordination normal.  Skin: Skin is warm and dry. No rash noted. Andrea Anthony is not diaphoretic. No erythema. No pallor.  Vitals reviewed.         Assessment & Plan:

## 2015-02-07 NOTE — Assessment & Plan Note (Signed)
Note written for her work limitations She will cont the current meds

## 2015-02-24 ENCOUNTER — Telehealth: Payer: Self-pay

## 2015-02-24 NOTE — Telephone Encounter (Signed)
Advised pts daughter to call back to schedule appt for flu shot if pt would like to get flu vaccine, daughter is not sure if pt recd flu vaccine for this season

## 2015-04-14 ENCOUNTER — Ambulatory Visit (INDEPENDENT_AMBULATORY_CARE_PROVIDER_SITE_OTHER): Payer: Medicare Other | Admitting: Internal Medicine

## 2015-04-14 ENCOUNTER — Encounter: Payer: Self-pay | Admitting: Internal Medicine

## 2015-04-14 VITALS — BP 118/72 | HR 61 | Temp 98.1°F | Resp 18 | Ht 66.0 in | Wt 187.1 lb

## 2015-04-14 DIAGNOSIS — E119 Type 2 diabetes mellitus without complications: Secondary | ICD-10-CM

## 2015-04-14 DIAGNOSIS — E785 Hyperlipidemia, unspecified: Secondary | ICD-10-CM | POA: Diagnosis not present

## 2015-04-14 DIAGNOSIS — I10 Essential (primary) hypertension: Secondary | ICD-10-CM | POA: Diagnosis not present

## 2015-04-14 MED ORDER — GLIPIZIDE ER 10 MG PO TB24: 10.0000 mg | ORAL_TABLET | Freq: Every day | ORAL | Status: DC

## 2015-04-14 NOTE — Assessment & Plan Note (Signed)
stable overall by history and exam, recent data reviewed with pt, and pt to continue medical treatment as before,  to f/u any worsening symptoms or concerns BP Readings from Last 3 Encounters:  04/14/15 118/72  02/04/15 124/80  01/19/15 124/86

## 2015-04-14 NOTE — Assessment & Plan Note (Signed)
stable overall by history and exam, recent data reviewed with pt, and pt to continue medical treatment as before,  to f/u any worsening symptoms or concerns Lab Results  Component Value Date   LDLCALC 77 01/19/2015

## 2015-04-14 NOTE — Progress Notes (Signed)
Pre visit review using our clinic review tool, if applicable. No additional management support is needed unless otherwise documented below in the visit note. 

## 2015-04-14 NOTE — Assessment & Plan Note (Signed)
Uncontrolled on metformin alone, ok to re-start but higher dose glipizide, cont diet, monitor cbg's, f/u 3 mo

## 2015-04-14 NOTE — Patient Instructions (Signed)
Please take all new medication as prescribed - the glipizide ER 10 mg per day  Please continue all other medications as before, and refills have been done if requested.  Please have the pharmacy call with any other refills you may need.  Please continue your efforts at being more active, diabeteic low cholesterol diet, and weight control..  Please keep your appointments with your specialists as you may have planned  Please return in 3 months, or sooner if needed

## 2015-04-14 NOTE — Progress Notes (Signed)
Subjective:    Patient ID: Andrea Anthony, female    DOB: 13-Jan-1946, 69 y.o.   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  Pt denies new neurological symptoms such as new headache, or facial or extremity weakness or numbness.  Pt denies polydipsia, polyuria, or low sugar episode.   Pt denies new neurological symptoms such as new headache, or facial or extremity weakness or numbness.   Pt states overall good compliance with meds, mostly trying to follow appropriate diet, with wt overall down 12 lbs from last visit.,  but little exercise however.CBG's in 190-250 despite current meds, sometimes even higher in the 300's..ON med review today we realize for some reason she is no longer taking the glipizide. Wt Readings from Last 3 Encounters:  04/14/15 187 lb 1.3 oz (84.859 kg)  02/04/15 196 lb (88.905 kg)  01/19/15 199 lb (90.266 kg)  Job ended April 30, so likely being less active soon. Past Medical History  Diagnosis Date  . DIABETES MELLITUS, TYPE II 06/24/2007    Qualifier: Diagnosis of  By: Jenny Reichmann MD, Hunt Oris   . HYPERLIPIDEMIA 06/01/2008    Qualifier: Diagnosis of  By: Jenny Reichmann MD, Shelbie Hutching DEFICIENCY 01/07/2008    Qualifier: Diagnosis of  By: Jenny Reichmann MD, Hunt Oris   . HYPERTENSION 06/24/2007    Qualifier: Diagnosis of  By: Jenny Reichmann MD, Hunt Oris   . ALLERGIC RHINITIS 01/07/2008    Qualifier: Diagnosis of  By: Jenny Reichmann MD, Hunt Oris   . GERD 05/24/2009    Qualifier: Diagnosis of  By: Jenny Reichmann MD, Hunt Oris   . DIVERTICULOSIS, COLON 01/07/2008    Qualifier: Diagnosis of  By: Jenny Reichmann MD, Hunt Oris   . PULMONARY NODULE, RIGHT LOWER LOBE 01/09/2008    Qualifier: Diagnosis of  By: Jenny Reichmann MD, Hunt Oris   . 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 Umbilical hernia 52/84/1324    Fat containing on CT 2008  . Lumbar disc disease  10/08/2012    l4-5 per CT 2008  . Empty sella 03/17/2013   No past surgical history on file.  reports that she has never smoked. She has never used smokeless tobacco. She reports that she does not drink alcohol or use illicit drugs. family history includes Hypertension in her father. Allergies  Allergen Reactions  . Ace Inhibitors     REACTION: cough  . Sulfonamide Derivatives    Current Outpatient Prescriptions on File Prior to Visit  Medication Sig Dispense Refill  . amLODipine (NORVASC) 5 MG tablet Take 1 tablet (5 mg total) by mouth daily. 90 tablet 3  . aspirin 81 MG tablet Take 81 mg by mouth daily.    Marland Kitchen glipiZIDE (GLUCOTROL) 5 MG tablet TAKE 1 TABLET (5 MG TOTAL) BY MOUTH DAILY. 30 tablet 10  . irbesartan (AVAPRO) 150 MG tablet TAKE 1 TABLET (150 MG TOTAL) BY MOUTH AT BEDTIME. 60 tablet 1  . meloxicam (MOBIC) 15 MG tablet Take 1 tablet (15 mg total) by mouth daily as needed for pain. 30 tablet 5  . metFORMIN (GLUCOPHAGE) 500 MG tablet Take 2 tablets (1,000 mg total) by mouth 2 (two) times daily with a meal. 360 tablet 3  . pravastatin (PRAVACHOL) 40 MG tablet TAKE 1 TABLET (40 MG TOTAL) BY MOUTH DAILY. 30 tablet 10   No current  facility-administered medications on file prior to visit.   Review of Systems  Constitutional: Negative for unusual diaphoresis or night sweats HENT: Negative for ringing in ear or discharge Eyes: Negative for double vision or worsening visual disturbance.  Respiratory: Negative for choking and stridor.   Gastrointestinal: Negative for vomiting or other signifcant bowel change Genitourinary: Negative for hematuria or change in urine volume.  Musculoskeletal: Negative for other MSK pain or swelling Skin: Negative for color change and worsening wound.  Neurological: Negative for tremors and numbness other than noted  Psychiatric/Behavioral: Negative for decreased concentration or agitation other than above       Objective:   Physical Exam BP 118/72  mmHg  Pulse 61  Temp(Src) 98.1 F (36.7 C) (Oral)  Resp 18  Ht 5\' 6"  (1.676 m)  Wt 187 lb 1.3 oz (84.859 kg)  BMI 30.21 kg/m2  SpO2 98% VS noted,  Constitutional: Pt appears in no significant distress HENT: Head: NCAT.  Right Ear: External ear normal.  Left Ear: External ear normal.  Eyes: . Pupils are equal, round, and reactive to light. Conjunctivae and EOM are normal Neck: Normal range of motion. Neck supple.  Cardiovascular: Normal rate and regular rhythm.   Pulmonary/Chest: Effort normal and breath sounds without rales or wheezing.  Abd:  Soft, NT, ND, + BS Neurological: Pt is alert. Not confused , motor grossly intact Skin: Skin is warm. No rash, no LE edema Psychiatric: Pt behavior is normal. No agitation.      Assessment & Plan:

## 2015-04-19 ENCOUNTER — Other Ambulatory Visit: Payer: Self-pay | Admitting: Internal Medicine

## 2015-04-20 DIAGNOSIS — E119 Type 2 diabetes mellitus without complications: Secondary | ICD-10-CM | POA: Diagnosis not present

## 2015-05-31 ENCOUNTER — Telehealth: Payer: Self-pay | Admitting: *Deleted

## 2015-05-31 NOTE — Telephone Encounter (Signed)
Patient has appointment on 8/9, needs to have a1c drawn at this visit.

## 2015-06-15 ENCOUNTER — Other Ambulatory Visit: Payer: Self-pay | Admitting: Internal Medicine

## 2015-07-20 ENCOUNTER — Other Ambulatory Visit (INDEPENDENT_AMBULATORY_CARE_PROVIDER_SITE_OTHER): Payer: Medicare Other

## 2015-07-20 ENCOUNTER — Encounter: Payer: Self-pay | Admitting: Internal Medicine

## 2015-07-20 ENCOUNTER — Ambulatory Visit (INDEPENDENT_AMBULATORY_CARE_PROVIDER_SITE_OTHER): Payer: Medicare Other | Admitting: Internal Medicine

## 2015-07-20 VITALS — BP 124/84 | HR 75 | Temp 98.8°F | Ht 66.0 in | Wt 196.0 lb

## 2015-07-20 DIAGNOSIS — E119 Type 2 diabetes mellitus without complications: Secondary | ICD-10-CM

## 2015-07-20 DIAGNOSIS — I10 Essential (primary) hypertension: Secondary | ICD-10-CM | POA: Diagnosis not present

## 2015-07-20 DIAGNOSIS — E785 Hyperlipidemia, unspecified: Secondary | ICD-10-CM

## 2015-07-20 LAB — HEPATIC FUNCTION PANEL
ALT: 30 U/L (ref 0–35)
AST: 22 U/L (ref 0–37)
Albumin: 4.1 g/dL (ref 3.5–5.2)
Alkaline Phosphatase: 80 U/L (ref 39–117)
BILIRUBIN TOTAL: 0.6 mg/dL (ref 0.2–1.2)
Bilirubin, Direct: 0.1 mg/dL (ref 0.0–0.3)
Total Protein: 7.2 g/dL (ref 6.0–8.3)

## 2015-07-20 LAB — TSH: TSH: 1.32 u[IU]/mL (ref 0.35–4.50)

## 2015-07-20 LAB — CBC WITH DIFFERENTIAL/PLATELET
BASOS PCT: 0.8 % (ref 0.0–3.0)
Basophils Absolute: 0 10*3/uL (ref 0.0–0.1)
Eosinophils Absolute: 0.1 10*3/uL (ref 0.0–0.7)
Eosinophils Relative: 2.8 % (ref 0.0–5.0)
HCT: 41.2 % (ref 36.0–46.0)
Hemoglobin: 13.7 g/dL (ref 12.0–15.0)
Lymphocytes Relative: 45 % (ref 12.0–46.0)
Lymphs Abs: 2.3 10*3/uL (ref 0.7–4.0)
MCHC: 33.3 g/dL (ref 30.0–36.0)
MCV: 85.6 fl (ref 78.0–100.0)
MONOS PCT: 9.5 % (ref 3.0–12.0)
Monocytes Absolute: 0.5 10*3/uL (ref 0.1–1.0)
NEUTROS ABS: 2.1 10*3/uL (ref 1.4–7.7)
Neutrophils Relative %: 41.9 % — ABNORMAL LOW (ref 43.0–77.0)
Platelets: 206 10*3/uL (ref 150.0–400.0)
RBC: 4.81 Mil/uL (ref 3.87–5.11)
RDW: 13.4 % (ref 11.5–15.5)
WBC: 5 10*3/uL (ref 4.0–10.5)

## 2015-07-20 LAB — LIPID PANEL
CHOL/HDL RATIO: 3
Cholesterol: 143 mg/dL (ref 0–200)
HDL: 45.4 mg/dL (ref >39.00)
LDL Cholesterol: 80 mg/dL (ref 0–99)
NonHDL: 97.22
TRIGLYCERIDES: 86 mg/dL (ref 0.0–149.0)
VLDL: 17.2 mg/dL (ref 0.0–40.0)

## 2015-07-20 LAB — URINALYSIS, ROUTINE W REFLEX MICROSCOPIC
Bilirubin Urine: NEGATIVE
HGB URINE DIPSTICK: NEGATIVE
Ketones, ur: NEGATIVE
NITRITE: NEGATIVE
RBC / HPF: NONE SEEN (ref 0–?)
Specific Gravity, Urine: 1.01 (ref 1.000–1.030)
TOTAL PROTEIN, URINE-UPE24: NEGATIVE
UROBILINOGEN UA: 0.2 (ref 0.0–1.0)
Urine Glucose: NEGATIVE
pH: 7.5 (ref 5.0–8.0)

## 2015-07-20 LAB — BASIC METABOLIC PANEL
BUN: 14 mg/dL (ref 6–23)
CALCIUM: 9.4 mg/dL (ref 8.4–10.5)
CO2: 27 mEq/L (ref 19–32)
Chloride: 104 mEq/L (ref 96–112)
Creatinine, Ser: 0.9 mg/dL (ref 0.40–1.20)
GFR: 80.04 mL/min (ref >60.00)
Glucose, Bld: 172 mg/dL — ABNORMAL HIGH (ref 70–99)
Potassium: 4.2 mEq/L (ref 3.5–5.1)
SODIUM: 139 meq/L (ref 135–145)

## 2015-07-20 LAB — MICROALBUMIN / CREATININE URINE RATIO
CREATININE, U: 79.4 mg/dL
MICROALB/CREAT RATIO: 0.9 mg/g (ref 0.0–30.0)
Microalb, Ur: <0.7 mg/dL (ref 0.0–1.9)

## 2015-07-20 LAB — HEMOGLOBIN A1C: Hgb A1c MFr Bld: 7.1 % — ABNORMAL HIGH (ref 4.6–6.5)

## 2015-07-20 NOTE — Patient Instructions (Signed)
OK to stop the glucotrol XL 10 mg as you have  Please continue the metformin at 2 pills twice per day  Please continue all other medications as before, and refills have been done if requested.  Please have the pharmacy call with any other refills you may need.  Please continue your efforts at being more active, low cholesterol diet, and weight control.  You are otherwise up to date with prevention measures today.  Please keep your appointments with your specialists as you may have planned  Please go to the LAB in the Basement (turn left off the elevator) for the tests to be done today  You will be contacted by phone if any changes need to be made immediately.  Otherwise, you will receive a letter about your results with an explanation, but please check with MyChart first.  Please remember to sign up for MyChart if you have not done so, as this will be important to you in the future with finding out test results, communicating by private email, and scheduling acute appointments online when needed.  Please return in 6 months, or sooner if needed

## 2015-07-20 NOTE — Progress Notes (Signed)
Subjective:    Patient ID: Andrea Anthony, female    DOB: 01-08-1946, 69 y.o.   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  Pt denies neurological change such as new headache, facial or extremity weakness.  Pt denies polydipsia, polyuria, but has had several low sugar symptoms. Has improved her diet, started having lower sugars, and decrased the metformin from 1000 bid to 500 bid, and even stopped the glucoltrol XL 10 3 days ago.  Pt states overall good compliance with treatment and medications, good tolerability, and has been trying to follow appropriate diet.  Pt denies worsening depressive symptoms, suicidal ideation or panic. No fever, night sweats, wt loss, loss of appetite, or other constitutional symptoms.  Pt states good ability with ADL's, has low fall risk, home safety reviewed and adequate, no other significant changes in hearing or vision, and only occasionally active with exercise. Does have occas lower abd pains fast and fleeting, sharp but Denies worsening reflux, no abd pain, dysphagia, n/v, bowel change or blood. Did have a onetime spot of BRB vaginally 3 mo ago after last seen , none since, has not thought much of it until to bring it up today. Denies urinary symptoms such as dysuria, frequency, urgency, flank pain, hematuria or n/v, fever, chills. Does not want further evaluation necessarily.   Past Medical History  Diagnosis Date  . DIABETES MELLITUS, TYPE II 06/24/2007    Qualifier: Diagnosis of  By: Jenny Reichmann MD, Hunt Oris   . HYPERLIPIDEMIA 06/01/2008    Qualifier: Diagnosis of  By: Jenny Reichmann MD, Shelbie Hutching DEFICIENCY 01/07/2008    Qualifier: Diagnosis of  By: Jenny Reichmann MD, Hunt Oris   . HYPERTENSION 06/24/2007    Qualifier: Diagnosis of  By: Jenny Reichmann MD, Hunt Oris   . ALLERGIC RHINITIS 01/07/2008    Qualifier:  Diagnosis of  By: Jenny Reichmann MD, Hunt Oris   . GERD 05/24/2009    Qualifier: Diagnosis of  By: Jenny Reichmann MD, Hunt Oris   . DIVERTICULOSIS, COLON 01/07/2008    Qualifier: Diagnosis of  By: Jenny Reichmann MD, Hunt Oris   . PULMONARY NODULE, RIGHT LOWER LOBE 01/09/2008    Qualifier: Diagnosis of  By: Jenny Reichmann MD, Hunt Oris   . 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 Umbilical hernia 62/02/5596    Fat containing on CT 2008  . Lumbar disc disease 10/08/2012    l4-5 per CT 2008  . Empty sella 03/17/2013   No past surgical history on file.  reports that she has never smoked. She has never used smokeless tobacco. She reports that she does not drink alcohol or use illicit drugs. family history includes Hypertension in her father. Allergies  Allergen Reactions  . Ace Inhibitors     REACTION: cough  . Sulfonamide Derivatives    Current Outpatient Prescriptions on File Prior to Visit  Medication Sig Dispense Refill  . amLODipine (NORVASC) 5 MG tablet TAKE 1 TABLET (5 MG TOTAL) BY MOUTH DAILY. 90 tablet 2  . aspirin 81 MG tablet Take 81 mg by mouth daily.    Marland Kitchen glipiZIDE (GLUCOTROL XL) 10 MG 24 hr tablet Take 1 tablet (10 mg total) by mouth daily with breakfast. 90 tablet 3  . irbesartan (AVAPRO) 150 MG tablet TAKE 1 TABLET (150 MG TOTAL) BY MOUTH AT BEDTIME. 60 tablet 1  .  meloxicam (MOBIC) 15 MG tablet Take 1 tablet (15 mg total) by mouth daily as needed for pain. 30 tablet 5  . metFORMIN (GLUCOPHAGE) 500 MG tablet Take 2 tablets (1,000 mg total) by mouth 2 (two) times daily with a meal. 360 tablet 3  . pravastatin (PRAVACHOL) 40 MG tablet TAKE 1 TABLET (40 MG TOTAL) BY MOUTH DAILY. 30 tablet 10   No current facility-administered medications on file prior to visit.   Review of Systems Constitutional: Negative for increased diaphoresis, other activity, appetite or siginficant weight change other than noted HENT: Negative for worsening hearing loss, ear pain, facial swelling, mouth sores and neck stiffness.   Eyes: Negative  for other worsening pain, redness or visual disturbance.  Respiratory: Negative for shortness of breath and wheezing  Cardiovascular: Negative for chest pain and palpitations.  Gastrointestinal: Negative for diarrhea, blood in stool, abdominal distention or other pain Genitourinary: Negative for hematuria, flank pain or change in urine volume.  Musculoskeletal: Negative for myalgias or other joint complaints.  Skin: Negative for color change and wound or drainage.  Neurological: Negative for syncope and numbness. other than noted Hematological: Negative for adenopathy. or other swelling Psychiatric/Behavioral: Negative for hallucinations, SI, self-injury, decreased concentration or other worsening agitation.      Objective:   Physical Exam BP 124/84 mmHg  Pulse 75  Temp(Src) 98.8 F (37.1 C) (Oral)  Ht 5\' 6"  (1.676 m)  Wt 196 lb (88.905 kg)  BMI 31.65 kg/m2  SpO2 97% VS noted,  Constitutional: Pt is oriented to person, place, and time. Appears well-developed and well-nourished, in no significant distress Head: Normocephalic and atraumatic.  Right Ear: External ear normal.  Left Ear: External ear normal.  Nose: Nose normal.  Mouth/Throat: Oropharynx is clear and moist.  Eyes: Conjunctivae and EOM are normal. Pupils are equal, round, and reactive to light.  Neck: Normal range of motion. Neck supple. No JVD present. No tracheal deviation present or significant neck LA or mass Cardiovascular: Normal rate, regular rhythm, normal heart sounds and intact distal pulses.   Pulmonary/Chest: Effort normal and breath sounds without rales or wheezing  Abdominal: Soft. Bowel sounds are normal. NT. No HSM  Musculoskeletal: Normal range of motion. Exhibits no edema.  Lymphadenopathy:  Has no cervical adenopathy.  Neurological: Pt is alert and oriented to person, place, and time. Pt has normal reflexes. No cranial nerve deficit. Motor grossly intact Skin: Skin is warm and dry. No rash noted.    Psychiatric:  Has normal mood and affect. Behavior is normal.     Assessment & Plan:

## 2015-07-20 NOTE — Assessment & Plan Note (Signed)
With several low sugars after meds increased last visit, but now watching her diet more closely; to d/c the glucotrol XL as she has done, ok for the metformin 1000 bid but to watch for any further abd pains or even diarrhea that could be related side effect, for labs today Lab Results  Component Value Date   HGBA1C 8.8* 01/19/2015

## 2015-07-20 NOTE — Progress Notes (Signed)
Pre visit review using our clinic review tool, if applicable. No additional management support is needed unless otherwise documented below in the visit note. 

## 2015-07-20 NOTE — Assessment & Plan Note (Signed)
stable overall by history and exam, recent data reviewed with pt, and pt to continue medical treatment as before,  to f/u any worsening symptoms or concerns Lab Results  Component Value Date   LDLCALC 77 01/19/2015

## 2015-07-20 NOTE — Assessment & Plan Note (Signed)
stable overall by history and exam, recent data reviewed with pt, and pt to continue medical treatment as before,  to f/u any worsening symptoms or concerns BP Readings from Last 3 Encounters:  07/20/15 124/84  04/14/15 118/72  02/04/15 124/80

## 2015-09-02 ENCOUNTER — Other Ambulatory Visit: Payer: Self-pay

## 2015-09-02 MED ORDER — METFORMIN HCL 500 MG PO TABS: 1000.0000 mg | ORAL_TABLET | Freq: Two times a day (BID) | ORAL | Status: DC

## 2015-09-06 ENCOUNTER — Telehealth: Payer: Self-pay | Admitting: *Deleted

## 2015-09-06 MED ORDER — METFORMIN HCL 500 MG PO TABS: 1000.0000 mg | ORAL_TABLET | Freq: Two times a day (BID) | ORAL | Status: DC

## 2015-09-06 NOTE — Telephone Encounter (Signed)
Left msg on triage stating md was going to send rx on mother diabetic med. [facility] stated no prescription has been sent. MD sent glucophage 09/02/15 will resend to [facility]...Johny Chess

## 2015-11-09 ENCOUNTER — Encounter: Payer: Self-pay | Admitting: Internal Medicine

## 2015-11-09 ENCOUNTER — Other Ambulatory Visit (INDEPENDENT_AMBULATORY_CARE_PROVIDER_SITE_OTHER): Payer: Medicare Other

## 2015-11-09 ENCOUNTER — Ambulatory Visit (INDEPENDENT_AMBULATORY_CARE_PROVIDER_SITE_OTHER): Payer: Medicare Other | Admitting: Internal Medicine

## 2015-11-09 VITALS — BP 124/80 | HR 75 | Temp 97.9°F | Ht 66.0 in | Wt 195.0 lb

## 2015-11-09 DIAGNOSIS — R1030 Lower abdominal pain, unspecified: Secondary | ICD-10-CM

## 2015-11-09 DIAGNOSIS — Z23 Encounter for immunization: Secondary | ICD-10-CM | POA: Diagnosis not present

## 2015-11-09 DIAGNOSIS — I10 Essential (primary) hypertension: Secondary | ICD-10-CM | POA: Diagnosis not present

## 2015-11-09 DIAGNOSIS — E119 Type 2 diabetes mellitus without complications: Secondary | ICD-10-CM | POA: Diagnosis not present

## 2015-11-09 DIAGNOSIS — R109 Unspecified abdominal pain: Secondary | ICD-10-CM | POA: Insufficient documentation

## 2015-11-09 LAB — LIPID PANEL
CHOL/HDL RATIO: 3
Cholesterol: 133 mg/dL (ref 0–200)
HDL: 45.7 mg/dL (ref >39.00)
LDL CALC: 67 mg/dL (ref 0–99)
NonHDL: 87.23
TRIGLYCERIDES: 101 mg/dL (ref 0.0–149.0)
VLDL: 20.2 mg/dL (ref 0.0–40.0)

## 2015-11-09 LAB — CBC WITH DIFFERENTIAL/PLATELET
BASOS ABS: 0.1 10*3/uL (ref 0.0–0.1)
Basophils Relative: 0.8 % (ref 0.0–3.0)
EOS PCT: 1.4 % (ref 0.0–5.0)
Eosinophils Absolute: 0.1 10*3/uL (ref 0.0–0.7)
HEMATOCRIT: 40.2 % (ref 36.0–46.0)
HEMOGLOBIN: 13.3 g/dL (ref 12.0–15.0)
LYMPHS ABS: 2.9 10*3/uL (ref 0.7–4.0)
LYMPHS PCT: 41.6 % (ref 12.0–46.0)
MCHC: 33.2 g/dL (ref 30.0–36.0)
MCV: 85.6 fl (ref 78.0–100.0)
MONOS PCT: 8 % (ref 3.0–12.0)
Monocytes Absolute: 0.5 10*3/uL (ref 0.1–1.0)
NEUTROS PCT: 48.2 % (ref 43.0–77.0)
Neutro Abs: 3.3 10*3/uL (ref 1.4–7.7)
Platelets: 227 10*3/uL (ref 150.0–400.0)
RBC: 4.69 Mil/uL (ref 3.87–5.11)
RDW: 12.8 % (ref 11.5–15.5)
WBC: 6.9 10*3/uL (ref 4.0–10.5)

## 2015-11-09 LAB — BASIC METABOLIC PANEL
BUN: 14 mg/dL (ref 6–23)
CALCIUM: 9.6 mg/dL (ref 8.4–10.5)
CO2: 29 meq/L (ref 19–32)
CREATININE: 0.83 mg/dL (ref 0.40–1.20)
Chloride: 103 mEq/L (ref 96–112)
GFR: 87.8 mL/min (ref >60.00)
GLUCOSE: 164 mg/dL — AB (ref 70–99)
Potassium: 4.2 mEq/L (ref 3.5–5.1)
SODIUM: 138 meq/L (ref 135–145)

## 2015-11-09 LAB — HEPATIC FUNCTION PANEL
ALBUMIN: 4.1 g/dL (ref 3.5–5.2)
ALK PHOS: 69 U/L (ref 39–117)
ALT: 20 U/L (ref 0–35)
AST: 16 U/L (ref 0–37)
Bilirubin, Direct: 0 mg/dL (ref 0.0–0.3)
TOTAL PROTEIN: 7.1 g/dL (ref 6.0–8.3)
Total Bilirubin: 0.5 mg/dL (ref 0.2–1.2)

## 2015-11-09 LAB — URINALYSIS, ROUTINE W REFLEX MICROSCOPIC
BILIRUBIN URINE: NEGATIVE
Hgb urine dipstick: NEGATIVE
KETONES UR: NEGATIVE
NITRITE: NEGATIVE
SPECIFIC GRAVITY, URINE: 1.015 (ref 1.000–1.030)
Total Protein, Urine: NEGATIVE
URINE GLUCOSE: NEGATIVE
Urobilinogen, UA: 0.2 (ref 0.0–1.0)
pH: 7 (ref 5.0–8.0)

## 2015-11-09 LAB — LIPASE: LIPASE: 97 U/L — AB (ref 11.0–59.0)

## 2015-11-09 LAB — HEMOGLOBIN A1C: Hgb A1c MFr Bld: 7.2 % — ABNORMAL HIGH (ref 4.6–6.5)

## 2015-11-09 NOTE — Assessment & Plan Note (Signed)
Etiology unclear, no pain at this time, ? Constipation related, ok for trial OTC miralax and /or stool softners, for UA and labs today,  to f/u any worsening symptoms or concerns

## 2015-11-09 NOTE — Assessment & Plan Note (Signed)
stable overall by history and exam, recent data reviewed with pt, and pt to continue medical treatment as before,  to f/u any worsening symptoms or concerns BP Readings from Last 3 Encounters:  11/09/15 124/80  07/20/15 124/84  04/14/15 118/72

## 2015-11-09 NOTE — Progress Notes (Signed)
Pre visit review using our clinic review tool, if applicable. No additional management support is needed unless otherwise documented below in the visit note. 

## 2015-11-09 NOTE — Progress Notes (Signed)
Subjective:    Patient ID: Andrea Anthony, female    DOB: May 18, 1946, 69 y.o.   MRN: [id number]  HPI  Here with 2 wks onset mild intermitent wax and wane discomfort to mild lower abd, with some radiation to the back, but Denies urinary symptoms such as dysuria, frequency, urgency, flank pain, hematuria or n/v, fever, chills.  Has had intermittent constipation as well, but not clear if the pain is better after BM's. No recent laxative or stool softner trial. Denies worsening reflux, other abd pain, dysphagia, n/v, bowel change or blood. No pain at the moment.  Last abd u/s 2013 neg for acute.  Pt denies chest pain, increased sob or doe, wheezing, orthopnea, PND, increased LE swelling, palpitations, dizziness or syncope.  Pt denies new neurological symptoms such as new headache, or facial or extremity weakness or numbness   Pt denies polydipsia, polyuria,  Pt states overall good compliance with meds, trying to follow lower cholesterol, diabetic diet, wt overall stable but little exercise however.    Past Medical History  Diagnosis Date  . DIABETES MELLITUS, TYPE II 06/24/2007    Qualifier: Diagnosis of  By: Jenny Reichmann MD, Hunt Oris   . HYPERLIPIDEMIA 06/01/2008    Qualifier: Diagnosis of  By: Jenny Reichmann MD, Shelbie Hutching DEFICIENCY 01/07/2008    Qualifier: Diagnosis of  By: Jenny Reichmann MD, Hunt Oris   . HYPERTENSION 06/24/2007    Qualifier: Diagnosis of  By: Jenny Reichmann MD, Hunt Oris   . ALLERGIC RHINITIS 01/07/2008    Qualifier: Diagnosis of  By: Jenny Reichmann MD, Hunt Oris   . GERD 05/24/2009    Qualifier: Diagnosis of  By: Jenny Reichmann MD, Hunt Oris   . DIVERTICULOSIS, COLON 01/07/2008    Qualifier: Diagnosis of  By: Jenny Reichmann MD, Hunt Oris   . PULMONARY NODULE, RIGHT LOWER LOBE 01/09/2008    Qualifier: Diagnosis of  By: Jenny Reichmann MD, Hunt Oris   . MALT lymphoma ([place]) 06/08/2012    Gastric, 2001  . Umbilical hernia 0000000    Fat containing on CT 2008  . Lumbar disc disease 10/08/2012    l4-5 per CT 2008  . Empty sella ([place]) 03/17/2013   No  past surgical history on file.  reports that she has never smoked. She has never used smokeless tobacco. She reports that she does not drink alcohol or use illicit drugs. family history includes Hypertension in her father. Allergies  Allergen Reactions  . Ace Inhibitors     REACTION: cough  . Sulfonamide Derivatives    Current Outpatient Prescriptions on File Prior to Visit  Medication Sig Dispense Refill  . amLODipine (NORVASC) 5 MG tablet TAKE 1 TABLET (5 MG TOTAL) BY MOUTH DAILY. 90 tablet 2  . aspirin 81 MG tablet Take 81 mg by mouth daily.    . irbesartan (AVAPRO) 150 MG tablet TAKE 1 TABLET (150 MG TOTAL) BY MOUTH AT BEDTIME. 60 tablet 1  . meloxicam (MOBIC) 15 MG tablet Take 1 tablet (15 mg total) by mouth daily as needed for pain. 30 tablet 5  . metFORMIN (GLUCOPHAGE) 500 MG tablet Take 2 tablets (1,000 mg total) by mouth 2 (two) times daily with a meal. 360 tablet 3  . pravastatin (PRAVACHOL) 40 MG tablet TAKE 1 TABLET (40 MG TOTAL) BY MOUTH DAILY. 30 tablet 10   No current facility-administered medications on file prior to visit.   Review of Systems  Constitutional: Negative for unusual diaphoresis or night sweats HENT: Negative for ringing in ear or discharge  Eyes: Negative for double vision or worsening visual disturbance.  Respiratory: Negative for choking and stridor.   Gastrointestinal: Negative for vomiting or other signifcant bowel change Genitourinary: Negative for hematuria or change in urine volume.  Musculoskeletal: Negative for other MSK pain or swelling Skin: Negative for color change and worsening wound.  Neurological: Negative for tremors and numbness other than noted  Psychiatric/Behavioral: Negative for decreased concentration or agitation other than above       Objective:   Physical Exam BP 124/80 mmHg  Pulse 75  Temp(Src) 97.9 F (36.6 C) (Oral)  Ht 5\' 6"  (1.676 m)  Wt 195 lb (88.451 kg)  BMI 31.49 kg/m2  SpO2 96% VS noted,  Constitutional: Pt  appears in no significant distress HENT: Head: NCAT.  Right Ear: External ear normal.  Left Ear: External ear normal.  Eyes: . Pupils are equal, round, and reactive to light. Conjunctivae and EOM are normal Neck: Normal range of motion. Neck supple.  Cardiovascular: Normal rate and regular rhythm.   Pulmonary/Chest: Effort normal and breath sounds without rales or wheezing.  Abd:  Soft, NT, ND, + BS Neurological: Pt is alert. Not confused , motor grossly intact Skin: Skin is warm. No rash, no LE edema Psychiatric: Pt behavior is normal. No agitation.     Assessment & Plan:

## 2015-11-09 NOTE — Assessment & Plan Note (Signed)
stable overall by history and exam, recent data reviewed with pt, and pt to continue medical treatment as before,  to f/u any worsening symptoms or concerns Lab Results  Component Value Date   HGBA1C 7.1* 07/20/2015   For f/u lab

## 2015-11-09 NOTE — Patient Instructions (Addendum)
You had the flu shot today  Please continue all other medications as before, and refills have been done if requested.  OK to try OTC stool softners and/or Miralax for constipation as needed  Please have the pharmacy call with any other refills you may need.  Please continue your efforts at being more active, low cholesterol diet, and weight control.  Please keep your appointments with your specialists as you may have planned  Please go to the LAB in the Basement (turn left off the elevator) for the tests to be done today  You will be contacted by phone if any changes need to be made immediately.  Otherwise, you will receive a letter about your results with an explanation, but please check with MyChart first.  Please remember to sign up for MyChart if you have not done so, as this will be important to you in the future with finding out test results, communicating by private email, and scheduling acute appointments online when needed.  Please return in 6 months, or sooner if needed  OK to cancel appointments with me before 6 mo, unless you would rather keep it

## 2016-01-12 ENCOUNTER — Other Ambulatory Visit: Payer: Self-pay | Admitting: Internal Medicine

## 2016-01-25 ENCOUNTER — Ambulatory Visit: Payer: Medicare Other | Admitting: Internal Medicine

## 2016-02-02 ENCOUNTER — Encounter: Payer: Self-pay | Admitting: Internal Medicine

## 2016-05-09 ENCOUNTER — Encounter: Payer: Self-pay | Admitting: Internal Medicine

## 2016-05-09 ENCOUNTER — Ambulatory Visit (INDEPENDENT_AMBULATORY_CARE_PROVIDER_SITE_OTHER): Payer: Medicare Other | Admitting: Internal Medicine

## 2016-05-09 ENCOUNTER — Other Ambulatory Visit (INDEPENDENT_AMBULATORY_CARE_PROVIDER_SITE_OTHER): Payer: Medicare Other

## 2016-05-09 VITALS — BP 130/78 | HR 66 | Temp 98.6°F | Resp 20 | Wt 191.0 lb

## 2016-05-09 DIAGNOSIS — R6889 Other general symptoms and signs: Secondary | ICD-10-CM | POA: Diagnosis not present

## 2016-05-09 DIAGNOSIS — E119 Type 2 diabetes mellitus without complications: Secondary | ICD-10-CM | POA: Diagnosis not present

## 2016-05-09 DIAGNOSIS — I10 Essential (primary) hypertension: Secondary | ICD-10-CM

## 2016-05-09 DIAGNOSIS — Z0001 Encounter for general adult medical examination with abnormal findings: Secondary | ICD-10-CM

## 2016-05-09 DIAGNOSIS — M519 Unspecified thoracic, thoracolumbar and lumbosacral intervertebral disc disorder: Secondary | ICD-10-CM

## 2016-05-09 DIAGNOSIS — Z1159 Encounter for screening for other viral diseases: Secondary | ICD-10-CM

## 2016-05-09 DIAGNOSIS — E785 Hyperlipidemia, unspecified: Secondary | ICD-10-CM

## 2016-05-09 LAB — URINALYSIS, ROUTINE W REFLEX MICROSCOPIC
BILIRUBIN URINE: NEGATIVE
HGB URINE DIPSTICK: NEGATIVE
KETONES UR: NEGATIVE
NITRITE: NEGATIVE
PH: 6.5 (ref 5.0–8.0)
Specific Gravity, Urine: 1.01 (ref 1.000–1.030)
TOTAL PROTEIN, URINE-UPE24: NEGATIVE
UROBILINOGEN UA: 0.2 (ref 0.0–1.0)
Urine Glucose: NEGATIVE

## 2016-05-09 LAB — CBC WITH DIFFERENTIAL/PLATELET
BASOS PCT: 0.8 % (ref 0.0–3.0)
Basophils Absolute: 0.1 10*3/uL (ref 0.0–0.1)
EOS PCT: 7.7 % — AB (ref 0.0–5.0)
Eosinophils Absolute: 0.5 10*3/uL (ref 0.0–0.7)
HCT: 39.2 % (ref 36.0–46.0)
Hemoglobin: 12.9 g/dL (ref 12.0–15.0)
LYMPHS ABS: 2.7 10*3/uL (ref 0.7–4.0)
Lymphocytes Relative: 41.1 % (ref 12.0–46.0)
MCHC: 33 g/dL (ref 30.0–36.0)
MCV: 85.2 fl (ref 78.0–100.0)
MONO ABS: 0.5 10*3/uL (ref 0.1–1.0)
MONOS PCT: 7.5 % (ref 3.0–12.0)
NEUTROS PCT: 42.9 % — AB (ref 43.0–77.0)
Neutro Abs: 2.8 10*3/uL (ref 1.4–7.7)
Platelets: 223 10*3/uL (ref 150.0–400.0)
RBC: 4.59 Mil/uL (ref 3.87–5.11)
RDW: 13.7 % (ref 11.5–15.5)
WBC: 6.5 10*3/uL (ref 4.0–10.5)

## 2016-05-09 LAB — BASIC METABOLIC PANEL
BUN: 15 mg/dL (ref 6–23)
CO2: 26 mEq/L (ref 19–32)
Calcium: 9.7 mg/dL (ref 8.4–10.5)
Chloride: 105 mEq/L (ref 96–112)
Creatinine, Ser: 1 mg/dL (ref 0.40–1.20)
GFR: 70.71 mL/min (ref >60.00)
Glucose, Bld: 154 mg/dL — ABNORMAL HIGH (ref 70–99)
Potassium: 4.2 mEq/L (ref 3.5–5.1)
Sodium: 139 mEq/L (ref 135–145)

## 2016-05-09 LAB — HEPATIC FUNCTION PANEL
ALT: 24 U/L (ref 0–35)
AST: 20 U/L (ref 0–37)
Albumin: 4.1 g/dL (ref 3.5–5.2)
Alkaline Phosphatase: 76 U/L (ref 39–117)
Bilirubin, Direct: 0.1 mg/dL (ref 0.0–0.3)
Total Bilirubin: 0.6 mg/dL (ref 0.2–1.2)
Total Protein: 6.9 g/dL (ref 6.0–8.3)

## 2016-05-09 LAB — LIPID PANEL
Cholesterol: 131 mg/dL (ref 0–200)
HDL: 46.6 mg/dL (ref >39.00)
LDL Cholesterol: 68 mg/dL (ref 0–99)
NonHDL: 84.25
Total CHOL/HDL Ratio: 3
Triglycerides: 80 mg/dL (ref 0.0–149.0)
VLDL: 16 mg/dL (ref 0.0–40.0)

## 2016-05-09 LAB — MICROALBUMIN / CREATININE URINE RATIO
CREATININE, U: 74.6 mg/dL
MICROALB/CREAT RATIO: 0.9 mg/g (ref 0.0–30.0)
Microalb, Ur: <0.7 mg/dL (ref 0.0–1.9)

## 2016-05-09 LAB — TSH: TSH: 1.08 u[IU]/mL (ref 0.35–4.50)

## 2016-05-09 LAB — HEMOGLOBIN A1C: Hgb A1c MFr Bld: 7.5 % — ABNORMAL HIGH (ref 4.6–6.5)

## 2016-05-09 LAB — HEPATITIS C ANTIBODY: HCV AB: NEGATIVE

## 2016-05-09 MED ORDER — GLIPIZIDE ER 2.5 MG PO TB24: 2.5000 mg | ORAL_TABLET | Freq: Every day | ORAL | Status: DC

## 2016-05-09 NOTE — Progress Notes (Signed)
Pre visit review using our clinic review tool, if applicable. No additional management support is needed unless otherwise documented below in the visit note. 

## 2016-05-09 NOTE — Assessment & Plan Note (Signed)
stable overall by history and exam, recent data reviewed with pt, and pt to continue medical treatment as before,  to f/u any worsening symptoms or concerns Lab Results  Component Value Date   LDLCALC 67 11/09/2015

## 2016-05-09 NOTE — Assessment & Plan Note (Signed)
With intermitent pain without radicular symtpoms, cont nsaid prn,  to f/u any worsening symptoms or concerns

## 2016-05-09 NOTE — Assessment & Plan Note (Signed)

## 2016-05-09 NOTE — Patient Instructions (Signed)
Please take all new medication as prescribed - the new medication for diabetes  Please continue all other medications as before, and refills have been done if requested.  Please have the pharmacy call with any other refills you may need.  Please continue your efforts at being more active, low cholesterol diet, and weight control.  You are otherwise up to date with prevention measures today.  Please keep your appointments with your specialists as you may have planned  Please go to the LAB in the Basement (turn left off the elevator) for the tests to be done today  You will be contacted by phone if any changes need to be made immediately.  Otherwise, you will receive a letter about your results with an explanation, but please check with MyChart first.  Please remember to sign up for MyChart if you have not done so, as this will be important to you in the future with finding out test results, communicating by private email, and scheduling acute appointments online when needed.  Please return in 6 months, or sooner if needed, with Lab testing done 3-5 days before

## 2016-05-09 NOTE — Assessment & Plan Note (Addendum)
Uncontrolled, to add glipizide 2.5 qd, o/w stable overall by history and exam, recent data reviewed with pt, and pt to continue medical treatment as before,  to f/u any worsening symptoms or concerns Lab Results  Component Value Date   HGBA1C 7.2* 11/09/2015   In addition to the time spent performing CPE, I spent an additional 40 minutes face to face,in which greater than 50% of this time was spent in counseling and coordination of care for patient's acute illness as documented.

## 2016-05-09 NOTE — Assessment & Plan Note (Signed)
stable overall by history and exam, recent data reviewed with pt, and pt to continue medical treatment as before,  to f/u any worsening symptoms or concerns BP Readings from Last 3 Encounters:  05/09/16 130/78  11/09/15 124/80  07/20/15 124/84

## 2016-05-09 NOTE — Progress Notes (Signed)
Subjective:    Patient ID: Andrea Anthony, female    DOB: [birth year], 70 y.o.   MRN: [id number]  HPI  Here for wellness and f/u with daughter as interpretor;  Overall doing ok;  Pt denies Chest pain, worsening SOB, DOE, wheezing, orthopnea, PND, worsening LE edema, palpitations, dizziness or syncope.  Pt denies neurological change such as new headache, facial or extremity weakness.  Pt denies polydipsia, polyuria, or low sugar symptoms. Pt states overall good compliance with treatment and medications, good tolerability, and has been trying to follow appropriate diet.  Pt denies worsening depressive symptoms, suicidal ideation or panic. No fever, night sweats, wt loss, loss of appetite, or other constitutional symptoms.  Pt states good ability with ADL's, has low fall risk, home safety reviewed and adequate, no other significant changes in hearing or vision, and only occasionally active with exercise.  Due but sched for June 2017 colonoscopy with Dr Henrene Pastor.  Pt continues to have recurring LBP and neck pain without change in severity, bowel or bladder change, fever, wt loss,  worsening LE pain/numbness/weakness, gait change or falls. CBGs recently have averaging about 150's.   Pt denies polydipsia, polyuria, or low sugar symptoms such as weakness or confusion improved with po intake.  Pt states overall good compliance with meds, trying to follow lower cholesterol, diabetic diet Past Medical History  Diagnosis Date  . DIABETES MELLITUS, TYPE II 06/24/2007    Qualifier: Diagnosis of  By: Jenny Reichmann MD, Hunt Oris   . HYPERLIPIDEMIA 06/01/2008    Qualifier: Diagnosis of  By: Jenny Reichmann MD, Shelbie Hutching DEFICIENCY 01/07/2008    Qualifier: Diagnosis of  By: Jenny Reichmann MD, Hunt Oris   . HYPERTENSION 06/24/2007    Qualifier: Diagnosis of  By: Jenny Reichmann MD, Hunt Oris   . ALLERGIC RHINITIS 01/07/2008    Qualifier: Diagnosis of  By: Jenny Reichmann MD, Hunt Oris   . GERD 05/24/2009    Qualifier: Diagnosis of  By: Jenny Reichmann MD, Hunt Oris   .  DIVERTICULOSIS, COLON 01/07/2008    Qualifier: Diagnosis of  By: Jenny Reichmann MD, Hunt Oris   . PULMONARY NODULE, RIGHT LOWER LOBE 01/09/2008    Qualifier: Diagnosis of  By: Jenny Reichmann MD, Hunt Oris   . MALT lymphoma ([place]) 06/08/2012    Gastric, 2001  . Umbilical hernia 0000000    Fat containing on CT 2008  . Lumbar disc disease 10/08/2012    l4-5 per CT 2008  . Empty sella ([place]) 03/17/2013   No past surgical history on file.  reports that she has never smoked. She has never used smokeless tobacco. She reports that she does not drink alcohol or use illicit drugs. family history includes Hypertension in her father. Allergies  Allergen Reactions  . Ace Inhibitors     REACTION: cough  . Sulfonamide Derivatives    Current Outpatient Prescriptions on File Prior to Visit  Medication Sig Dispense Refill  . amLODipine (NORVASC) 5 MG tablet TAKE 1 TABLET (5 MG TOTAL) BY MOUTH DAILY. 90 tablet 1  . aspirin 81 MG tablet Take 81 mg by mouth daily.    . irbesartan (AVAPRO) 150 MG tablet TAKE 1 TABLET (150 MG TOTAL) BY MOUTH AT BEDTIME. 60 tablet 1  . meloxicam (MOBIC) 15 MG tablet Take 1 tablet (15 mg total) by mouth daily as needed for pain. 30 tablet 5  . metFORMIN (GLUCOPHAGE) 500 MG tablet Take 2 tablets (1,000 mg total) by mouth 2 (two) times daily with a meal. 360 tablet 3  .  pravastatin (PRAVACHOL) 40 MG tablet TAKE 1 TABLET (40 MG TOTAL) BY MOUTH DAILY. 30 tablet 10   No current facility-administered medications on file prior to visit.     Review of Systems Constitutional: Negative for increased diaphoresis, or other activity, appetite or siginficant weight change other than noted HENT: Negative for worsening hearing loss, ear pain, facial swelling, mouth sores and neck stiffness.   Eyes: Negative for other worsening pain, redness or visual disturbance.  Respiratory: Negative for choking or stridor Cardiovascular: Negative for other chest pain and palpitations.  Gastrointestinal: Negative for  worsening diarrhea, blood in stool, or abdominal distention Genitourinary: Negative for hematuria, flank pain or change in urine volume.  Musculoskeletal: Negative for myalgias or other joint complaints.  Skin: Negative for other color change and wound or drainage.  Neurological: Negative for syncope and numbness. other than noted Hematological: Negative for adenopathy. or other swelling Psychiatric/Behavioral: Negative for hallucinations, SI, self-injury, decreased concentration or other worsening agitation.      Objective:   Physical Exam BP 130/78 mmHg  Pulse 66  Temp(Src) 98.6 F (37 C) (Oral)  Resp 20  Wt 191 lb (86.637 kg)  SpO2 97% VS noted, not ill appearing Constitutional: Pt is oriented to person, place, and time. Appears well-developed and well-nourished, in no significant distress Head: Normocephalic and atraumatic  Eyes: Conjunctivae and EOM are normal. Pupils are equal, round, and reactive to light Right Ear: External ear normal.  Left Ear: External ear normal Nose: Nose normal.  Mouth/Throat: Oropharynx is clear and moist  Neck: Normal range of motion. Neck supple. No JVD present. No tracheal deviation present or significant neck LA or mass Cardiovascular: Normal rate, regular rhythm, normal heart sounds and intact distal pulses.   Pulmonary/Chest: Effort normal and breath sounds without rales or wheezing  Abdominal: Soft. Bowel sounds are normal. NT. No HSM  Musculoskeletal: Normal range of motion. Exhibits no edema Lymphadenopathy: Has no cervical adenopathy.  Neurological: Pt is alert and oriented to person, place, and time. Pt has normal reflexes. No cranial nerve deficit. Motor grossly intact Spine nontender Skin: Skin is warm and dry. No rash noted or new ulcers Psychiatric:  Has normal mood and affect. Behavior is normal.      Assessment & Plan:

## 2016-05-11 ENCOUNTER — Other Ambulatory Visit: Payer: Self-pay | Admitting: Internal Medicine

## 2016-05-23 ENCOUNTER — Ambulatory Visit (AMBULATORY_SURGERY_CENTER): Payer: Self-pay

## 2016-05-23 VITALS — Ht 66.0 in | Wt 195.6 lb

## 2016-05-23 DIAGNOSIS — Z1211 Encounter for screening for malignant neoplasm of colon: Secondary | ICD-10-CM

## 2016-05-23 MED ORDER — NA SULFATE-K SULFATE-MG SULF 17.5-3.13-1.6 GM/177ML PO SOLN: ORAL | Status: DC

## 2016-05-23 NOTE — Progress Notes (Signed)
Pt came into the office today for her pre-visit prior to her colonoscopy with Dr Henrene Pastor on 06/06/16 . Her daughter Adair Laundry) was with pt.Pt understands very little English, but I read and explained all paperwork and prep instructions to both pt and the daughter.The daughter will be with the pt at her appt to help with the paperwork.She will call our office if they have any further questions.

## 2016-05-24 ENCOUNTER — Encounter: Payer: Self-pay | Admitting: Internal Medicine

## 2016-06-06 ENCOUNTER — Ambulatory Visit (AMBULATORY_SURGERY_CENTER): Payer: Medicare Other | Admitting: Internal Medicine

## 2016-06-06 ENCOUNTER — Encounter: Payer: Self-pay | Admitting: Internal Medicine

## 2016-06-06 VITALS — BP 144/83 | HR 63 | Temp 96.6°F | Resp 17 | Ht 66.0 in | Wt 195.0 lb

## 2016-06-06 DIAGNOSIS — Z1211 Encounter for screening for malignant neoplasm of colon: Secondary | ICD-10-CM | POA: Diagnosis present

## 2016-06-06 DIAGNOSIS — D125 Benign neoplasm of sigmoid colon: Secondary | ICD-10-CM

## 2016-06-06 DIAGNOSIS — D124 Benign neoplasm of descending colon: Secondary | ICD-10-CM

## 2016-06-06 MED ORDER — SODIUM CHLORIDE 0.9 % IV SOLN: 500.0000 mL | INTRAVENOUS | Status: DC

## 2016-06-06 NOTE — Op Note (Signed)
[facility] Patient Name: Andrea Anthony Procedure Date: 06/06/2016 8:16 AM MRN: [id number] Endoscopist: Docia Chuck. Henrene Pastor , MD Age: 70 Referring MD:  Date of Birth: 1946-05-25 Gender: Female Account #: [id number] Procedure:                Colonoscopy, with cold snare polypectomy X2 Indications:              Screening for colorectal malignant neoplasm. Normal                            index examination 2007 Medicines:                Monitored Anesthesia Care Procedure:                Pre-Anesthesia Assessment:                           - Prior to the procedure, a History and Physical                            was performed, and patient medications and                            allergies were reviewed. The patient's tolerance of                            previous anesthesia was also reviewed. The risks                            and benefits of the procedure and the sedation                            options and risks were discussed with the patient.                            All questions were answered, and informed consent                            was obtained. Prior Anticoagulants: The patient has                            taken no previous anticoagulant or antiplatelet                            agents. ASA Grade Assessment: II - A patient with                            mild systemic disease. After reviewing the risks                            and benefits, the patient was deemed in                            satisfactory condition to undergo the procedure.  After obtaining informed consent, the colonoscope                            was passed under direct vision. Throughout the                            procedure, the patient's blood pressure, pulse, and                            oxygen saturations were monitored continuously. The                            Model CF-HQ190L [id number]) scope was introduced   through the anus and advanced to the the cecum,                            identified by appendiceal orifice and ileocecal                            valve. The terminal ileum, ileocecal valve,                            appendiceal orifice, and rectum were photographed.                            The quality of the bowel preparation was excellent.                            The colonoscopy was performed without difficulty.                            The patient tolerated the procedure well. The bowel                            preparation used was SUPREP. Scope In: 8:24:13 AM Scope Out: 8:39:30 AM Scope Withdrawal Time: 0 hours 12 minutes 6 seconds  Total Procedure Duration: 0 hours 15 minutes 17 seconds  Findings:                 The terminal ileum appeared normal.                           Two pedunculated polyps were found in the sigmoid                            colon and descending colon. The polyps were 5 mm in                            size. These polyps were removed with a cold snare.                            Resection and retrieval were complete.                           Internal  hemorrhoids were found during retroflexion.                           The exam was otherwise without abnormality on                            direct and retroflexion views. Complications:            No immediate complications. Estimated blood loss:                            None. Estimated Blood Loss:     Estimated blood loss: none. Impression:               - The examined portion of the ileum was normal.                           - Two 5 mm polyps in the sigmoid colon and in the                            descending colon, removed with a cold snare.                            Resected and retrieved.                           - Internal hemorrhoids.                           - The examination was otherwise normal on direct                            and retroflexion views. Recommendation:            - Repeat colonoscopy in 5-10 years for surveillance.                           - Patient has a contact number available for                            emergencies. The signs and symptoms of potential                            delayed complications were discussed with the                            patient. Return to normal activities tomorrow.                            Written discharge instructions were provided to the                            patient.                           - Resume previous diet.                           -  Continue present medications.                           - Await pathology results. Docia Chuck. Henrene Pastor, MD 06/06/2016 8:46:56 AM This report has been signed electronically. CC Letter to:             Annie Main A. Forde Dandy, MD

## 2016-06-06 NOTE — Progress Notes (Signed)
Called to room to assist during endoscopic procedure.  Patient ID and intended procedure confirmed with present staff. Received instructions for my participation in the procedure from the performing physician.  

## 2016-06-06 NOTE — Progress Notes (Signed)
To recovery, report to McCoy, RN, VSS 

## 2016-06-06 NOTE — Patient Instructions (Signed)
Discharge instructions given. Handouts on polyps and hemorrhoids. Resume previous medications. YOU HAD AN ENDOSCOPIC PROCEDURE TODAY AT THE [facility]:   Refer to the procedure report that was given to you for any specific questions about what was found during the examination.  If the procedure report does not answer your questions, please call your gastroenterologist to clarify.  If you requested that your care partner not be given the details of your procedure findings, then the procedure report has been included in a sealed envelope for you to review at your convenience later.  YOU SHOULD EXPECT: Some feelings of bloating in the abdomen. Passage of more gas than usual.  Walking can help get rid of the air that was put into your GI tract during the procedure and reduce the bloating. If you had a lower endoscopy (such as a colonoscopy or flexible sigmoidoscopy) you may notice spotting of blood in your stool or on the toilet paper. If you underwent a bowel prep for your procedure, you may not have a normal bowel movement for a few days.  Please Note:  You might notice some irritation and congestion in your nose or some drainage.  This is from the oxygen used during your procedure.  There is no need for concern and it should clear up in a day or so.  SYMPTOMS TO REPORT IMMEDIATELY:   Following lower endoscopy (colonoscopy or flexible sigmoidoscopy):  Excessive amounts of blood in the stool  Significant tenderness or worsening of abdominal pains  Swelling of the abdomen that is new, acute  Fever of 100F or higher   For urgent or emergent issues, a gastroenterologist can be reached at any hour by calling [phone].   DIET: Your first meal following the procedure should be a small meal and then it is ok to progress to your normal diet. Heavy or fried foods are harder to digest and may make you feel nauseous or bloated.  Likewise, meals heavy in dairy and vegetables can increase  bloating.  Drink plenty of fluids but you should avoid alcoholic beverages for 24 hours.  ACTIVITY:  You should plan to take it easy for the rest of today and you should NOT DRIVE or use heavy machinery until tomorrow (because of the sedation medicines used during the test).    FOLLOW UP: Our staff will call the number listed on your records the next business day following your procedure to check on you and address any questions or concerns that you may have regarding the information given to you following your procedure. If we do not reach you, we will leave a message.  However, if you are feeling well and you are not experiencing any problems, there is no need to return our call.  We will assume that you have returned to your regular daily activities without incident.  If any biopsies were taken you will be contacted by phone or by letter within the next 1-3 weeks.  Please call us at [phone] if you have not heard about the biopsies in 3 weeks.    SIGNATURES/CONFIDENTIALITY: You and/or your care partner have signed paperwork which will be entered into your electronic medical record.  These signatures attest to the fact that that the information above on your After Visit Summary has been reviewed and is understood.  Full responsibility of the confidentiality of this discharge information lies with you and/or your care-partner. 

## 2016-06-07 ENCOUNTER — Telehealth: Payer: Self-pay

## 2016-06-07 NOTE — Telephone Encounter (Signed)
  Follow up Call-  Call back number 06/06/2016  Post procedure Call Back phone  # [phone]  Permission to leave phone message Yes     Patient questions:  Do you have a fever, pain , or abdominal swelling? No. Pain Score  0 *  Have you tolerated food without any problems? Yes.    Have you been able to return to your normal activities? Yes.    Do you have any questions about your discharge instructions: Diet   No. Medications  No. Follow up visit  No.  Do you have questions or concerns about your Care? No.  Actions: * If pain score is 4 or above: No action needed, pain <4.

## 2016-06-08 ENCOUNTER — Encounter: Payer: Self-pay | Admitting: Internal Medicine

## 2016-06-09 ENCOUNTER — Other Ambulatory Visit: Payer: Self-pay | Admitting: Internal Medicine

## 2016-08-05 ENCOUNTER — Other Ambulatory Visit: Payer: Self-pay | Admitting: Internal Medicine

## 2016-09-29 ENCOUNTER — Telehealth: Payer: Self-pay | Admitting: Internal Medicine

## 2016-09-29 NOTE — Telephone Encounter (Signed)
Patient Name: Andrea Anthony  DOB: 05/02/1946    Initial Comment Caller states mom bp is elevated, asking for appt   Nurse Assessment  Nurse: Raphael Gibney, RN, Vanita Ingles Date/Time (Eastern Time): 09/29/2016 9:47:50 AM  Confirm and document reason for call. If symptomatic, describe symptoms. You must click the next button to save text entered. ---Caller states her mother's BP is elevated. She is taking her BP medications. BP 157/90 last night. Had headache and neck pain last night but not now. She works out in [employer]. BP 118-120. Has been going up at night for 3 months.  Has the patient traveled out of the country within the last 30 days? ---Not Applicable  Does the patient have any new or worsening symptoms? ---Yes  Will a triage be completed? ---Yes  Related visit to physician within the last 2 weeks? ---No  Does the PT have any chronic conditions? (i.e. diabetes, asthma, etc.) ---Yes  List chronic conditions. ---HTN  Is this a behavioral health or substance abuse call? ---No     Guidelines    Guideline Title Affirmed Question Affirmed Notes  High Blood Pressure [1] BP ? 140/90 AND [2] taking BP medications    Final Disposition User   See PCP within 2 Maudry Diego, RN, Vanita Ingles    Comments  appt scheduled for 10/03/16 at 10 am with Dr. Scarlette Calico.   Disagree/Comply: Comply

## 2016-10-03 ENCOUNTER — Ambulatory Visit (INDEPENDENT_AMBULATORY_CARE_PROVIDER_SITE_OTHER): Payer: Medicare Other | Admitting: Internal Medicine

## 2016-10-03 ENCOUNTER — Encounter: Payer: Self-pay | Admitting: Internal Medicine

## 2016-10-03 ENCOUNTER — Other Ambulatory Visit (INDEPENDENT_AMBULATORY_CARE_PROVIDER_SITE_OTHER): Payer: Medicare Other

## 2016-10-03 VITALS — BP 144/88 | HR 68 | Temp 97.7°F | Resp 16 | Ht 66.0 in | Wt 196.0 lb

## 2016-10-03 DIAGNOSIS — E118 Type 2 diabetes mellitus with unspecified complications: Secondary | ICD-10-CM

## 2016-10-03 DIAGNOSIS — D508 Other iron deficiency anemias: Secondary | ICD-10-CM

## 2016-10-03 DIAGNOSIS — I10 Essential (primary) hypertension: Secondary | ICD-10-CM

## 2016-10-03 DIAGNOSIS — Z2911 Encounter for prophylactic immunotherapy for respiratory syncytial virus (RSV): Secondary | ICD-10-CM

## 2016-10-03 DIAGNOSIS — Z23 Encounter for immunization: Secondary | ICD-10-CM

## 2016-10-03 LAB — URINALYSIS, ROUTINE W REFLEX MICROSCOPIC
BILIRUBIN URINE: NEGATIVE
Hgb urine dipstick: NEGATIVE
KETONES UR: NEGATIVE
LEUKOCYTES UA: NEGATIVE
Nitrite: NEGATIVE
PH: 7 (ref 5.0–8.0)
SPECIFIC GRAVITY, URINE: 1.01 (ref 1.000–1.030)
TOTAL PROTEIN, URINE-UPE24: NEGATIVE
UROBILINOGEN UA: 0.2 (ref 0.0–1.0)
Urine Glucose: NEGATIVE

## 2016-10-03 LAB — CBC WITH DIFFERENTIAL/PLATELET
BASOS PCT: 0.8 % (ref 0.0–3.0)
Basophils Absolute: 0.1 10*3/uL (ref 0.0–0.1)
EOS PCT: 2.9 % (ref 0.0–5.0)
Eosinophils Absolute: 0.2 10*3/uL (ref 0.0–0.7)
HCT: 42.1 % (ref 36.0–46.0)
HEMOGLOBIN: 13.9 g/dL (ref 12.0–15.0)
LYMPHS ABS: 3.7 10*3/uL (ref 0.7–4.0)
Lymphocytes Relative: 43.7 % (ref 12.0–46.0)
MCHC: 33 g/dL (ref 30.0–36.0)
MCV: 85.5 fl (ref 78.0–100.0)
MONO ABS: 0.7 10*3/uL (ref 0.1–1.0)
MONOS PCT: 8.1 % (ref 3.0–12.0)
NEUTROS PCT: 44.5 % (ref 43.0–77.0)
Neutro Abs: 3.8 10*3/uL (ref 1.4–7.7)
Platelets: 229 10*3/uL (ref 150.0–400.0)
RBC: 4.93 Mil/uL (ref 3.87–5.11)
RDW: 13.5 % (ref 11.5–15.5)
WBC: 8.5 10*3/uL (ref 4.0–10.5)

## 2016-10-03 LAB — BASIC METABOLIC PANEL
BUN: 14 mg/dL (ref 6–23)
CO2: 29 mEq/L (ref 19–32)
Calcium: 10.1 mg/dL (ref 8.4–10.5)
Chloride: 103 mEq/L (ref 96–112)
Creatinine, Ser: 0.88 mg/dL (ref 0.40–1.20)
GFR: 81.85 mL/min (ref >60.00)
Glucose, Bld: 89 mg/dL (ref 70–99)
POTASSIUM: 4.3 meq/L (ref 3.5–5.1)
SODIUM: 139 meq/L (ref 135–145)

## 2016-10-03 LAB — TSH: TSH: 0.95 u[IU]/mL (ref 0.35–4.50)

## 2016-10-03 LAB — HEMOGLOBIN A1C: Hgb A1c MFr Bld: 6.4 % (ref 4.6–6.5)

## 2016-10-03 MED ORDER — GLUCOSE BLOOD VI STRP: ORAL_STRIP | 11 refills | Status: DC

## 2016-10-03 MED ORDER — NEBIVOLOL HCL 5 MG PO TABS: 5.0000 mg | ORAL_TABLET | Freq: Every day | ORAL | 3 refills | Status: DC

## 2016-10-03 MED ORDER — ONETOUCH VERIO FLEX SYSTEM W/DEVICE KIT: 1.0000 | PACK | Freq: Three times a day (TID) | 0 refills | Status: DC

## 2016-10-03 NOTE — Progress Notes (Signed)
Subjective:  Patient ID: Andrea Anthony, female    DOB: 03/18/1946  Age: 70 y.o. MRN: [id number]  CC: Hypertension (Pt stated increase B/P especially at nigh) and Diabetes  NEW TO ME- [place] female whose daughter translates for her  HPI [name] presents for concerns about elevated blood pressure despite taking a calcium channel blocker and an ARB. She complains that her blood pressure has not been well controlled for the last month. She tells me that late in the day her systolic spikes up to 093 and she has headaches when this happens. She denies blurred vision, chest pain, shortness of breath, palpitations, syncope, or edema. She also thinks her blood sugars have been well controlled. Has had a couple of episodes of feeling like there is a film over her eyes but she has had no recent episodes of photophobia or blurred vision. She needs a referral to an ophthalmologist for an annual exam.  Outpatient Medications Prior to Visit  Medication Sig Dispense Refill  . amLODipine (NORVASC) 5 MG tablet TAKE 1 TABLET (5 MG TOTAL) BY MOUTH DAILY. 90 tablet 0  . aspirin 81 MG tablet Take 81 mg by mouth daily.    Marland Kitchen glipiZIDE (GLIPIZIDE XL) 2.5 MG 24 hr tablet Take 1 tablet (2.5 mg total) by mouth daily with breakfast. 90 tablet 3  . irbesartan (AVAPRO) 150 MG tablet TAKE 1 TABLET (150 MG TOTAL) BY MOUTH DAILY. 30 tablet 10  . meloxicam (MOBIC) 15 MG tablet TAKE 1 TABLET (15 MG TOTAL) BY MOUTH DAILY AS NEEDED FOR PAIN. 30 tablet 4  . metFORMIN (GLUCOPHAGE) 500 MG tablet TAKE 2 TABLETS (1,000 MG TOTAL) BY MOUTH 2 (TWO) TIMES DAILY WITH A MEAL. 360 tablet 2  . pravastatin (PRAVACHOL) 40 MG tablet TAKE 1 TABLET (40 MG TOTAL) BY MOUTH DAILY. 30 tablet 10   No facility-administered medications prior to visit.     ROS Review of Systems  Constitutional: Negative.  Negative for diaphoresis, fatigue and unexpected weight change.  Eyes: Positive for visual disturbance. Negative for photophobia.    Respiratory: Negative.  Negative for cough, choking, chest tightness, shortness of breath and stridor.   Cardiovascular: Negative.  Negative for chest pain, palpitations and leg swelling.  Gastrointestinal: Negative.  Negative for abdominal pain, constipation, diarrhea, nausea and vomiting.  Endocrine: Negative for polydipsia, polyphagia and polyuria.  Genitourinary: Negative.  Negative for difficulty urinating.  Musculoskeletal: Negative.  Negative for arthralgias, back pain, myalgias and neck pain.  Skin: Negative.  Negative for rash.  Allergic/Immunologic: Negative.   Neurological: Positive for headaches. Negative for dizziness, tremors, seizures, syncope, facial asymmetry, speech difficulty, weakness, light-headedness and numbness.  Hematological: Negative.  Negative for adenopathy. Does not bruise/bleed easily.  Psychiatric/Behavioral: Negative.     Objective:  BP (!) 144/88 (BP Location: Left Arm, Patient Position: Sitting, Cuff Size: Large)   Pulse 68   Temp 97.7 F (36.5 C)   Resp 16   Ht _0  (1.676 m)   Wt 196 lb (88.9 kg)   SpO2 97%   BMI 31.64 kg/m   BP Readings from Last 3 Encounters:  10/03/16 (!) 144/88  06/06/16 (!) 144/83  05/09/16 130/78    Wt Readings from Last 3 Encounters:  10/03/16 196 lb (88.9 kg)  06/06/16 195 lb (88.5 kg)  05/23/16 195 lb 9.6 oz (88.7 kg)    Physical Exam  Constitutional: She is oriented to person, place, and time. No distress.  HENT:  Mouth/Throat: Oropharynx is clear and moist. No oropharyngeal  exudate.  Eyes: Conjunctivae are normal. Right eye exhibits no discharge. Left eye exhibits no discharge. No scleral icterus.  Neck: Normal range of motion. Neck supple. No JVD present. No tracheal deviation present. No thyromegaly present.  Cardiovascular: Normal rate, regular rhythm, normal heart sounds and intact distal pulses.  Exam reveals no gallop and no friction rub.   No murmur heard. Pulmonary/Chest: Effort normal and breath  sounds normal. No stridor. No respiratory distress. She has no wheezes. She exhibits no tenderness.  Abdominal: Soft. Bowel sounds are normal. She exhibits no distension and no mass. There is no tenderness. There is no rebound and no guarding.  Musculoskeletal: Normal range of motion. She exhibits no edema, tenderness or deformity.  Lymphadenopathy:    She has no cervical adenopathy.  Neurological: She is oriented to person, place, and time.  Skin: Skin is warm and dry. No rash noted. She is not diaphoretic. No erythema. No pallor.  Psychiatric: She has a normal mood and affect. Her behavior is normal. Judgment and thought content normal.  Vitals reviewed.   Lab Results  Component Value Date   WBC 8.5 10/03/2016   HGB 13.9 10/03/2016   HCT 42.1 10/03/2016   PLT 229.0 10/03/2016   GLUCOSE 89 10/03/2016   CHOL 131 05/09/2016   TRIG 80.0 05/09/2016   HDL 46.60 05/09/2016   LDLCALC 68 05/09/2016   ALT 24 05/09/2016   AST 20 05/09/2016   NA 139 10/03/2016   K 4.3 10/03/2016   CL 103 10/03/2016   CREATININE 0.88 10/03/2016   BUN 14 10/03/2016   CO2 29 10/03/2016   TSH 0.95 10/03/2016   HGBA1C 6.4 10/03/2016   MICROALBUR <0.7 05/09/2016    No results found.  Assessment & Plan:   Bernita was seen today for hypertension and diabetes.  Diagnoses and all orders for this visit:  Need for prophylactic vaccination and inoculation against influenza -     Flu vaccine HIGH DOSE PF  Essential hypertension- Her blood pressure is not well controlled and she is symptomatic with an occasional headache so I've asked her to add nebivolol to her current antihypertensive regimen, her electrolytes and renal function are stable. -     CBC with Differential/Platelet; Future -     Basic metabolic panel; Future -     TSH; Future -     Urinalysis, Routine w reflex microscopic (not at [facility]); Future -     nebivolol (BYSTOLIC) 5 MG tablet; Take 1 tablet (5 mg total) by mouth daily.  Type 2  diabetes mellitus with complication, without long-term current use of insulin ([place])- her blood sugars are adequately well controlled, she will continue metformin and glipizide at the current dose, have asked her to have her annual eye exam done. -     Basic metabolic panel; Future -     Hemoglobin A1c; Future -     Ambulatory referral to Ophthalmology -     Blood Glucose Monitoring Suppl (Gabbs) w/Device KIT; 1 Act by Does not apply route 3 (three) times daily with meals. -     glucose blood (ONETOUCH VERIO) test strip; Use TID  Other iron deficiency anemia- improvement noted -     CBC with Differential/Platelet; Future  Need for prophylactic vaccination and inoculation against single disease  Other orders -     Varicella-zoster vaccine subcutaneous   I am having Ms. Quackenbush start on nebivolol, ONETOUCH VERIO FLEX SYSTEM, and glucose blood. I am also having her  maintain her aspirin, glipiZIDE, pravastatin, irbesartan, meloxicam, amLODipine, and metFORMIN.  Meds ordered this encounter  Medications  . nebivolol (BYSTOLIC) 5 MG tablet    Sig: Take 1 tablet (5 mg total) by mouth daily.    Dispense:  30 tablet    Refill:  3  . Blood Glucose Monitoring Suppl (ONETOUCH VERIO FLEX SYSTEM) w/Device KIT    Sig: 1 Act by Does not apply route 3 (three) times daily with meals.    Dispense:  2 kit    Refill:  0  . glucose blood (ONETOUCH VERIO) test strip    Sig: Use TID    Dispense:  100 each    Refill:  11     Follow-up: Return in about 4 weeks (around 10/31/2016).  Scarlette Calico, MD

## 2016-10-03 NOTE — Progress Notes (Signed)
Pre visit review using our clinic review tool, if applicable. No additional management support is needed unless otherwise documented below in the visit note. 

## 2016-10-03 NOTE — Patient Instructions (Signed)
Hypertension Hypertension, commonly called high blood pressure, is when the force of blood pumping through your arteries is too strong. Your arteries are the blood vessels that carry blood from your heart throughout your body. A blood pressure reading consists of a higher number over a lower number, such as 110/72. The higher number (systolic) is the pressure inside your arteries when your heart pumps. The lower number (diastolic) is the pressure inside your arteries when your heart relaxes. Ideally you want your blood pressure below 120/80. Hypertension forces your heart to work harder to pump blood. Your arteries may become narrow or stiff. Having untreated or uncontrolled hypertension can cause heart attack, stroke, kidney disease, and other problems. RISK FACTORS Some risk factors for high blood pressure are controllable. Others are not.  Risk factors you cannot control include:   Race. You may be at higher risk if you are African American.  Age. Risk increases with age.  Gender. Men are at higher risk than women before age 45 years. After age 65, women are at higher risk than men. Risk factors you can control include:  Not getting enough exercise or physical activity.  Being overweight.  Getting too much fat, sugar, calories, or salt in your diet.  Drinking too much alcohol. SIGNS AND SYMPTOMS Hypertension does not usually cause signs or symptoms. Extremely high blood pressure (hypertensive crisis) may cause headache, anxiety, shortness of breath, and nosebleed. DIAGNOSIS To check if you have hypertension, your health care provider will measure your blood pressure while you are seated, with your arm held at the level of your heart. It should be measured at least twice using the same arm. Certain conditions can cause a difference in blood pressure between your right and left arms. A blood pressure reading that is higher than normal on one occasion does not mean that you need treatment. If  it is not clear whether you have high blood pressure, you may be asked to return on a different day to have your blood pressure checked again. Or, you may be asked to monitor your blood pressure at home for 1 or more weeks. TREATMENT Treating high blood pressure includes making lifestyle changes and possibly taking medicine. Living a healthy lifestyle can help lower high blood pressure. You may need to change some of your habits. Lifestyle changes may include:  Following the DASH diet. This diet is high in fruits, vegetables, and whole grains. It is low in salt, red meat, and added sugars.  Keep your sodium intake below 2,300 mg per day.  Getting at least 30-45 minutes of aerobic exercise at least 4 times per week.  Losing weight if necessary.  Not smoking.  Limiting alcoholic beverages.  Learning ways to reduce stress. Your health care provider may prescribe medicine if lifestyle changes are not enough to get your blood pressure under control, and if one of the following is true:  You are 18-59 years of age and your systolic blood pressure is above 140.  You are 60 years of age or older, and your systolic blood pressure is above 150.  Your diastolic blood pressure is above 90.  You have diabetes, and your systolic blood pressure is over 140 or your diastolic blood pressure is over 90.  You have kidney disease and your blood pressure is above 140/90.  You have heart disease and your blood pressure is above 140/90. Your personal target blood pressure may vary depending on your medical conditions, your age, and other factors. HOME CARE INSTRUCTIONS    Have your blood pressure rechecked as directed by your health care provider.   Take medicines only as directed by your health care provider. Follow the directions carefully. Blood pressure medicines must be taken as prescribed. The medicine does not work as well when you skip doses. Skipping doses also puts you at risk for  problems.  Do not smoke.   Monitor your blood pressure at home as directed by your health care provider. SEEK MEDICAL CARE IF:   You think you are having a reaction to medicines taken.  You have recurrent headaches or feel dizzy.  You have swelling in your ankles.  You have trouble with your vision. SEEK IMMEDIATE MEDICAL CARE IF:  You develop a severe headache or confusion.  You have unusual weakness, numbness, or feel faint.  You have severe chest or abdominal pain.  You vomit repeatedly.  You have trouble breathing. MAKE SURE YOU:   Understand these instructions.  Will watch your condition.  Will get help right away if you are not doing well or get worse.   This information is not intended to replace advice given to you by your health care provider. Make sure you discuss any questions you have with your health care provider.   Document Released: 11/27/2005 Document Revised: 04/13/2015 Document Reviewed: 09/19/2013 Elsevier Interactive Patient Education 2016 [employer]  

## 2016-11-07 ENCOUNTER — Ambulatory Visit (INDEPENDENT_AMBULATORY_CARE_PROVIDER_SITE_OTHER): Payer: Medicare Other | Admitting: Internal Medicine

## 2016-11-07 ENCOUNTER — Encounter: Payer: Self-pay | Admitting: Internal Medicine

## 2016-11-07 VITALS — BP 140/80 | HR 86 | Resp 20 | Wt 197.0 lb

## 2016-11-07 DIAGNOSIS — E118 Type 2 diabetes mellitus with unspecified complications: Secondary | ICD-10-CM | POA: Diagnosis not present

## 2016-11-07 DIAGNOSIS — Z0001 Encounter for general adult medical examination with abnormal findings: Secondary | ICD-10-CM | POA: Diagnosis not present

## 2016-11-07 DIAGNOSIS — E785 Hyperlipidemia, unspecified: Secondary | ICD-10-CM | POA: Diagnosis not present

## 2016-11-07 DIAGNOSIS — I10 Essential (primary) hypertension: Secondary | ICD-10-CM

## 2016-11-07 MED ORDER — IRBESARTAN 300 MG PO TABS: 300.0000 mg | ORAL_TABLET | Freq: Every day | ORAL | 3 refills | Status: DC

## 2016-11-07 NOTE — Patient Instructions (Addendum)
Ok to stop the avapro 150 mg (irbesartan)  Please take all new medication as prescribed  - the avapro at 300mg   Please continue to monitor your Blood Pressure at home, with the goal being < 130/80  Please call for your yearly eye exam - [facility]  Please continue all other medications as before, and refills have been done if requested.  Please have the pharmacy call with any other refills you may need.  Please continue your efforts at being more active, low cholesterol diet, diabetic and weight control.  Please keep your appointments with your specialists as you may have planned

## 2016-11-07 NOTE — Progress Notes (Signed)
Subjective:    Patient ID: Andrea Anthony, female    DOB: 1946/03/11, 70 y.o.   MRN: [id number]  HPI  Here to f/u; overall doing ok,  Pt denies chest pain, increasing sob or doe, wheezing, orthopnea, PND, increased LE swelling, palpitations, dizziness or syncope.  Pt denies new neurological symptoms such as new headache, or facial or extremity weakness or numbness.  Pt denies polydipsia, polyuria, or low sugar episode.   Pt denies new neurological symptoms such as new headache, or facial or extremity weakness or numbness.   Pt states overall good compliance with meds, mostly trying to follow appropriate diet, with wt overall stable,  but little exercise however.  Finished samples of bystolic 5 but did not continue with rx. Tolerated well but not sure how well it worked. No other history changes BP Readings from Last 3 Encounters:  11/07/16 140/80  10/03/16 (!) 144/88  06/06/16 (!) 144/83   Wt Readings from Last 3 Encounters:  11/07/16 197 lb (89.4 kg)  10/03/16 196 lb (88.9 kg)  06/06/16 195 lb (88.5 kg)   Past Medical History:  Diagnosis Date  . ALLERGIC RHINITIS 01/07/2008   Qualifier: Diagnosis of  By: Jenny Reichmann MD, Shelbie Hutching DEFICIENCY 01/07/2008   Qualifier: Diagnosis of  By: Jenny Reichmann MD, Hunt Oris   . DIABETES MELLITUS, TYPE II 06/24/2007   Qualifier: Diagnosis of  By: Jenny Reichmann MD, Hunt Oris   . DIVERTICULOSIS, COLON 01/07/2008   Qualifier: Diagnosis of  By: Jenny Reichmann MD, Hunt Oris   . Empty sella ([place]) 03/17/2013  . GERD 05/24/2009   Qualifier: Diagnosis of  By: Jenny Reichmann MD, Hunt Oris   . HYPERLIPIDEMIA 06/01/2008   Qualifier: Diagnosis of  By: Jenny Reichmann MD, Hunt Oris   . HYPERTENSION 06/24/2007   Qualifier: Diagnosis of  By: Jenny Reichmann MD, Hunt Oris   . Lumbar disc disease 10/08/2012   l4-5 per CT 2008  . 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 PULMONARY NODULE, RIGHT LOWER LOBE 01/09/2008   Qualifier: Diagnosis of  By: Jenny Reichmann MD, Hunt Oris   . Umbilical hernia 57/26/2035   Fat containing on CT 2008    Past Surgical History:  Procedure Laterality Date  . NO PAST SURGERIES    . UPPER GASTROINTESTINAL ENDOSCOPY      reports that she has never smoked. She has never used smokeless tobacco. She reports that she does not drink alcohol or use drugs. family history includes Hypertension in her father. Allergies  Allergen Reactions  . Ace Inhibitors     REACTION: cough  . Sulfonamide Derivatives     Not sure of reaction   Current Outpatient Prescriptions on File Prior to Visit  Medication Sig Dispense Refill  . amLODipine (NORVASC) 5 MG tablet TAKE 1 TABLET (5 MG TOTAL) BY MOUTH DAILY. 90 tablet 0  . aspirin 81 MG tablet Take 81 mg by mouth daily.    . Blood Glucose Monitoring Suppl (ONETOUCH VERIO FLEX SYSTEM) w/Device KIT 1 Act by Does not apply route 3 (three) times daily with meals. 2 kit 0  . glipiZIDE (GLIPIZIDE XL) 2.5 MG 24 hr tablet Take 1 tablet (2.5 mg total) by mouth daily with breakfast. 90 tablet 3  . glucose blood (ONETOUCH VERIO) test strip Use TID 100 each 11  . meloxicam (MOBIC) 15 MG tablet TAKE 1 TABLET (15 MG TOTAL) BY MOUTH DAILY AS NEEDED FOR PAIN. 30 tablet 4  . metFORMIN (GLUCOPHAGE) 500 MG tablet TAKE 2 TABLETS (  1,000 MG TOTAL) BY MOUTH 2 (TWO) TIMES DAILY WITH A MEAL. 360 tablet 2  . nebivolol (BYSTOLIC) 5 MG tablet Take 1 tablet (5 mg total) by mouth daily. 30 tablet 3  . pravastatin (PRAVACHOL) 40 MG tablet TAKE 1 TABLET (40 MG TOTAL) BY MOUTH DAILY. 30 tablet 10   No current facility-administered medications on file prior to visit.    Review of Systems  Constitutional: Negative for unusual diaphoresis or night sweats HENT: Negative for ear swelling or discharge Eyes: Negative for worsening visual haziness  Respiratory: Negative for choking and stridor.   Gastrointestinal: Negative for distension or worsening eructation Genitourinary: Negative for retention or change in urine volume.  Musculoskeletal: Negative for other MSK pain or swelling Skin: Negative  for color change and worsening wound Neurological: Negative for tremors and numbness other than noted  Psychiatric/Behavioral: Negative for decreased concentration or agitation other than above   All other system neg per pt    Objective:   Physical Exam BP 140/80   Pulse 86   Resp 20   Wt 197 lb (89.4 kg)   SpO2 97%   BMI 31.80 kg/m  VS noted,  Constitutional: Pt appears in no apparent distress HENT: Head: NCAT.  Right Ear: External ear normal.  Left Ear: External ear normal.  Eyes: . Pupils are equal, round, and reactive to light. Conjunctivae and EOM are normal Neck: Normal range of motion. Neck supple.  Cardiovascular: Normal rate and regular rhythm.   Pulmonary/Chest: Effort normal and breath sounds without rales or wheezing.  Neurological: Pt is alert. Not confused , motor grossly intact Skin: Skin is warm. No rash, no LE edema Psychiatric: Pt behavior is normal. No agitation.  No other new exam findings    Assessment & Plan:

## 2016-11-07 NOTE — Progress Notes (Signed)
Pre visit review using our clinic review tool, if applicable. No additional management support is needed unless otherwise documented below in the visit note. 

## 2016-11-09 ENCOUNTER — Ambulatory Visit: Payer: Medicare Other | Admitting: Internal Medicine

## 2016-11-13 NOTE — Assessment & Plan Note (Signed)
stable overall by history and exam, recent data reviewed with pt, and pt to continue medical treatment as before,  to f/u any worsening symptoms or concerns Lab Results  Component Value Date   HGBA1C 6.4 10/03/2016

## 2016-11-13 NOTE — Assessment & Plan Note (Signed)
stable overall by history and exam, recent data reviewed with pt, and pt to continue medical treatment as before,  to f/u any worsening symptoms or concerns Lab Results  Component Value Date   LDLCALC 68 05/09/2016

## 2016-11-13 NOTE — Assessment & Plan Note (Signed)
stable overall by history and exam, recent data reviewed with pt, and pt to continue medical treatment as before,  to f/u any worsening symptoms or concerns BP Readings from Last 3 Encounters:  11/07/16 140/80  10/03/16 (!) 144/88  06/06/16 (!) 144/83

## 2016-12-06 ENCOUNTER — Other Ambulatory Visit: Payer: Self-pay | Admitting: Internal Medicine

## 2016-12-07 ENCOUNTER — Other Ambulatory Visit: Payer: Self-pay | Admitting: *Deleted

## 2016-12-07 DIAGNOSIS — E118 Type 2 diabetes mellitus with unspecified complications: Secondary | ICD-10-CM

## 2016-12-07 MED ORDER — ONETOUCH DELICA LANCETS 33G MISC: 11 refills | Status: DC

## 2016-12-07 MED ORDER — GLUCOSE BLOOD VI STRP: ORAL_STRIP | 11 refills | Status: DC

## 2017-01-30 ENCOUNTER — Encounter: Payer: Self-pay | Admitting: Internal Medicine

## 2017-01-30 ENCOUNTER — Ambulatory Visit (INDEPENDENT_AMBULATORY_CARE_PROVIDER_SITE_OTHER): Payer: Medicare Other | Admitting: Internal Medicine

## 2017-01-30 ENCOUNTER — Other Ambulatory Visit: Payer: Self-pay | Admitting: Internal Medicine

## 2017-01-30 VITALS — BP 142/88 | HR 86 | Temp 97.0°F | Ht 66.0 in | Wt 197.0 lb

## 2017-01-30 DIAGNOSIS — E118 Type 2 diabetes mellitus with unspecified complications: Secondary | ICD-10-CM | POA: Diagnosis not present

## 2017-01-30 DIAGNOSIS — I10 Essential (primary) hypertension: Secondary | ICD-10-CM | POA: Diagnosis not present

## 2017-01-30 DIAGNOSIS — M5416 Radiculopathy, lumbar region: Secondary | ICD-10-CM | POA: Diagnosis not present

## 2017-01-30 MED ORDER — NEBIVOLOL HCL 5 MG PO TABS: 5.0000 mg | ORAL_TABLET | Freq: Every day | ORAL | 3 refills | Status: DC

## 2017-01-30 MED ORDER — TRAMADOL HCL 50 MG PO TABS: 50.0000 mg | ORAL_TABLET | Freq: Three times a day (TID) | ORAL | 0 refills | Status: DC | PRN

## 2017-01-30 MED ORDER — MELOXICAM 15 MG PO TABS: ORAL_TABLET | ORAL | 4 refills | Status: DC

## 2017-01-30 NOTE — Patient Instructions (Signed)
Please take all new medication as prescribed - the tramadol for pain as needed  Please continue all other medications as before, including the nebivolol, and the meloxicam  Please have the pharmacy call with any other refills you may need.  Please continue your efforts at being more active, low cholesterol diet, and weight control..  Please keep your appointments with your specialists as you may have planned  You will be contacted regarding the referral for: MRI for lower back, and Orthopedic referral

## 2017-01-30 NOTE — Progress Notes (Signed)
Subjective:    Patient ID: Andrea Anthony, female    DOB: 12/12/1945, 71 y.o.   MRN: [id number]  HPI  Here to f/u; overall doing ok,  Pt denies chest pain, increasing sob or doe, wheezing, orthopnea, PND, increased LE swelling, palpitations, dizziness or syncope.  Pt denies new neurological symptoms such as new headache, or facial or extremity weakness or numbness.  Pt denies polydipsia, polyuria, or low sugar episode.   Pt denies new neurological symptoms such as new headache, or facial or extremity weakness or numbness.   Pt states overall good compliance with meds, mostly trying to follow appropriate diet, with wt overall stable Wt Readings from Last 3 Encounters:  01/30/17 197 lb (89.4 kg)  11/07/16 197 lb (89.4 kg)  10/03/16 196 lb (88.9 kg)  Left lower back pain and weakness x 3 wks, without numbness, radiation to the distal left foot often, can be mild or severe bugt most often moderate, worse to lie on left side   .Pt denies bowel or bladder change, fever, wt loss, or falls. Out of nsaid and this  Denies urinary symptoms such as dysuria, frequency, urgency, flank pain, hematuria or n/v, fever, chills.  Denies worsening reflux, abd pain, dysphagia, n/v, bowel change or blood. Pt only taking 2 BP pills, for some reason ran out bystolic and has not been taking Past Medical History:  Diagnosis Date  . ALLERGIC RHINITIS 01/07/2008   Qualifier: Diagnosis of  By: Jenny Reichmann MD, Shelbie Hutching DEFICIENCY 01/07/2008   Qualifier: Diagnosis of  By: Jenny Reichmann MD, Hunt Oris   . DIABETES MELLITUS, TYPE II 06/24/2007   Qualifier: Diagnosis of  By: Jenny Reichmann MD, Hunt Oris   . DIVERTICULOSIS, COLON 01/07/2008   Qualifier: Diagnosis of  By: Jenny Reichmann MD, Hunt Oris   . Empty sella ([place]) 03/17/2013  . GERD 05/24/2009   Qualifier: Diagnosis of  By: Jenny Reichmann MD, Hunt Oris   . HYPERLIPIDEMIA 06/01/2008   Qualifier: Diagnosis of  By: Jenny Reichmann MD, Hunt Oris   . HYPERTENSION 06/24/2007   Qualifier: Diagnosis of  By: Jenny Reichmann MD, Hunt Oris   .  Lumbar disc disease 10/08/2012   l4-5 per CT 2008  . MALT lymphoma ([place]) 06/08/2012   Gastric, 2001  . PULMONARY NODULE, RIGHT LOWER LOBE 01/09/2008   Qualifier: Diagnosis of  By: Jenny Reichmann MD, Hunt Oris   . Umbilical hernia 56/21/3086   Fat containing on CT 2008   Past Surgical History:  Procedure Laterality Date  . NO PAST SURGERIES    . UPPER GASTROINTESTINAL ENDOSCOPY      reports that she has never smoked. She has never used smokeless tobacco. She reports that she does not drink alcohol or use drugs. family history includes Hypertension in her father. Allergies  Allergen Reactions  . Ace Inhibitors     REACTION: cough  . Sulfonamide Derivatives     Not sure of reaction   Current Outpatient Prescriptions on File Prior to Visit  Medication Sig Dispense Refill  . amLODipine (NORVASC) 5 MG tablet TAKE 1 TABLET (5 MG TOTAL) BY MOUTH DAILY. 90 tablet 0  . aspirin 81 MG tablet Take 81 mg by mouth daily.    . Blood Glucose Monitoring Suppl (ONETOUCH VERIO FLEX SYSTEM) w/Device KIT 1 Act by Does not apply route 3 (three) times daily with meals. 2 kit 0  . glipiZIDE (GLIPIZIDE XL) 2.5 MG 24 hr tablet Take 1 tablet (2.5 mg total) by mouth daily with breakfast. 90 tablet 3  .  glucose blood (ONETOUCH VERIO) test strip Use to check blood sugars three times a day Dx E11.9 100 each 11  . irbesartan (AVAPRO) 300 MG tablet Take 1 tablet (300 mg total) by mouth daily. 90 tablet 3  . metFORMIN (GLUCOPHAGE) 500 MG tablet TAKE 2 TABLETS (1,000 MG TOTAL) BY MOUTH 2 (TWO) TIMES DAILY WITH A MEAL. 360 tablet 2  . ONETOUCH DELICA LANCETS 62H MISC Use to help check blood sugars three times a day Dx E11.9 100 each 11   No current facility-administered medications on file prior to visit.    Review of Systems  Constitutional: Negative for unusual diaphoresis or night sweats HENT: Negative for ear swelling or discharge Eyes: Negative for worsening visual haziness  Respiratory: Negative for choking and stridor.    Gastrointestinal: Negative for distension or worsening eructation Genitourinary: Negative for retention or change in urine volume.  Musculoskeletal: Negative for other MSK pain or swelling Skin: Negative for color change and worsening wound Neurological: Negative for tremors and numbness other than noted  Psychiatric/Behavioral: Negative for decreased concentration or agitation other than above   All other system neg per pt    Objective:   Physical Exam BP (!) 142/88   Pulse 86   Temp 97 F (36.1 C)   Ht '5\' 6"'  (1.676 m)   Wt 197 lb (89.4 kg)   SpO2 96%   BMI 31.80 kg/m  VS noted,  Constitutional: Pt appears in no apparent distress HENT: Head: NCAT.  Right Ear: External ear normal.  Left Ear: External ear normal.  Eyes: . Pupils are equal, round, and reactive to light. Conjunctivae and EOM are normal Neck: Normal range of motion. Neck supple.  Cardiovascular: Normal rate and regular rhythm.   Pulmonary/Chest: Effort normal and breath sounds without rales or wheezing.  Abd:  Soft, NT, ND, + BS Neurological: Pt is alert. Not confused , motor grossly intact Skin: Skin is warm. No rash, no LE edema Psychiatric: Pt behavior is normal. No agitation.  Spine nontender in midline + tender left upper buttock + LLE 4+/5     Assessment & Plan:

## 2017-02-04 NOTE — Assessment & Plan Note (Signed)
New onset, for MRI LS Spine, orthopedic referral,  Pain control, to f/u any worsening symptoms or concerns

## 2017-02-04 NOTE — Assessment & Plan Note (Signed)
Mild elevated, to cont current meds including restart bystolic,  to f/u any worsening symptoms or concerns

## 2017-02-04 NOTE — Assessment & Plan Note (Signed)
stable overall by history and exam, recent data reviewed with pt, and pt to continue medical treatment as before,  to f/u any worsening symptoms or concerns Lab Results  Component Value Date   HGBA1C 6.4 10/03/2016

## 2017-02-13 ENCOUNTER — Encounter: Payer: Self-pay | Admitting: Internal Medicine

## 2017-03-03 ENCOUNTER — Other Ambulatory Visit: Payer: Self-pay | Admitting: Internal Medicine

## 2017-03-05 ENCOUNTER — Ambulatory Visit
Admission: RE | Admit: 2017-03-05 | Discharge: 2017-03-05 | Disposition: A | Discharge status: Home / Self Care | Payer: Medicare Other | Source: Ambulatory Visit | Attending: Internal Medicine | Admitting: Internal Medicine

## 2017-03-05 DIAGNOSIS — M48061 Spinal stenosis, lumbar region without neurogenic claudication: Secondary | ICD-10-CM | POA: Diagnosis not present

## 2017-03-05 DIAGNOSIS — M5416 Radiculopathy, lumbar region: Secondary | ICD-10-CM

## 2017-03-06 ENCOUNTER — Encounter: Payer: Self-pay | Admitting: Internal Medicine

## 2017-03-14 ENCOUNTER — Telehealth: Payer: Self-pay | Admitting: Internal Medicine

## 2017-03-14 NOTE — Telephone Encounter (Signed)
Patient is requesting call back.  States has been prescribed three medications for BP:  Irbesartan, bystolic and amlodpine.  States bystolic was just added.  Would like a call back to explain why the change in medication.

## 2017-03-14 NOTE — Telephone Encounter (Signed)
This was re-started at last visit due to elevated blood pressure, so should be ok to take

## 2017-03-15 NOTE — Telephone Encounter (Signed)
So is the patient suppose to be taking 3 medications for BP?

## 2017-03-15 NOTE — Telephone Encounter (Signed)
Yes, please as this is apparently needed to keep the bp overall < 140/90

## 2017-03-16 NOTE — Telephone Encounter (Signed)
I spoke to pt daughter regarding BP medications and she expressed understanding. She will speak to the patient and explain to her why the change in medication and why she needs to take all 3 BP meds.

## 2017-03-21 ENCOUNTER — Encounter (INDEPENDENT_AMBULATORY_CARE_PROVIDER_SITE_OTHER): Payer: Self-pay | Admitting: Orthopaedic Surgery

## 2017-03-21 ENCOUNTER — Ambulatory Visit (INDEPENDENT_AMBULATORY_CARE_PROVIDER_SITE_OTHER): Payer: Self-pay | Admitting: Orthopaedic Surgery

## 2017-03-21 ENCOUNTER — Ambulatory Visit (INDEPENDENT_AMBULATORY_CARE_PROVIDER_SITE_OTHER): Payer: Medicare Other | Admitting: Orthopaedic Surgery

## 2017-03-21 ENCOUNTER — Ambulatory Visit (INDEPENDENT_AMBULATORY_CARE_PROVIDER_SITE_OTHER): Payer: Medicare Other

## 2017-03-21 VITALS — BP 150/91 | HR 56 | Ht 66.0 in | Wt 180.0 lb

## 2017-03-21 DIAGNOSIS — M5442 Lumbago with sciatica, left side: Secondary | ICD-10-CM

## 2017-03-21 DIAGNOSIS — M5441 Lumbago with sciatica, right side: Secondary | ICD-10-CM

## 2017-03-21 DIAGNOSIS — M48062 Spinal stenosis, lumbar region with neurogenic claudication: Secondary | ICD-10-CM

## 2017-03-21 NOTE — Progress Notes (Signed)
Office Visit Note   Patient: Andrea Anthony           Date of Birth: [birth year]           MRN: [id number] Visit Date: 03/21/2017              Requested by: Biagio Borg, MD [place], [place] [address] PCP: Cathlean Cower, MD   Assessment & Plan: Visit Diagnoses:  1. Acute bilateral low back pain with bilateral sciatica   2. Spinal stenosis of lumbar region with neurogenic claudication     Plan: Patient stable on flexion-extension although she has 2 mm of anterolisthesis at L4-5 with intact pars. I discussed through her daughter who is translating to some degree that she can continue with the treadmill working out at the gym and says she does not check her back in a month. She has the moderate to severe stenosis at the L4-5 level. She may require a surgical intervention if her symptoms progress. Although she has a spondylolisthesis it is stable and she would be a candidate for a simple decompression with overnight stay. Hopefully she'll get better if she continues activities in the gym and recheck in 6 weeks. Follow-Up Instructions: Return in about 6 weeks (around 05/02/2017).   Orders:  Orders Placed This Encounter  Procedures  . XR Lumbar Spine 2-3 Views   No orders of the defined types were placed in this encounter.     Procedures: No procedures performed   Clinical Data: No additional findings.   Subjective: Chief Complaint  Patient presents with  . Lower Back - Pain    HPI patient been having low back pain particularly with movement. She has more pain on the left side left buttocks down her leg. She has increased pain when she stands for long period time or walk. She is able to do her normal standing and walking at home. Her pain tends to wax and wane. It's been more severe for the last 2 months. She's taken some tramadol but not a lot. She denies any known injury. She speaks creel and is here with her daughter. She understands [place] well. She  denies fever chills. No past history of lumbar surgeries.  Review of Systems  Constitutional: Negative for chills and diaphoresis.  HENT: Negative for ear discharge, ear pain and nosebleeds.   Eyes: Negative for discharge and visual disturbance.  Respiratory: Negative for cough, choking and shortness of breath.   Cardiovascular: Negative for chest pain and palpitations.  Gastrointestinal: Negative for abdominal distention and abdominal pain.  Endocrine: Negative for cold intolerance and heat intolerance.       Type 2 diabetes on oral medication.  Genitourinary: Negative for flank pain and hematuria.  Musculoskeletal: Positive for back pain and gait problem.  Skin: Negative for rash and wound.  Neurological: Negative for seizures and speech difficulty.  Hematological: Negative for adenopathy. Does not bruise/bleed easily.  Psychiatric/Behavioral: Negative for agitation and suicidal ideas.   Positive for hypertension and diabetes, type II. History of lymphoma, cervical radiculopathy  Objective: Vital Signs: BP (!) 150/91   Pulse (!) 56   Ht 5\' 6"  (1.676 m)   Wt 180 lb (81.6 kg)   BMI 29.05 kg/m   Physical Exam  Constitutional: She is oriented to person, place, and time. She appears well-developed.  HENT:  Head: Normocephalic.  Right Ear: External ear normal.  Left Ear: External ear normal.  Eyes: Pupils are equal, round, and reactive to  light.  Neck: No tracheal deviation present. No thyromegaly present.  Cardiovascular: Normal rate.   Pulmonary/Chest: Effort normal.  Abdominal: Soft.  Musculoskeletal:  Patient has no pain with hip range of motion. Needs refill extension. Anterior tib EHL is intact no calf tenderness. No pitting edema. Mild sciatic notch tenderness on the left more than right side. Lumbar skin is normal. Pelvis is level. Needs refill extension good quad strength abductors are strong. Sensory testing the foot is intact.  Neurological: She is alert and oriented  to person, place, and time.  Skin: Skin is warm and dry.  Psychiatric: She has a normal mood and affect. Her behavior is normal.    Ortho Exam  Specialty Comments:  No specialty comments available.  Imaging: Xr Lumbar Spine 2-3 Views  Result Date: 03/21/2017 AP x-ray lumbar spine and lateral flexion-extension x-rays were obtained and reviewed. This shows the anterolisthesis 2 mm at L4-5 which is stable on flexion-extension views. She has some narrowing at 79 with spurring. Mild facet arthropathy. Impression: L4-5 degenerative anterolisthesis consistent with previous MRI scan    PMFS History: Patient Active Problem List   Diagnosis Date Noted  . Spinal stenosis of lumbar region with neurogenic claudication 03/21/2017  . Left lumbar radiculopathy 01/30/2017  . Right cervical radiculopathy 05/14/2014  . Neck pain on right side 09/16/2013  . Empty sella ([place]) 03/17/2013  . Headache(784.0) 03/10/2013  . Blurred vision, bilateral 03/10/2013  . Umbilical hernia 42/70/6237  . Lumbar disc disease 10/08/2012  . Constipation 10/08/2012  . Left shoulder pain 08/05/2012  . Encounter for preventative adult health care exam with abnormal findings 06/08/2012  . 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 BACK PAIN 04/11/2010  . PERIPHERAL EDEMA 04/11/2010  . EPISTAXIS, RECURRENT 04/11/2010  . GERD 05/24/2009  . MENOPAUSAL DISORDER 05/24/2009  . FATIGUE 11/12/2008  . COMPUTERIZED TOMOGRAPHY, CHEST, ABNORMAL 11/12/2008  . Hyperlipidemia 06/01/2008  . PULMONARY NODULE, RIGHT LOWER LOBE 01/09/2008  . Iron deficiency anemia 01/07/2008  . ALLERGIC RHINITIS 01/07/2008  . DIVERTICULOSIS, COLON 01/07/2008  . CHEST PAIN 01/07/2008  . Type II diabetes mellitus with manifestations ([place]) 06/24/2007  . Essential hypertension 06/24/2007   Past Medical History:  Diagnosis Date  . ALLERGIC RHINITIS 01/07/2008   Qualifier: Diagnosis of  By: Jenny Reichmann MD, Shelbie Hutching DEFICIENCY 01/07/2008   Qualifier:  Diagnosis of  By: Jenny Reichmann MD, Hunt Oris   . DIABETES MELLITUS, TYPE II 06/24/2007   Qualifier: Diagnosis of  By: Jenny Reichmann MD, Hunt Oris   . DIVERTICULOSIS, COLON 01/07/2008   Qualifier: Diagnosis of  By: Jenny Reichmann MD, Hunt Oris   . Empty sella ([place]) 03/17/2013  . GERD 05/24/2009   Qualifier: Diagnosis of  By: Jenny Reichmann MD, Hunt Oris   . HYPERLIPIDEMIA 06/01/2008   Qualifier: Diagnosis of  By: Jenny Reichmann MD, Hunt Oris   . HYPERTENSION 06/24/2007   Qualifier: Diagnosis of  By: Jenny Reichmann MD, Hunt Oris   . Lumbar disc disease 10/08/2012   l4-5 per CT 2008  . 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 PULMONARY NODULE, RIGHT LOWER LOBE 01/09/2008   Qualifier: Diagnosis of  By: Jenny Reichmann MD, Hunt Oris   . Umbilical hernia 62/83/1517   Fat containing on CT 2008    Family History  Problem Relation Age of Onset  . Hypertension Father   . Colon cancer Neg Hx     Past Surgical History:  Procedure Laterality Date  . NO PAST SURGERIES    . UPPER GASTROINTESTINAL ENDOSCOPY  Social History   Occupational History  . Not on file.   Social History Main Topics  . Smoking status: Never Smoker  . Smokeless tobacco: Never Used  . Alcohol use No  . Drug use: No  . Sexual activity: Not on file

## 2017-04-25 ENCOUNTER — Ambulatory Visit (INDEPENDENT_AMBULATORY_CARE_PROVIDER_SITE_OTHER): Payer: Medicare Other | Admitting: Orthopaedic Surgery

## 2017-04-25 ENCOUNTER — Encounter (INDEPENDENT_AMBULATORY_CARE_PROVIDER_SITE_OTHER): Payer: Self-pay

## 2017-05-01 ENCOUNTER — Ambulatory Visit (INDEPENDENT_AMBULATORY_CARE_PROVIDER_SITE_OTHER): Payer: Medicare Other | Admitting: Orthopaedic Surgery

## 2017-05-02 ENCOUNTER — Other Ambulatory Visit: Payer: Self-pay | Admitting: Internal Medicine

## 2017-05-09 ENCOUNTER — Ambulatory Visit: Payer: Medicare Other | Admitting: Internal Medicine

## 2017-05-10 ENCOUNTER — Ambulatory Visit: Payer: Medicare Other | Admitting: Internal Medicine

## 2017-05-15 ENCOUNTER — Ambulatory Visit (INDEPENDENT_AMBULATORY_CARE_PROVIDER_SITE_OTHER): Payer: Medicare Other | Admitting: Internal Medicine

## 2017-05-15 ENCOUNTER — Other Ambulatory Visit (INDEPENDENT_AMBULATORY_CARE_PROVIDER_SITE_OTHER): Payer: Medicare Other

## 2017-05-15 ENCOUNTER — Encounter: Payer: Self-pay | Admitting: Internal Medicine

## 2017-05-15 VITALS — BP 120/78 | HR 78 | Ht 67.0 in | Wt 198.0 lb

## 2017-05-15 DIAGNOSIS — Z1239 Encounter for other screening for malignant neoplasm of breast: Secondary | ICD-10-CM

## 2017-05-15 DIAGNOSIS — Z1231 Encounter for screening mammogram for malignant neoplasm of breast: Secondary | ICD-10-CM

## 2017-05-15 DIAGNOSIS — Z0000 Encounter for general adult medical examination without abnormal findings: Secondary | ICD-10-CM

## 2017-05-15 DIAGNOSIS — E118 Type 2 diabetes mellitus with unspecified complications: Secondary | ICD-10-CM

## 2017-05-15 DIAGNOSIS — E2839 Other primary ovarian failure: Secondary | ICD-10-CM

## 2017-05-15 DIAGNOSIS — I10 Essential (primary) hypertension: Secondary | ICD-10-CM | POA: Diagnosis not present

## 2017-05-15 LAB — CBC WITH DIFFERENTIAL/PLATELET
BASOS ABS: 0.1 10*3/uL (ref 0.0–0.1)
Basophils Relative: 1 % (ref 0.0–3.0)
EOS ABS: 0.2 10*3/uL (ref 0.0–0.7)
Eosinophils Relative: 2.8 % (ref 0.0–5.0)
HEMATOCRIT: 39.5 % (ref 36.0–46.0)
HEMOGLOBIN: 13.1 g/dL (ref 12.0–15.0)
LYMPHS PCT: 42.9 % (ref 12.0–46.0)
Lymphs Abs: 2.4 10*3/uL (ref 0.7–4.0)
MCHC: 33.3 g/dL (ref 30.0–36.0)
MCV: 85.3 fl (ref 78.0–100.0)
Monocytes Absolute: 0.4 10*3/uL (ref 0.1–1.0)
Monocytes Relative: 7.2 % (ref 3.0–12.0)
NEUTROS ABS: 2.6 10*3/uL (ref 1.4–7.7)
Neutrophils Relative %: 46.1 % (ref 43.0–77.0)
Platelets: 198 10*3/uL (ref 150.0–400.0)
RBC: 4.63 Mil/uL (ref 3.87–5.11)
RDW: 13.5 % (ref 11.5–15.5)
WBC: 5.6 10*3/uL (ref 4.0–10.5)

## 2017-05-15 LAB — BASIC METABOLIC PANEL
BUN: 15 mg/dL (ref 6–23)
CHLORIDE: 101 meq/L (ref 96–112)
CO2: 29 mEq/L (ref 19–32)
CREATININE: 0.96 mg/dL (ref 0.40–1.20)
Calcium: 10 mg/dL (ref 8.4–10.5)
GFR: 73.9 mL/min (ref >60.00)
Glucose, Bld: 232 mg/dL — ABNORMAL HIGH (ref 70–99)
Potassium: 4.3 mEq/L (ref 3.5–5.1)
Sodium: 137 mEq/L (ref 135–145)

## 2017-05-15 LAB — MICROALBUMIN / CREATININE URINE RATIO
Creatinine,U: 49.1 mg/dL
Microalb Creat Ratio: 1.4 mg/g (ref 0.0–30.0)
Microalb, Ur: <0.7 mg/dL (ref 0.0–1.9)

## 2017-05-15 LAB — URINALYSIS, ROUTINE W REFLEX MICROSCOPIC
BILIRUBIN URINE: NEGATIVE
HGB URINE DIPSTICK: NEGATIVE
KETONES UR: NEGATIVE
LEUKOCYTES UA: NEGATIVE
NITRITE: NEGATIVE
PH: 6 (ref 5.0–8.0)
Specific Gravity, Urine: 1.015 (ref 1.000–1.030)
Total Protein, Urine: NEGATIVE
UROBILINOGEN UA: 0.2 (ref 0.0–1.0)
Urine Glucose: NEGATIVE

## 2017-05-15 LAB — LIPID PANEL
CHOLESTEROL: 138 mg/dL (ref 0–200)
HDL: 52.2 mg/dL (ref >39.00)
LDL CALC: 57 mg/dL (ref 0–99)
NonHDL: 86.03
TRIGLYCERIDES: 144 mg/dL (ref 0.0–149.0)
Total CHOL/HDL Ratio: 3
VLDL: 28.8 mg/dL (ref 0.0–40.0)

## 2017-05-15 LAB — HEPATIC FUNCTION PANEL
ALT: 31 U/L (ref 0–35)
AST: 26 U/L (ref 0–37)
Albumin: 4.2 g/dL (ref 3.5–5.2)
Alkaline Phosphatase: 78 U/L (ref 39–117)
BILIRUBIN DIRECT: 0.1 mg/dL (ref 0.0–0.3)
BILIRUBIN TOTAL: 0.5 mg/dL (ref 0.2–1.2)
TOTAL PROTEIN: 7.4 g/dL (ref 6.0–8.3)

## 2017-05-15 LAB — TSH: TSH: 1.4 u[IU]/mL (ref 0.35–4.50)

## 2017-05-15 LAB — HEMOGLOBIN A1C: Hgb A1c MFr Bld: 7.4 % — ABNORMAL HIGH (ref 4.6–6.5)

## 2017-05-15 NOTE — Assessment & Plan Note (Signed)
stable overall by history and exam, recent data reviewed with pt, and pt to continue medical treatment as before,  to f/u any worsening symptoms or concerns Lab Results  Component Value Date   HGBA1C 6.4 10/03/2016   foi f/u labs

## 2017-05-15 NOTE — Assessment & Plan Note (Signed)

## 2017-05-15 NOTE — Patient Instructions (Addendum)
You will be contacted regarding the referral for: Eye doctor, and Mammogram  Please schedule the bone density test before leaving today at the scheduling desk (where you check out)  Please continue all other medications as before, and refills have been done if requested.  Please have the pharmacy call with any other refills you may need.  Please continue your efforts at being more active, low cholesterol diet, and weight control.  You are otherwise up to date with prevention measures today.  Please keep your appointments with your specialists as you may have planned  Please go to the LAB in the Basement (turn left off the elevator) for the tests to be done today  You will be contacted by phone if any changes need to be made immediately.  Otherwise, you will receive a letter about your results with an explanation, but please check with MyChart first.  Please remember to sign up for MyChart if you have not done so, as this will be important to you in the future with finding out test results, communicating by private email, and scheduling acute appointments online when needed.  If you have Medicare related insurance (such as traditional Medicare, Blue [employer] or [employer], or similar), Please make an appointment at the [facility] with Sharee Pimple, the [employer], for your Wellness Visit in this office, which is a benefit with your insurance.  Please return in 6 months, or sooner if needed, with Lab testing done 3-5 days before

## 2017-05-15 NOTE — Progress Notes (Signed)
Subjective:    Patient ID: Andrea Anthony, female    DOB: 1945-12-19, 71 y.o.   MRN: [id number]  HPI  Here for wellness and f/u with [place] daughter for interpretor a sshe always does;  Overall doing ok;  Pt denies Chest pain, worsening SOB, DOE, wheezing, orthopnea, PND, worsening LE edema, palpitations, dizziness or syncope.  Pt denies neurological change such as new headache, facial or extremity weakness.  Pt denies polydipsia, polyuria, or low sugar symptoms. Pt states overall good compliance with treatment and medications, good tolerability, and has been trying to follow appropriate diet.  Pt denies worsening depressive symptoms, suicidal ideation or panic. No fever, night sweats, wt loss, loss of appetite, or other constitutional symptoms.  Pt states good ability with ADL's, has low fall risk, home safety reviewed and adequate, no other significant changes in hearing or vision, and occasionally active with exercise. Has seen Dr yates/ortho, with recommendation to cont gym exercise and f/u at 6 wks after findings of  Acute bilateral low back pain with bilateral sciatica and  Spinal stenosis of lumbar region with neurogenic claudication . Pt has not f/u as she was concerned she might need surgury, which she is trying to avoid.  Pain is improved as well.  Not going as much to the gym however, "taking a break" but intends to go back to walking soon.  Due for eye doctor f/u, but her last MD retired.   Past Medical History:  Diagnosis Date  . ALLERGIC RHINITIS 01/07/2008   Qualifier: Diagnosis of  By: Jenny Reichmann MD, Shelbie Hutching DEFICIENCY 01/07/2008   Qualifier: Diagnosis of  By: Jenny Reichmann MD, Hunt Oris   . DIABETES MELLITUS, TYPE II 06/24/2007   Qualifier: Diagnosis of  By: Jenny Reichmann MD, Hunt Oris   . DIVERTICULOSIS, COLON 01/07/2008   Qualifier: Diagnosis of  By: Jenny Reichmann MD, Hunt Oris   . Empty sella ([place]) 03/17/2013  . GERD 05/24/2009   Qualifier: Diagnosis of  By: Jenny Reichmann MD, Hunt Oris   . HYPERLIPIDEMIA  06/01/2008   Qualifier: Diagnosis of  By: Jenny Reichmann MD, Hunt Oris   . HYPERTENSION 06/24/2007   Qualifier: Diagnosis of  By: Jenny Reichmann MD, Hunt Oris   . Lumbar disc disease 10/08/2012   l4-5 per CT 2008  . 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 PULMONARY NODULE, RIGHT LOWER LOBE 01/09/2008   Qualifier: Diagnosis of  By: Jenny Reichmann MD, Hunt Oris   . Umbilical hernia 49/20/1007   Fat containing on CT 2008   Past Surgical History:  Procedure Laterality Date  . NO PAST SURGERIES    . UPPER GASTROINTESTINAL ENDOSCOPY      reports that she has never smoked. She has never used smokeless tobacco. She reports that she does not drink alcohol or use drugs. family history includes Hypertension in her father. Allergies  Allergen Reactions  . Ace Inhibitors     REACTION: cough  . Sulfonamide Derivatives     Not sure of reaction   Current Outpatient Prescriptions on File Prior to Visit  Medication Sig Dispense Refill  . amLODipine (NORVASC) 5 MG tablet TAKE 1 TABLET (5 MG TOTAL) BY MOUTH DAILY. 90 tablet 2  . aspirin 81 MG tablet Take 81 mg by mouth daily.    . Blood Glucose Monitoring Suppl (ONETOUCH VERIO FLEX SYSTEM) w/Device KIT 1 Act by Does not apply route 3 (three) times daily with meals. 2 kit 0  . glipiZIDE (GLUCOTROL XL) 2.5 MG 24 hr  tablet TAKE ONE TABLET BY MOUTH ONCE DAILY WITH BREAKFAST 90 tablet 1  . glucose blood (ONETOUCH VERIO) test strip Use to check blood sugars three times a day Dx E11.9 100 each 11  . irbesartan (AVAPRO) 300 MG tablet Take 1 tablet (300 mg total) by mouth daily. 90 tablet 3  . meloxicam (MOBIC) 15 MG tablet TAKE 1 TABLET (15 MG TOTAL) BY MOUTH DAILY AS NEEDED FOR PAIN. 30 tablet 4  . metFORMIN (GLUCOPHAGE) 500 MG tablet TAKE 2 TABLETS (1,000 MG TOTAL) BY MOUTH 2 (TWO) TIMES DAILY WITH A MEAL. 360 tablet 1  . nebivolol (BYSTOLIC) 5 MG tablet Take 1 tablet (5 mg total) by mouth daily. 90 tablet 3  . ONETOUCH DELICA LANCETS 74J MISC Use to help check blood sugars three  times a day Dx E11.9 100 each 11  . pravastatin (PRAVACHOL) 40 MG tablet TAKE 1 TABLET (40 MG TOTAL) BY MOUTH DAILY. 90 tablet 2  . traMADol (ULTRAM) 50 MG tablet Take 1 tablet (50 mg total) by mouth every 8 (eight) hours as needed. 60 tablet 0   No current facility-administered medications on file prior to visit.    Review of Systems Constitutional: Negative for other unusual diaphoresis, sweats, appetite or weight changes HENT: Negative for other worsening hearing loss, ear pain, facial swelling, mouth sores or neck stiffness.   Eyes: Negative for other worsening pain, redness or other visual disturbance.  Respiratory: Negative for other stridor or swelling Cardiovascular: Negative for other palpitations or other chest pain  Gastrointestinal: Negative for worsening diarrhea or loose stools, blood in stool, distention or other pain Genitourinary: Negative for hematuria, flank pain or other change in urine volume.  Musculoskeletal: Negative for myalgias or other joint swelling.  Skin: Negative for other color change, or other wound or worsening drainage.  Neurological: Negative for other syncope or numbness. Hematological: Negative for other adenopathy or swelling Psychiatric/Behavioral: Negative for hallucinations, other worsening agitation, SI, self-injury, or new decreased concentration All other system neg per pt    Objective:   Physical Exam BP 120/78   Pulse 78   Ht _0  (1.702 m)   Wt 198 lb (89.8 kg)   SpO2 99%   BMI 31.01 kg/m  VS noted, obese Constitutional: Pt is oriented to person, place, and time. Appears well-developed and well-nourished, in no significant distress and comfortable Head: Normocephalic and atraumatic  Eyes: Conjunctivae and EOM are normal. Pupils are equal, round, and reactive to light Right Ear: External ear normal without discharge Left Ear: External ear normal without discharge Nose: Nose without discharge or deformity Mouth/Throat: Oropharynx is  without other ulcerations and moist  Neck: Normal range of motion. Neck supple. No JVD present. No tracheal deviation present or significant neck LA or mass Cardiovascular: Normal rate, regular rhythm, normal heart sounds and intact distal pulses.   Pulmonary/Chest: WOB normal and breath sounds without rales or wheezing  Abdominal: Soft. Bowel sounds are normal. NT. No HSM  Musculoskeletal: Normal range of motion. Exhibits no edema Lymphadenopathy: Has no other cervical adenopathy.  Neurological: Pt is alert and oriented to person, place, and time. Pt has normal reflexes. No cranial nerve deficit. Motor grossly intact, Gait intact Skin: Skin is warm and dry. No rash noted or new ulcerations Psychiatric:  Has normal mood and affect. Behavior is normal without agitation No other exam findings Lab Results  Component Value Date   WBC 8.5 10/03/2016   HGB 13.9 10/03/2016   HCT 42.1 10/03/2016   PLT  229.0 10/03/2016   GLUCOSE 89 10/03/2016   CHOL 131 05/09/2016   TRIG 80.0 05/09/2016   HDL 46.60 05/09/2016   LDLCALC 68 05/09/2016   ALT 24 05/09/2016   AST 20 05/09/2016   NA 139 10/03/2016   K 4.3 10/03/2016   CL 103 10/03/2016   CREATININE 0.88 10/03/2016   BUN 14 10/03/2016   CO2 29 10/03/2016   TSH 0.95 10/03/2016   HGBA1C 6.4 10/03/2016   MICROALBUR <0.7 05/09/2016       Assessment & Plan:

## 2017-05-15 NOTE — Assessment & Plan Note (Signed)
stable overall by history and exam, recent data reviewed with pt, and pt to continue medical treatment as before,  to f/u any worsening symptoms or concerns BP Readings from Last 3 Encounters:  05/15/17 120/78  03/21/17 (!) 150/91  01/30/17 (!) 142/88

## 2017-05-21 ENCOUNTER — Other Ambulatory Visit: Payer: Medicare Other

## 2017-05-28 ENCOUNTER — Ambulatory Visit (INDEPENDENT_AMBULATORY_CARE_PROVIDER_SITE_OTHER)
Admission: RE | Admit: 2017-05-28 | Discharge: 2017-05-28 | Disposition: A | Discharge status: Home / Self Care | Payer: Medicare Other | Source: Ambulatory Visit | Attending: Internal Medicine | Admitting: Internal Medicine

## 2017-05-28 DIAGNOSIS — E2839 Other primary ovarian failure: Secondary | ICD-10-CM | POA: Diagnosis not present

## 2017-06-02 ENCOUNTER — Encounter: Payer: Self-pay | Admitting: Internal Medicine

## 2017-07-09 ENCOUNTER — Ambulatory Visit: Payer: Medicare Other

## 2017-07-16 ENCOUNTER — Ambulatory Visit: Payer: Medicare Other

## 2017-07-30 DIAGNOSIS — H25813 Combined forms of age-related cataract, bilateral: Secondary | ICD-10-CM | POA: Diagnosis not present

## 2017-07-30 DIAGNOSIS — H524 Presbyopia: Secondary | ICD-10-CM | POA: Diagnosis not present

## 2017-07-30 DIAGNOSIS — H43393 Other vitreous opacities, bilateral: Secondary | ICD-10-CM | POA: Diagnosis not present

## 2017-07-30 DIAGNOSIS — H04123 Dry eye syndrome of bilateral lacrimal glands: Secondary | ICD-10-CM | POA: Diagnosis not present

## 2017-07-30 DIAGNOSIS — E119 Type 2 diabetes mellitus without complications: Secondary | ICD-10-CM | POA: Diagnosis not present

## 2017-07-30 LAB — HM DIABETES EYE EXAM

## 2017-10-19 ENCOUNTER — Telehealth: Payer: Self-pay | Admitting: Internal Medicine

## 2017-10-19 NOTE — Telephone Encounter (Signed)
Left vm for caller and patient.

## 2017-10-19 NOTE — Telephone Encounter (Signed)
Patient is diabetic - 11.4 on A1C.  - took today

## 2017-10-19 NOTE — Telephone Encounter (Signed)
Ok to ask pt for ROV to recheck as this Very High compared to the 7.4 done last in June 2018

## 2017-10-23 ENCOUNTER — Ambulatory Visit (INDEPENDENT_AMBULATORY_CARE_PROVIDER_SITE_OTHER): Payer: Medicare Other | Admitting: Internal Medicine

## 2017-10-23 ENCOUNTER — Other Ambulatory Visit (INDEPENDENT_AMBULATORY_CARE_PROVIDER_SITE_OTHER): Payer: Medicare Other

## 2017-10-23 ENCOUNTER — Encounter: Payer: Self-pay | Admitting: Internal Medicine

## 2017-10-23 VITALS — BP 136/84 | HR 78 | Temp 98.3°F | Ht 67.0 in | Wt 195.0 lb

## 2017-10-23 DIAGNOSIS — E118 Type 2 diabetes mellitus with unspecified complications: Secondary | ICD-10-CM | POA: Diagnosis not present

## 2017-10-23 DIAGNOSIS — Z1239 Encounter for other screening for malignant neoplasm of breast: Secondary | ICD-10-CM

## 2017-10-23 DIAGNOSIS — Z1231 Encounter for screening mammogram for malignant neoplasm of breast: Secondary | ICD-10-CM | POA: Diagnosis not present

## 2017-10-23 DIAGNOSIS — I10 Essential (primary) hypertension: Secondary | ICD-10-CM | POA: Diagnosis not present

## 2017-10-23 DIAGNOSIS — Z23 Encounter for immunization: Secondary | ICD-10-CM | POA: Diagnosis not present

## 2017-10-23 DIAGNOSIS — E785 Hyperlipidemia, unspecified: Secondary | ICD-10-CM | POA: Diagnosis not present

## 2017-10-23 LAB — HEPATIC FUNCTION PANEL
ALBUMIN: 4.2 g/dL (ref 3.5–5.2)
ALK PHOS: 78 U/L (ref 39–117)
ALT: 40 U/L — AB (ref 0–35)
AST: 34 U/L (ref 0–37)
Bilirubin, Direct: 0.2 mg/dL (ref 0.0–0.3)
TOTAL PROTEIN: 7.4 g/dL (ref 6.0–8.3)
Total Bilirubin: 0.7 mg/dL (ref 0.2–1.2)

## 2017-10-23 LAB — BASIC METABOLIC PANEL
BUN: 14 mg/dL (ref 6–23)
CO2: 29 mEq/L (ref 19–32)
Calcium: 9.9 mg/dL (ref 8.4–10.5)
Chloride: 102 mEq/L (ref 96–112)
Creatinine, Ser: 0.88 mg/dL (ref 0.40–1.20)
GFR: 81.6 mL/min (ref >60.00)
Glucose, Bld: 127 mg/dL — ABNORMAL HIGH (ref 70–99)
POTASSIUM: 4.5 meq/L (ref 3.5–5.1)
Sodium: 138 mEq/L (ref 135–145)

## 2017-10-23 LAB — LIPID PANEL
CHOLESTEROL: 147 mg/dL (ref 0–200)
HDL: 53.1 mg/dL (ref >39.00)
LDL Cholesterol: 72 mg/dL (ref 0–99)
NonHDL: 94.38
Total CHOL/HDL Ratio: 3
Triglycerides: 113 mg/dL (ref 0.0–149.0)
VLDL: 22.6 mg/dL (ref 0.0–40.0)

## 2017-10-23 LAB — HEMOGLOBIN A1C: HEMOGLOBIN A1C: 7.6 % — AB (ref 4.6–6.5)

## 2017-10-23 MED ORDER — PRAVASTATIN SODIUM 40 MG PO TABS: ORAL_TABLET | ORAL | 3 refills | Status: DC

## 2017-10-23 MED ORDER — AMLODIPINE BESYLATE 5 MG PO TABS
ORAL_TABLET | ORAL | 3 refills | Status: DC
Start: 2017-10-23 — End: 2018-11-29

## 2017-10-23 MED ORDER — GLIPIZIDE ER 2.5 MG PO TB24: ORAL_TABLET | ORAL | 3 refills | Status: DC

## 2017-10-23 MED ORDER — NEBIVOLOL HCL 5 MG PO TABS: 5.0000 mg | ORAL_TABLET | Freq: Every day | ORAL | 3 refills | Status: DC

## 2017-10-23 MED ORDER — ONETOUCH VERIO FLEX SYSTEM W/DEVICE KIT: 1.0000 | PACK | Freq: Three times a day (TID) | 0 refills | Status: DC

## 2017-10-23 MED ORDER — GLUCOSE BLOOD VI STRP: ORAL_STRIP | 11 refills | Status: DC

## 2017-10-23 MED ORDER — ONETOUCH DELICA LANCETS 33G MISC: 11 refills | Status: DC

## 2017-10-23 MED ORDER — MELOXICAM 15 MG PO TABS: ORAL_TABLET | ORAL | 4 refills | Status: DC

## 2017-10-23 MED ORDER — METFORMIN HCL 500 MG PO TABS: 1000.0000 mg | ORAL_TABLET | Freq: Two times a day (BID) | ORAL | 3 refills | Status: DC

## 2017-10-23 MED ORDER — IRBESARTAN 300 MG PO TABS: 300.0000 mg | ORAL_TABLET | Freq: Every day | ORAL | 3 refills | Status: DC

## 2017-10-23 NOTE — Progress Notes (Signed)
   Subjective:    Patient ID: Andrea Anthony, female    DOB: [birth year], 71 y.o.   MRN: [id number]  HPI  Here to f/u; overall doing ok,  Pt denies chest pain, increasing sob or doe, wheezing, orthopnea, PND, increased LE swelling, palpitations, dizziness or syncope.  Pt denies new neurological symptoms such as new headache, or facial or extremity weakness or numbness.  Pt denies polydipsia, polyuria, or low sugar episode.  Pt states overall good compliance with meds, mostly trying to follow appropriate diet, with wt overall stable,  but little exercise however, and sugars have been somewhat more labile recently in the 50-140's.  Due for mammogram, pneumovax, needs new glucometer and supplies Wt Readings from Last 3 Encounters:  10/23/17 195 lb (88.5 kg)  05/15/17 198 lb (89.8 kg)  03/21/17 180 lb (81.6 kg)  .[id number] Current Outpatient Medications on File Prior to Visit  Medication Sig Dispense Refill  . aspirin 81 MG tablet Take 81 mg by mouth daily.    . traMADol (ULTRAM) 50 MG tablet Take 1 tablet (50 mg total) by mouth every 8 (eight) hours as needed. 60 tablet 0   No current facility-administered medications on file prior to visit.    Review of Systems  Constitutional: Negative for other unusual diaphoresis or sweats HENT: Negative for ear discharge or swelling Eyes: Negative for other worsening visual disturbances Respiratory: Negative for stridor or other swelling  Gastrointestinal: Negative for worsening distension or other blood Genitourinary: Negative for retention or other urinary change Musculoskeletal: Negative for other MSK pain or swelling Skin: Negative for color change or other new lesions Neurological: Negative for worsening tremors and other numbness  Psychiatric/Behavioral: Negative for worsening agitation or other fatigue All other system neg per pt    Objective:   Physical Exam BP 136/84   Pulse 78   Temp 98.3 F (36.8 C) (Oral)   Ht 5\' 7"  (1.702 m)   Wt 195  lb (88.5 kg)   SpO2 99%   BMI 30.54 kg/m  VS noted,  Constitutional: Pt appears in NAD HENT: Head: NCAT.  Right Ear: External ear normal.  Left Ear: External ear normal.  Eyes: . Pupils are equal, round, and reactive to light. Conjunctivae and EOM are normal Nose: without d/c or deformity Neck: Neck supple. Gross normal ROM Cardiovascular: Normal rate and regular rhythm.   Pulmonary/Chest: Effort normal and breath sounds without rales or wheezing.  Abd:  Soft, NT, ND, + BS, no organomegaly Neurological: Pt is alert. At baseline orientation, motor grossly intact Skin: Skin is warm. No rashes, other new lesions, no LE edema Psychiatric: Pt behavior is normal without agitation  No other exam findings    Assessment & Plan:

## 2017-10-23 NOTE — Patient Instructions (Addendum)
You had the flu shot today, and the Pneumovax shot  Please continue all other medications as before, and refills have been done if requested.  Please have the pharmacy call with any other refills you may need.  Please continue your efforts at being more active, low cholesterol diet, and weight control.  You are otherwise up to date with prevention measures today.  Please keep your appointments with your specialists as you may have planned  You will be contacted regarding the referral for: Mammogram  Your meter and supplies were sent to the pharmacy  Please go to the LAB in the Basement (turn left off the elevator) for the tests to be done today  You will be contacted by phone if any changes need to be made immediately.  Otherwise, you will receive a letter about your results with an explanation, but please check with MyChart first.  Please remember to sign up for MyChart if you have not done so, as this will be important to you in the future with finding out test results, communicating by private email, and scheduling acute appointments online when needed.  Please return in 6 months, or sooner if needed

## 2017-10-27 NOTE — Assessment & Plan Note (Signed)
stable overall by history and exam, recent data reviewed with pt, and pt to continue medical treatment as before,  to f/u any worsening symptoms or concerns, for f/u a1c, and carefull med titration given the labile sugars

## 2017-10-27 NOTE — Assessment & Plan Note (Signed)
Lab Results  Component Value Date   LDLCALC 72 10/23/2017  stable overall by history and exam, recent data reviewed with pt, and pt to continue medical treatment as before,  to f/u any worsening symptoms or concerns 

## 2017-10-27 NOTE — Assessment & Plan Note (Signed)
stable overall by history and exam, recent data reviewed with pt, and pt to continue medical treatment as before,  to f/u any worsening symptoms or concerns BP Readings from Last 3 Encounters:  10/23/17 136/84  05/15/17 120/78  03/21/17 (!) 150/91

## 2017-11-20 ENCOUNTER — Ambulatory Visit: Payer: Medicare Other | Admitting: Internal Medicine

## 2017-12-06 ENCOUNTER — Encounter: Payer: Self-pay | Admitting: Internal Medicine

## 2018-04-23 ENCOUNTER — Ambulatory Visit: Payer: Medicare Other | Admitting: Internal Medicine

## 2018-05-23 ENCOUNTER — Other Ambulatory Visit: Payer: Self-pay | Admitting: Internal Medicine

## 2018-06-05 ENCOUNTER — Ambulatory Visit (INDEPENDENT_AMBULATORY_CARE_PROVIDER_SITE_OTHER): Payer: Medicare Other | Admitting: Internal Medicine

## 2018-06-05 ENCOUNTER — Encounter: Payer: Self-pay | Admitting: Internal Medicine

## 2018-06-05 ENCOUNTER — Other Ambulatory Visit (INDEPENDENT_AMBULATORY_CARE_PROVIDER_SITE_OTHER): Payer: Medicare Other

## 2018-06-05 ENCOUNTER — Ambulatory Visit (INDEPENDENT_AMBULATORY_CARE_PROVIDER_SITE_OTHER)
Admission: RE | Admit: 2018-06-05 | Discharge: 2018-06-05 | Disposition: A | Discharge status: Home / Self Care | Payer: Medicare Other | Source: Ambulatory Visit | Attending: Internal Medicine | Admitting: Internal Medicine

## 2018-06-05 VITALS — BP 130/84 | HR 66 | Ht 67.0 in | Wt 177.0 lb

## 2018-06-05 DIAGNOSIS — E118 Type 2 diabetes mellitus with unspecified complications: Secondary | ICD-10-CM

## 2018-06-05 DIAGNOSIS — R0989 Other specified symptoms and signs involving the circulatory and respiratory systems: Secondary | ICD-10-CM | POA: Diagnosis not present

## 2018-06-05 DIAGNOSIS — E785 Hyperlipidemia, unspecified: Secondary | ICD-10-CM | POA: Diagnosis not present

## 2018-06-05 DIAGNOSIS — R634 Abnormal weight loss: Secondary | ICD-10-CM | POA: Insufficient documentation

## 2018-06-05 DIAGNOSIS — I10 Essential (primary) hypertension: Secondary | ICD-10-CM | POA: Diagnosis not present

## 2018-06-05 DIAGNOSIS — Z23 Encounter for immunization: Secondary | ICD-10-CM | POA: Diagnosis not present

## 2018-06-05 LAB — MICROALBUMIN / CREATININE URINE RATIO
CREATININE, U: 43.8 mg/dL
Microalb Creat Ratio: 1.6 mg/g (ref 0.0–30.0)

## 2018-06-05 LAB — CBC WITH DIFFERENTIAL/PLATELET
BASOS ABS: 0.1 10*3/uL (ref 0.0–0.1)
Basophils Relative: 0.9 % (ref 0.0–3.0)
Eosinophils Absolute: 0.1 10*3/uL (ref 0.0–0.7)
Eosinophils Relative: 1.9 % (ref 0.0–5.0)
HEMATOCRIT: 40.1 % (ref 36.0–46.0)
HEMOGLOBIN: 13.3 g/dL (ref 12.0–15.0)
LYMPHS PCT: 46.8 % — AB (ref 12.0–46.0)
Lymphs Abs: 2.6 10*3/uL (ref 0.7–4.0)
MCHC: 33.2 g/dL (ref 30.0–36.0)
MCV: 88.5 fl (ref 78.0–100.0)
MONOS PCT: 7.6 % (ref 3.0–12.0)
Monocytes Absolute: 0.4 10*3/uL (ref 0.1–1.0)
NEUTROS ABS: 2.3 10*3/uL (ref 1.4–7.7)
Neutrophils Relative %: 42.8 % — ABNORMAL LOW (ref 43.0–77.0)
Platelets: 233 10*3/uL (ref 150.0–400.0)
RBC: 4.53 Mil/uL (ref 3.87–5.11)
RDW: 12.9 % (ref 11.5–15.5)
WBC: 5.5 10*3/uL (ref 4.0–10.5)

## 2018-06-05 LAB — BASIC METABOLIC PANEL
BUN: 14 mg/dL (ref 6–23)
CALCIUM: 9.9 mg/dL (ref 8.4–10.5)
CHLORIDE: 102 meq/L (ref 96–112)
CO2: 29 mEq/L (ref 19–32)
CREATININE: 0.89 mg/dL (ref 0.40–1.20)
GFR: 80.4 mL/min (ref >60.00)
Glucose, Bld: 148 mg/dL — ABNORMAL HIGH (ref 70–99)
Potassium: 4.3 mEq/L (ref 3.5–5.1)
Sodium: 137 mEq/L (ref 135–145)

## 2018-06-05 LAB — HEPATIC FUNCTION PANEL
ALK PHOS: 77 U/L (ref 39–117)
ALT: 33 U/L (ref 0–35)
AST: 23 U/L (ref 0–37)
Albumin: 4.3 g/dL (ref 3.5–5.2)
Bilirubin, Direct: 0.1 mg/dL (ref 0.0–0.3)
TOTAL PROTEIN: 7.5 g/dL (ref 6.0–8.3)
Total Bilirubin: 0.5 mg/dL (ref 0.2–1.2)

## 2018-06-05 LAB — LIPID PANEL
CHOLESTEROL: 141 mg/dL (ref 0–200)
HDL: 51.4 mg/dL (ref >39.00)
LDL CALC: 70 mg/dL (ref 0–99)
NonHDL: 90.08
TRIGLYCERIDES: 102 mg/dL (ref 0.0–149.0)
Total CHOL/HDL Ratio: 3
VLDL: 20.4 mg/dL (ref 0.0–40.0)

## 2018-06-05 LAB — URINALYSIS, ROUTINE W REFLEX MICROSCOPIC
Bilirubin Urine: NEGATIVE
Hgb urine dipstick: NEGATIVE
KETONES UR: NEGATIVE
Leukocytes, UA: NEGATIVE
Nitrite: NEGATIVE
PH: 6 (ref 5.0–8.0)
RBC / HPF: NONE SEEN (ref 0–?)
SPECIFIC GRAVITY, URINE: 1.01 (ref 1.000–1.030)
Total Protein, Urine: NEGATIVE
UROBILINOGEN UA: 0.2 (ref 0.0–1.0)
Urine Glucose: NEGATIVE

## 2018-06-05 LAB — HEMOGLOBIN A1C: HEMOGLOBIN A1C: 6.2 % (ref 4.6–6.5)

## 2018-06-05 LAB — TSH: TSH: 1.33 u[IU]/mL (ref 0.35–4.50)

## 2018-06-05 MED ORDER — BLOOD GLUCOSE MONITOR KIT: PACK | 0 refills | Status: DC

## 2018-06-05 NOTE — Patient Instructions (Addendum)
You had the Pneumovax pneumonia shot today  Ok to stop the meloxicam for pain  Ok to take Tylenol as you do  Ok to stop the glipizide for sugar  A new glucometer with supplies prescription was sent to the pharmacy  Please continue all other medications as before, and refills have been done if requested.  Please have the pharmacy call with any other refills you may need.  Please continue your efforts at being more active, low cholesterol diet, and weight control.  You are otherwise up to date with prevention measures today.  Please keep your appointments with your specialists as you may have planned  Please go to the XRAY Department in the Basement (go straight as you get off the elevator) for the x-ray testing  Please go to the LAB in the Basement (turn left off the elevator) for the tests to be done today  You will be contacted by phone if any changes need to be made immediately.  Otherwise, you will receive a letter about your results with an explanation, but please check with MyChart first.  Please remember to sign up for MyChart if you have not done so, as this will be important to you in the future with finding out test results, communicating by private email, and scheduling acute appointments online when needed.  Please return in 6 months, or sooner if needed \

## 2018-06-05 NOTE — Assessment & Plan Note (Signed)
stable overall by history and exam, recent data reviewed with pt, and pt to continue medical treatment as before,  to f/u any worsening symptoms or concerns BP Readings from Last 3 Encounters:  06/05/18 130/84  10/23/17 136/84  05/15/17 120/78

## 2018-06-05 NOTE — Addendum Note (Signed)
Addended by: Cresenciano Lick on: 06/05/2018 10:18 AM   Modules accepted: Orders

## 2018-06-05 NOTE — Progress Notes (Signed)
Subjective:    Patient ID: Andrea Anthony, female    DOB: 07/21/1946, 72 y.o.   MRN: [id number]  HPI  Here for yearly f/u;  Overall doing ok;  Pt denies Chest pain, worsening SOB, DOE, wheezing, orthopnea, PND, worsening LE edema, palpitations, dizziness or syncope.  Pt denies neurological change such as new headache, facial or extremity weakness.  Pt denies polydipsia, polyuria, or low sugar symptoms. Pt states overall good compliance with treatment and medications, good tolerability, and has been trying to follow appropriate diet.  Pt denies worsening depressive symptoms, suicidal ideation or panic. No fever, night sweats, wt loss, loss of appetite, or other constitutional symptoms.  Pt states good ability with ADL's, has low fall risk, home safety reviewed and adequate, no other significant changes in hearing or vision, and only occasionally active with exercise.  Lost 20 lbs with improved less rich diet while in [place] from nov 2018 to earlier this month.  States takes all meds except in the evening has not been taking the PM metformin. Wt Readings from Last 3 Encounters:  06/05/18 177 lb (80.3 kg)  10/23/17 195 lb (88.5 kg)  05/15/17 198 lb (89.8 kg)  CBG's have been in low 100's but sometimes to 60 and 55 recently with recent wt loss on current meds.  Mobic does not seem to help for arthritic pain, but APAP works better. Past Medical History:  Diagnosis Date  . ALLERGIC RHINITIS 01/07/2008   Qualifier: Diagnosis of  By: Jenny Reichmann MD, Shelbie Hutching DEFICIENCY 01/07/2008   Qualifier: Diagnosis of  By: Jenny Reichmann MD, Hunt Oris   . DIABETES MELLITUS, TYPE II 06/24/2007   Qualifier: Diagnosis of  By: Jenny Reichmann MD, Hunt Oris   . DIVERTICULOSIS, COLON 01/07/2008   Qualifier: Diagnosis of  By: Jenny Reichmann MD, Hunt Oris   . Empty sella ([place]) 03/17/2013  . GERD 05/24/2009   Qualifier: Diagnosis of  By: Jenny Reichmann MD, Hunt Oris   . HYPERLIPIDEMIA 06/01/2008   Qualifier: Diagnosis of  By: Jenny Reichmann MD, Hunt Oris   . HYPERTENSION  06/24/2007   Qualifier: Diagnosis of  By: Jenny Reichmann MD, Hunt Oris   . Lumbar disc disease 10/08/2012   l4-5 per CT 2008  . MALT lymphoma ([place]) 06/08/2012   Gastric, 2001  . PULMONARY NODULE, RIGHT LOWER LOBE 01/09/2008   Qualifier: Diagnosis of  By: Jenny Reichmann MD, Hunt Oris   . Umbilical hernia 93/57/0177   Fat containing on CT 2008   Past Surgical History:  Procedure Laterality Date  . NO PAST SURGERIES    . UPPER GASTROINTESTINAL ENDOSCOPY      reports that she has never smoked. She has never used smokeless tobacco. She reports that she does not drink alcohol or use drugs. family history includes Hypertension in her father. Allergies  Allergen Reactions  . Ace Inhibitors     REACTION: cough  . Sulfonamide Derivatives     Not sure of reaction   Current Outpatient Medications on File Prior to Visit  Medication Sig Dispense Refill  . amLODipine (NORVASC) 5 MG tablet TAKE 1 TABLET (5 MG TOTAL) BY MOUTH DAILY. 90 tablet 3  . aspirin 81 MG tablet Take 81 mg by mouth daily.    . Blood Glucose Monitoring Suppl ([facility]) w/Device KIT 1 Act 3 (three) times daily with meals by Does not apply route. 2 kit 0  . glipiZIDE (GLUCOTROL XL) 2.5 MG 24 hr tablet TAKE ONE TABLET BY MOUTH ONCE DAILY WITH BREAKFAST  90 tablet 3  . glucose blood (ONETOUCH VERIO) test strip Use to check blood sugars three times a day Dx E11.9 100 each 11  . irbesartan (AVAPRO) 300 MG tablet Take 1 tablet (300 mg total) daily by mouth. 90 tablet 3  . meloxicam (MOBIC) 15 MG tablet TAKE 1 TABLET BY MOUTH DAILY AS NEEDED FOR PAIN 90 tablet 1  . metFORMIN (GLUCOPHAGE) 500 MG tablet Take 2 tablets (1,000 mg total) 2 (two) times daily with a meal by mouth. 360 tablet 3  . nebivolol (BYSTOLIC) 5 MG tablet Take 1 tablet (5 mg total) daily by mouth. 90 tablet 3  . ONETOUCH DELICA LANCETS 37D MISC Use to help check blood sugars three times a day Dx E11.9 100 each 11  . pravastatin (PRAVACHOL) 40 MG tablet TAKE 1 TABLET (40 MG  TOTAL) BY MOUTH DAILY. 90 tablet 3  . traMADol (ULTRAM) 50 MG tablet Take 1 tablet (50 mg total) by mouth every 8 (eight) hours as needed. 60 tablet 0   No current facility-administered medications on file prior to visit.     Review of Systems Constitutional: Negative for other unusual diaphoresis, sweats, appetite or weight changes HENT: Negative for other worsening hearing loss, ear pain, facial swelling, mouth sores or neck stiffness.   Eyes: Negative for other worsening pain, redness or other visual disturbance.  Respiratory: Negative for other stridor or swelling Cardiovascular: Negative for other palpitations or other chest pain  Gastrointestinal: Negative for worsening diarrhea or loose stools, blood in stool, distention or other pain Genitourinary: Negative for hematuria, flank pain or other change in urine volume.  Musculoskeletal: Negative for myalgias or other joint swelling.  Skin: Negative for other color change, or other wound or worsening drainage.  Neurological: Negative for other syncope or numbness. Hematological: Negative for other adenopathy or swelling Psychiatric/Behavioral: Negative for hallucinations, other worsening agitation, SI, self-injury, or new decreased concentration All other system neg per pt    Objective:   Physical Exam BP 130/84 (BP Location: Left Arm, Patient Position: Sitting, Cuff Size: Normal)   Pulse 66   Ht '5\' 7"'  (1.702 m)   Wt 177 lb (80.3 kg)   SpO2 99%   BMI 27.72 kg/m  VS noted,  Constitutional: Pt is oriented to person, place, and time. Appears well-developed and well-nourished, in no significant distress and comfortable Head: Normocephalic and atraumatic  Eyes: Conjunctivae and EOM are normal. Pupils are equal, round, and reactive to light Right Ear: External ear normal without discharge Left Ear: External ear normal without discharge Nose: Nose without discharge or deformity Mouth/Throat: Oropharynx is without other ulcerations  and moist  Neck: Normal range of motion. Neck supple. No JVD present. No tracheal deviation present or significant neck LA or mass Cardiovascular: Normal rate, regular rhythm, normal heart sounds and intact distal pulses. Pulmonary/Chest: WOB normal and breath sounds without rales or wheezing  Abdominal: Soft. Bowel sounds are normal. NT. No HSM  Musculoskeletal: Normal range of motion. Exhibits no edema Lymphadenopathy: Has no other cervical adenopathy.  Neurological: Pt is alert and oriented to person, place, and time. Pt has normal reflexes. No cranial nerve deficit. Motor grossly intact, Gait intact Skin: Skin is warm and dry. No rash noted or new ulcerations Psychiatric:  Has normal mood and affect. Behavior is normal without agitation No other exam findings  Lab Results  Component Value Date   WBC 5.6 05/15/2017   HGB 13.1 05/15/2017   HCT 39.5 05/15/2017   PLT 198.0 05/15/2017  GLUCOSE 127 (H) 10/23/2017   CHOL 147 10/23/2017   TRIG 113.0 10/23/2017   HDL 53.10 10/23/2017   LDLCALC 72 10/23/2017   ALT 40 (H) 10/23/2017   AST 34 10/23/2017   NA 138 10/23/2017   K 4.5 10/23/2017   CL 102 10/23/2017   CREATININE 0.88 10/23/2017   BUN 14 10/23/2017   CO2 29 10/23/2017   TSH 1.40 05/15/2017   HGBA1C 7.6 (H) 10/23/2017   MICROALBUR <0.7 05/15/2017       Assessment & Plan:

## 2018-06-05 NOTE — Assessment & Plan Note (Signed)
Lab Results  Component Value Date   LDLCALC 72 10/23/2017  stable overall by history and exam, recent data reviewed with pt, and pt to continue medical treatment as before,  to f/u any worsening symptoms or concerns

## 2018-06-05 NOTE — Assessment & Plan Note (Signed)
Possibly due to improved diet with less carbs and more vegtetables while in [place], also for cxr

## 2018-06-05 NOTE — Assessment & Plan Note (Addendum)
Likely overcontrolled with low sugars in light of wt loss, to d/c glipizide, cont check cbgs, and labs today

## 2018-11-04 NOTE — Progress Notes (Signed)
Subjective:    Patient ID: Andrea Anthony, female    DOB: 1946/06/13, 72 y.o.   MRN: [id number]  HPI The patient is here for an acute visit.  She is here with her daughter who interprets.  Cold symptoms; her cold symptoms started more than 4 weeks ago.  She has a deep cough that is productive, but she does have a hard time getting the sputum up.  She is having left-sided rib pain because of the cough.  She has a sore throat and nasal congestion.  Her daughter states she just does not feel well and is been going on for a long time.  She denies any fevers, chills, ear pain, sinus pain, shortness of breath, wheezing and headaches.  She did take some pain medication for the rib pain and that did help.  She also has taken some over-the-counter cough medication which helped.    Medications and allergies reviewed with patient and updated if appropriate.  Patient Active Problem List   Diagnosis Date Noted  . Weight loss 06/05/2018  . Spinal stenosis of lumbar region with neurogenic claudication 03/21/2017  . Left lumbar radiculopathy 01/30/2017  . Right cervical radiculopathy 05/14/2014  . Neck pain on right side 09/16/2013  . Empty sella ([place]) 03/17/2013  . Headache(784.0) 03/10/2013  . Blurred vision, bilateral 03/10/2013  . Umbilical hernia 46/80/3212  . Lumbar disc disease 10/08/2012  . Constipation 10/08/2012  . Left shoulder pain 08/05/2012  . Preventative health care 06/08/2012  . 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 BACK PAIN 04/11/2010  . PERIPHERAL EDEMA 04/11/2010  . EPISTAXIS, RECURRENT 04/11/2010  . GERD 05/24/2009  . MENOPAUSAL DISORDER 05/24/2009  . FATIGUE 11/12/2008  . COMPUTERIZED TOMOGRAPHY, CHEST, ABNORMAL 11/12/2008  . Hyperlipidemia 06/01/2008  . PULMONARY NODULE, RIGHT LOWER LOBE 01/09/2008  . Iron deficiency anemia 01/07/2008  . ALLERGIC RHINITIS 01/07/2008  . DIVERTICULOSIS, COLON 01/07/2008  . CHEST PAIN 01/07/2008  . Type II diabetes mellitus with  manifestations ([place]) 06/24/2007  . Essential hypertension 06/24/2007    Current Outpatient Medications on File Prior to Visit  Medication Sig Dispense Refill  . amLODipine (NORVASC) 5 MG tablet TAKE 1 TABLET (5 MG TOTAL) BY MOUTH DAILY. 90 tablet 3  . aspirin 81 MG tablet Take 81 mg by mouth daily.    . blood glucose meter kit and supplies KIT Dispense based on patient and insurance preference. Use up to four times daily as directed E11.9 1 each 0  . irbesartan (AVAPRO) 300 MG tablet Take 1 tablet (300 mg total) daily by mouth. 90 tablet 3  . metFORMIN (GLUCOPHAGE) 500 MG tablet Take 2 tablets (1,000 mg total) 2 (two) times daily with a meal by mouth. 360 tablet 3  . nebivolol (BYSTOLIC) 5 MG tablet Take 1 tablet (5 mg total) daily by mouth. 90 tablet 3  . ONETOUCH DELICA LANCETS 24M MISC Use to help check blood sugars three times a day Dx E11.9 100 each 11  . pravastatin (PRAVACHOL) 40 MG tablet TAKE 1 TABLET (40 MG TOTAL) BY MOUTH DAILY. 90 tablet 3  . traMADol (ULTRAM) 50 MG tablet Take 1 tablet (50 mg total) by mouth every 8 (eight) hours as needed. 60 tablet 0   No current facility-administered medications on file prior to visit.     Past Medical History:  Diagnosis Date  . ALLERGIC RHINITIS 01/07/2008   Qualifier: Diagnosis of  By: Jenny Reichmann MD, Shelbie Hutching DEFICIENCY 01/07/2008   Qualifier: Diagnosis of  ByJenny Reichmann MD, Hunt Oris   . DIABETES MELLITUS, TYPE II 06/24/2007   Qualifier: Diagnosis of  By: Jenny Reichmann MD, Hunt Oris   . DIVERTICULOSIS, COLON 01/07/2008   Qualifier: Diagnosis of  By: Jenny Reichmann MD, Hunt Oris   . Empty sella ([place]) 03/17/2013  . GERD 05/24/2009   Qualifier: Diagnosis of  By: Jenny Reichmann MD, Hunt Oris   . HYPERLIPIDEMIA 06/01/2008   Qualifier: Diagnosis of  By: Jenny Reichmann MD, Hunt Oris   . HYPERTENSION 06/24/2007   Qualifier: Diagnosis of  By: Jenny Reichmann MD, Hunt Oris   . Lumbar disc disease 10/08/2012   l4-5 per CT 2008  . MALT lymphoma ([place]) 06/08/2012   Gastric, 2001  . PULMONARY NODULE,  RIGHT LOWER LOBE 01/09/2008   Qualifier: Diagnosis of  By: Jenny Reichmann MD, Hunt Oris   . Umbilical hernia 23/55/7322   Fat containing on CT 2008    Past Surgical History:  Procedure Laterality Date  . NO PAST SURGERIES    . UPPER GASTROINTESTINAL ENDOSCOPY      Social History   Socioeconomic History  . Marital status: Married    Spouse name: Not on file  . Number of children: Not on file  . Years of education: Not on file  . Highest education level: Not on file  Occupational History  . Not on file  Social Needs  . Financial resource strain: Not on file  . Food insecurity:    Worry: Not on file    Inability: Not on file  . Transportation needs:    Medical: Not on file    Non-medical: Not on file  Tobacco Use  . Smoking status: Never Smoker  . Smokeless tobacco: Never Used  Substance and Sexual Activity  . Alcohol use: No  . Drug use: No  . Sexual activity: Not on file  Lifestyle  . Physical activity:    Days per week: Not on file    Minutes per session: Not on file  . Stress: Not on file  Relationships  . Social connections:    Talks on phone: Not on file    Gets together: Not on file    Attends religious service: Not on file    Active member of club or organization: Not on file    Attends meetings of clubs or organizations: Not on file    Relationship status: Not on file  Other Topics Concern  . Not on file  Social History Narrative  . Not on file    Family History  Problem Relation Age of Onset  . Hypertension Father   . Colon cancer Neg Hx     Review of Systems  Constitutional: Negative for chills and fever.  HENT: Positive for congestion and sore throat. Negative for ear pain and sinus pain.   Respiratory: Positive for cough (productive). Negative for shortness of breath and wheezing.   Neurological: Negative for headaches.       Objective:   Vitals:   11/05/18 0931  BP: 120/76  Pulse: 83  Resp: 16  Temp: 98.1 F (36.7 C)  SpO2: 96%   BP  Readings from Last 3 Encounters:  11/05/18 120/76  06/05/18 130/84  10/23/17 136/84   Wt Readings from Last 3 Encounters:  11/05/18 186 lb (84.4 kg)  06/05/18 177 lb (80.3 kg)  10/23/17 195 lb (88.5 kg)   Body mass index is 29.13 kg/m.   Physical Exam    GENERAL APPEARANCE: Appears stated age, well appearing, NAD EYES: conjunctiva clear, no icterus  HEENT: bilateral tympanic membranes and ear canals normal, oropharynx with mild erythema, no thyromegaly, trachea midline, no cervical or supraclavicular lymphadenopathy LUNGS: unlabored breathing, good air entry bilaterally, rhonchi left lung, right lung clear without wheeze or crackles CARDIOVASCULAR: Normal S1,S2 without murmurs, no edema SKIN: Warm, dry      Assessment & Plan:    See Problem List for Assessment and Plan of chronic medical problems.

## 2018-11-05 ENCOUNTER — Ambulatory Visit (INDEPENDENT_AMBULATORY_CARE_PROVIDER_SITE_OTHER)
Admission: RE | Admit: 2018-11-05 | Discharge: 2018-11-05 | Disposition: A | Discharge status: Home / Self Care | Payer: Medicare Other | Source: Ambulatory Visit | Attending: Internal Medicine | Admitting: Internal Medicine

## 2018-11-05 ENCOUNTER — Other Ambulatory Visit: Payer: Self-pay | Admitting: Internal Medicine

## 2018-11-05 ENCOUNTER — Ambulatory Visit (INDEPENDENT_AMBULATORY_CARE_PROVIDER_SITE_OTHER): Payer: Medicare Other | Admitting: Internal Medicine

## 2018-11-05 ENCOUNTER — Encounter: Payer: Self-pay | Admitting: Internal Medicine

## 2018-11-05 VITALS — BP 120/76 | HR 83 | Temp 98.1°F | Resp 16 | Ht 67.0 in | Wt 186.0 lb

## 2018-11-05 DIAGNOSIS — R059 Cough, unspecified: Secondary | ICD-10-CM

## 2018-11-05 DIAGNOSIS — R05 Cough: Secondary | ICD-10-CM

## 2018-11-05 DIAGNOSIS — J209 Acute bronchitis, unspecified: Secondary | ICD-10-CM

## 2018-11-05 DIAGNOSIS — J181 Lobar pneumonia, unspecified organism: Principal | ICD-10-CM

## 2018-11-05 DIAGNOSIS — J189 Pneumonia, unspecified organism: Secondary | ICD-10-CM

## 2018-11-05 MED ORDER — DOXYCYCLINE HYCLATE 100 MG PO TABS: 100.0000 mg | ORAL_TABLET | Freq: Two times a day (BID) | ORAL | 0 refills | Status: DC

## 2018-11-05 NOTE — Assessment & Plan Note (Signed)
Symptoms consistent with acute bronchitis-concerning for possible left-sided pneumonia or at least mucus plugging on the left Chest x-ray today to rule out pneumonia Start doxycycline twice daily x10 days Continue over-the-counter cold medications Rest, fluids Call if no improvement

## 2018-11-05 NOTE — Patient Instructions (Addendum)
Have a chest xray downstairs today.  We will call with the results today.    Take the antibiotic as prescribed - complete the entire course.    Continue over the counter cold medication, advil and tylenol.  Increase your fluids and rest.    Call if no improvement

## 2018-11-20 ENCOUNTER — Encounter: Payer: Self-pay | Admitting: Internal Medicine

## 2018-11-20 ENCOUNTER — Other Ambulatory Visit: Payer: Self-pay | Admitting: Internal Medicine

## 2018-11-20 ENCOUNTER — Telehealth: Payer: Self-pay

## 2018-11-20 ENCOUNTER — Ambulatory Visit (INDEPENDENT_AMBULATORY_CARE_PROVIDER_SITE_OTHER): Payer: Medicare Other | Admitting: Internal Medicine

## 2018-11-20 ENCOUNTER — Other Ambulatory Visit (INDEPENDENT_AMBULATORY_CARE_PROVIDER_SITE_OTHER): Payer: Medicare Other

## 2018-11-20 ENCOUNTER — Ambulatory Visit (INDEPENDENT_AMBULATORY_CARE_PROVIDER_SITE_OTHER)
Admission: RE | Admit: 2018-11-20 | Discharge: 2018-11-20 | Disposition: A | Discharge status: Home / Self Care | Payer: Medicare Other | Source: Ambulatory Visit | Attending: Internal Medicine | Admitting: Internal Medicine

## 2018-11-20 VITALS — BP 118/76 | HR 68 | Temp 97.8°F | Ht 67.0 in | Wt 191.0 lb

## 2018-11-20 DIAGNOSIS — R918 Other nonspecific abnormal finding of lung field: Secondary | ICD-10-CM | POA: Diagnosis not present

## 2018-11-20 DIAGNOSIS — J189 Pneumonia, unspecified organism: Secondary | ICD-10-CM

## 2018-11-20 DIAGNOSIS — E118 Type 2 diabetes mellitus with unspecified complications: Secondary | ICD-10-CM

## 2018-11-20 DIAGNOSIS — E785 Hyperlipidemia, unspecified: Secondary | ICD-10-CM

## 2018-11-20 DIAGNOSIS — J181 Lobar pneumonia, unspecified organism: Secondary | ICD-10-CM | POA: Diagnosis not present

## 2018-11-20 DIAGNOSIS — Z23 Encounter for immunization: Secondary | ICD-10-CM | POA: Diagnosis not present

## 2018-11-20 DIAGNOSIS — I10 Essential (primary) hypertension: Secondary | ICD-10-CM

## 2018-11-20 LAB — HEPATIC FUNCTION PANEL
ALK PHOS: 90 U/L (ref 39–117)
ALT: 31 U/L (ref 0–35)
AST: 25 U/L (ref 0–37)
Albumin: 4 g/dL (ref 3.5–5.2)
Bilirubin, Direct: 0.1 mg/dL (ref 0.0–0.3)
TOTAL PROTEIN: 7.1 g/dL (ref 6.0–8.3)
Total Bilirubin: 0.6 mg/dL (ref 0.2–1.2)

## 2018-11-20 LAB — BASIC METABOLIC PANEL
BUN: 17 mg/dL (ref 6–23)
CALCIUM: 9.7 mg/dL (ref 8.4–10.5)
CO2: 29 meq/L (ref 19–32)
Chloride: 102 mEq/L (ref 96–112)
Creatinine, Ser: 0.92 mg/dL (ref 0.40–1.20)
GFR: 77.28 mL/min (ref >60.00)
Glucose, Bld: 186 mg/dL — ABNORMAL HIGH (ref 70–99)
Potassium: 4.1 mEq/L (ref 3.5–5.1)
Sodium: 136 mEq/L (ref 135–145)

## 2018-11-20 LAB — CBC WITH DIFFERENTIAL/PLATELET
Basophils Absolute: 0 10*3/uL (ref 0.0–0.1)
Basophils Relative: 0.3 % (ref 0.0–3.0)
Eosinophils Absolute: 0.4 10*3/uL (ref 0.0–0.7)
Eosinophils Relative: 6.6 % — ABNORMAL HIGH (ref 0.0–5.0)
HCT: 37.9 % (ref 36.0–46.0)
HEMOGLOBIN: 12.6 g/dL (ref 12.0–15.0)
LYMPHS PCT: 46.8 % — AB (ref 12.0–46.0)
Lymphs Abs: 3.1 10*3/uL (ref 0.7–4.0)
MCHC: 33.4 g/dL (ref 30.0–36.0)
MCV: 86.7 fl (ref 78.0–100.0)
Monocytes Absolute: 0.5 10*3/uL (ref 0.1–1.0)
Monocytes Relative: 7.8 % (ref 3.0–12.0)
Neutro Abs: 2.5 10*3/uL (ref 1.4–7.7)
Neutrophils Relative %: 38.5 % — ABNORMAL LOW (ref 43.0–77.0)
Platelets: 229 10*3/uL (ref 150.0–400.0)
RBC: 4.37 Mil/uL (ref 3.87–5.11)
RDW: 13.4 % (ref 11.5–15.5)
WBC: 6.6 10*3/uL (ref 4.0–10.5)

## 2018-11-20 LAB — HEMOGLOBIN A1C: Hgb A1c MFr Bld: 8.4 % — ABNORMAL HIGH (ref 4.6–6.5)

## 2018-11-20 LAB — LIPID PANEL
Cholesterol: 135 mg/dL (ref 0–200)
HDL: 48.9 mg/dL (ref >39.00)
LDL Cholesterol: 70 mg/dL (ref 0–99)
NonHDL: 85.6
Total CHOL/HDL Ratio: 3
Triglycerides: 77 mg/dL (ref 0.0–149.0)
VLDL: 15.4 mg/dL (ref 0.0–40.0)

## 2018-11-20 MED ORDER — EMPAGLIFLOZIN 10 MG PO TABS: 10.0000 mg | ORAL_TABLET | Freq: Every day | ORAL | 3 refills | Status: DC

## 2018-11-20 NOTE — Progress Notes (Signed)
Subjective:    Patient ID: Andrea Anthony, female    DOB: 12/22/1945, 72 y.o.   MRN: [id number]  HPI  Here to f/u; overall doing ok,  Pt denies chest pain, increasing sob or doe, wheezing, orthopnea, PND, increased LE swelling, palpitations, dizziness or syncope.  Pt denies new neurological symptoms such as new headache, or facial or extremity weakness or numbness.  Pt denies polydipsia, polyuria, or low sugar episode.  Pt states overall good compliance with meds, mostly trying to follow appropriate diet, with wt overall stable,  but little exercise however.  Pt denies fever, wt loss, night sweats, loss of appetite, or other constitutional symptoms, except for fatigue and non prod cough posterior LLL CAP tx recently, now finished antibx Past Medical History:  Diagnosis Date  . ALLERGIC RHINITIS 01/07/2008   Qualifier: Diagnosis of  By: Jenny Reichmann MD, Shelbie Hutching DEFICIENCY 01/07/2008   Qualifier: Diagnosis of  By: Jenny Reichmann MD, Hunt Oris   . DIABETES MELLITUS, TYPE II 06/24/2007   Qualifier: Diagnosis of  By: Jenny Reichmann MD, Hunt Oris   . DIVERTICULOSIS, COLON 01/07/2008   Qualifier: Diagnosis of  By: Jenny Reichmann MD, Hunt Oris   . Empty sella ([employer]) 03/17/2013  . GERD 05/24/2009   Qualifier: Diagnosis of  By: Jenny Reichmann MD, Hunt Oris   . HYPERLIPIDEMIA 06/01/2008   Qualifier: Diagnosis of  By: Jenny Reichmann MD, Hunt Oris   . HYPERTENSION 06/24/2007   Qualifier: Diagnosis of  By: Jenny Reichmann MD, Hunt Oris   . Lumbar disc disease 10/08/2012   l4-5 per CT 2008  . MALT lymphoma ([place]) 06/08/2012   Gastric, 2001  . PULMONARY NODULE, RIGHT LOWER LOBE 01/09/2008   Qualifier: Diagnosis of  By: Jenny Reichmann MD, Hunt Oris   . Umbilical hernia 41/93/7902   Fat containing on CT 2008   Past Surgical History:  Procedure Laterality Date  . NO PAST SURGERIES    . UPPER GASTROINTESTINAL ENDOSCOPY      reports that she has never smoked. She has never used smokeless tobacco. She reports that she does not drink alcohol or use drugs. family history includes  Hypertension in her father. Allergies  Allergen Reactions  . Ace Inhibitors     REACTION: cough  . Sulfonamide Derivatives     Not sure of reaction   Current Outpatient Medications on File Prior to Visit  Medication Sig Dispense Refill  . amLODipine (NORVASC) 5 MG tablet TAKE 1 TABLET (5 MG TOTAL) BY MOUTH DAILY. 90 tablet 3  . aspirin 81 MG tablet Take 81 mg by mouth daily.    . blood glucose meter kit and supplies KIT Dispense based on patient and insurance preference. Use up to four times daily as directed E11.9 1 each 0  . doxycycline (VIBRA-TABS) 100 MG tablet Take 1 tablet (100 mg total) by mouth 2 (two) times daily. 20 tablet 0  . irbesartan (AVAPRO) 300 MG tablet Take 1 tablet (300 mg total) daily by mouth. 90 tablet 3  . metFORMIN (GLUCOPHAGE) 500 MG tablet Take 2 tablets (1,000 mg total) 2 (two) times daily with a meal by mouth. 360 tablet 3  . nebivolol (BYSTOLIC) 5 MG tablet Take 1 tablet (5 mg total) daily by mouth. 90 tablet 3  . ONETOUCH DELICA LANCETS 40X MISC Use to help check blood sugars three times a day Dx E11.9 100 each 11  . pravastatin (PRAVACHOL) 40 MG tablet TAKE 1 TABLET (40 MG TOTAL) BY MOUTH DAILY. 90 tablet 3  . traMADol (  ULTRAM) 50 MG tablet Take 1 tablet (50 mg total) by mouth every 8 (eight) hours as needed. 60 tablet 0   No current facility-administered medications on file prior to visit.    Review of Systems  Constitutional: Negative for other unusual diaphoresis or sweats HENT: Negative for ear discharge or swelling Eyes: Negative for other worsening visual disturbances Respiratory: Negative for stridor or other swelling  Gastrointestinal: Negative for worsening distension or other blood Genitourinary: Negative for retention or other urinary change Musculoskeletal: Negative for other MSK pain or swelling Skin: Negative for color change or other new lesions Neurological: Negative for worsening tremors and other numbness  Psychiatric/Behavioral:  Negative for worsening agitation or other fatigue All other system neg per pt    Objective:   Physical Exam BP 118/76   Pulse 68   Temp 97.8 F (36.6 C) (Oral)   Ht _0  (1.702 m)   Wt 191 lb (86.6 kg)   SpO2 96%   BMI 29.91 kg/m  VS noted,  Constitutional: Pt appears in NAD HENT: Head: NCAT.  Right Ear: External ear normal.  Left Ear: External ear normal.  Eyes: . Pupils are equal, round, and reactive to light. Conjunctivae and EOM are normal Nose: without d/c or deformity Neck: Neck supple. Gross normal ROM Cardiovascular: Normal rate and regular rhythm.   Pulmonary/Chest: Effort normal and breath sounds without wheezing.  Abd:  Soft, NT, ND, + BS, no organomegaly Neurological: Pt is alert. At baseline orientation, motor grossly intact Skin: Skin is warm. No rashes, other new lesions, no LE edema Psychiatric: Pt behavior is normal without agitation  Very few LLL crackle Lab Results  Component Value Date   WBC 6.6 11/20/2018   HGB 12.6 11/20/2018   HCT 37.9 11/20/2018   PLT 229.0 11/20/2018   GLUCOSE 186 (H) 11/20/2018   CHOL 135 11/20/2018   TRIG 77.0 11/20/2018   HDL 48.90 11/20/2018   LDLCALC 70 11/20/2018   ALT 31 11/20/2018   AST 25 11/20/2018   NA 136 11/20/2018   K 4.1 11/20/2018   CL 102 11/20/2018   CREATININE 0.92 11/20/2018   BUN 17 11/20/2018   CO2 29 11/20/2018   TSH 1.33 06/05/2018   HGBA1C 8.4 (H) 11/20/2018   MICROALBUR <0.7 06/05/2018       Assessment & Plan:

## 2018-11-20 NOTE — Telephone Encounter (Signed)
-----   Message from Biagio Borg, MD sent at 11/20/2018  1:23 PM EST ----- Also, please ask daughter with her today (pt has limited [place]) to have the pharmacy call with a similar medication if the jardiance is not covered

## 2018-11-20 NOTE — Assessment & Plan Note (Signed)
stable overall by history and exam, recent data reviewed with pt, and pt to continue medical treatment as before,  to f/u any worsening symptoms or concerns  

## 2018-11-20 NOTE — Patient Instructions (Signed)
Please continue all other medications as before, and refills have been done if requested.  Please have the pharmacy call with any other refills you may need.  Please continue your efforts at being more active, low cholesterol diet, and weight control.  Please keep your appointments with your specialists as you may have planned  Please go to the XRAY Department in the Basement (go straight as you get off the elevator) for the x-ray testing  You will be contacted by phone if any changes need to be made immediately.  Otherwise, you will receive a letter about your results with an explanation, but please check with MyChart first.  Please remember to sign up for MyChart if you have not done so, as this will be important to you in the future with finding out test results, communicating by private email, and scheduling acute appointments online when needed.  Please return in 6 months, or sooner if needed, with Lab testing done 3-5 days before

## 2018-11-20 NOTE — Assessment & Plan Note (Signed)
Improved clinically, for f/u cxr today

## 2018-11-27 ENCOUNTER — Ambulatory Visit: Payer: Self-pay

## 2018-11-27 NOTE — Telephone Encounter (Signed)
Patient's daughter Adair Laundry Innocent called and says "my mother received a letter from Dr. Jenny Reichmann about the chest x-ray she received on 11/20/18 that says the pneumonia is better. She had finished up her treatment for pneumonia before she went to the office on 11/20/18. She is not having any symptoms. She's better, but I can tell she's not her usual self, I can tell in her face. Her question to Dr. Jenny Reichmann is will he be giving her more treatment to clear up the pneumonia?" I advised I will send this over for him to review and someone will call back with his recommendation, she verbalized understanding.   Reason for Disposition . [1] Caller requesting NON-URGENT health information AND [2] PCP's office is the best resource  Answer Assessment - Initial Assessment Questions 1. SYMPTOMS: "What symptoms are you most concerned about?" "Is it better, the same, or worse compared to when you saw the doctor?"     Congestion not completely clear 2. BREATHING DIFFICULTY: "Are you having any difficulty breathing?" If so, ask "How bad is it?"  (e.g., none, mild, moderate, severe)   - MILD: No SOB at rest, mild SOB with walking, speaks normally in sentences, can lay down, no retractions, pulse < 100.    - MODERATE: SOB at rest, SOB with minimal exertion and prefers to sit, cannot lie down flat, speaks in phrases, mild retractions, audible wheezing, pulse 100-120.    - SEVERE: Very SOB at rest, speaks in single words, struggling to breathe, sitting hunched forward, retractions, pulse > 120      No SOB 3. FEVER: "Do you have a fever?" If so, ask: "What is your temperature, how was it measured, and when did it start?"     No 4. SPUTUM: "Describe the color of your sputum" (clear, white, yellow, green, blood-tinged)     No 5. DIAGNOSIS CONFIRMATION: "When was the pneumonia diagnosed?" "By whom?"     Doctor 6. ANTIBIOTIC: "Are you taking an antibiotic?"  If so, "Which one?" "When was it started?"     Completed already 7.  OTHER TREATMENT: "Are you receiving any other treatment for the pneumonia?" (e.g., albuterol nebulizer, oxygen) If so, ask, "How often?" and "Do they help?"     No 8. HOSPITAL ADMISSION: "Were you hospitalized for this pneumonia?" If so, ask "When were you discharged home from the hospital?"     No  Protocols used: INFORMATION ONLY CALL-A-AH, PNEUMONIA ON ANTIBIOTIC FOLLOW-UP CALL-A-AH

## 2018-11-27 NOTE — Telephone Encounter (Signed)
Normally this is not needed unless there is fever, cough especially productive, shortness of breath or even chest pain;  Ok to follow as it normally takes over 1 month to get back the stamina at her age after pna

## 2018-11-28 NOTE — Telephone Encounter (Signed)
Pt's daughter has been informed.  

## 2018-11-29 ENCOUNTER — Other Ambulatory Visit: Payer: Self-pay | Admitting: Internal Medicine

## 2018-11-29 DIAGNOSIS — I10 Essential (primary) hypertension: Secondary | ICD-10-CM

## 2018-12-12 DIAGNOSIS — H43393 Other vitreous opacities, bilateral: Secondary | ICD-10-CM | POA: Diagnosis not present

## 2018-12-12 DIAGNOSIS — H04123 Dry eye syndrome of bilateral lacrimal glands: Secondary | ICD-10-CM | POA: Diagnosis not present

## 2018-12-12 DIAGNOSIS — H25813 Combined forms of age-related cataract, bilateral: Secondary | ICD-10-CM | POA: Diagnosis not present

## 2018-12-12 DIAGNOSIS — E119 Type 2 diabetes mellitus without complications: Secondary | ICD-10-CM | POA: Diagnosis not present

## 2018-12-26 ENCOUNTER — Telehealth: Payer: Self-pay | Admitting: Internal Medicine

## 2018-12-26 NOTE — Telephone Encounter (Signed)
Copied from [place] [id number]. Topic: Quick Communication - Rx Refill/Question >> Dec 26, 2018  1:26 PM Alanda Slim E wrote: Medication: amLODipine (NORVASC) 5 MG tablet glipiZIDE (GLUCOTROL XL) 2.5 MG 24 hr tablet  empagliflozin (JARDIANCE) 10 MG TABS tablet  irbesartan (AVAPRO) 300 MG tablet metFORMIN (GLUCOPHAGE) 500 MG tablet  pravastatin (PRAVACHOL) 40 MG tablet   Pt is leaving the country next week and would like these medications refilled for a 3 month supply. Pt will be out of country for over 3 month    Has the patient contacted their pharmacy? No    Preferred Pharmacy (with phone number or street name): [facility] [address] [place], [place] - [address] [phone] (Phone) [phone] (Fax)    Agent: Please be advised that RX refills may take up to 3 business days. We ask that you follow-up with your pharmacy.

## 2018-12-26 NOTE — Telephone Encounter (Signed)
Patient called and spoke to her daughter, Andrea Anthony, about the refill requests. I advised refills were sent in December, 2019 for a 3 month supply and if she is requiring a refill to cover until she comes back, then she will have to pay out of pocket. I advised that if she picked up in December, that it will last until near end of March and she could ask the pharmacy for a 30 day supply to cover until she comes back towards the end of April. I advised to call and speak to the pharmacy, she verbalized understanding.

## 2019-05-27 ENCOUNTER — Ambulatory Visit: Payer: Medicare Other | Admitting: Internal Medicine

## 2019-09-12 ENCOUNTER — Ambulatory Visit (INDEPENDENT_AMBULATORY_CARE_PROVIDER_SITE_OTHER): Payer: Medicare Other | Admitting: Internal Medicine

## 2019-09-12 ENCOUNTER — Other Ambulatory Visit: Payer: Self-pay

## 2019-09-12 ENCOUNTER — Other Ambulatory Visit (INDEPENDENT_AMBULATORY_CARE_PROVIDER_SITE_OTHER): Payer: Medicare Other

## 2019-09-12 ENCOUNTER — Encounter: Payer: Self-pay | Admitting: Internal Medicine

## 2019-09-12 VITALS — BP 122/84 | HR 72 | Temp 98.5°F | Ht 67.0 in | Wt 183.0 lb

## 2019-09-12 DIAGNOSIS — E611 Iron deficiency: Secondary | ICD-10-CM | POA: Diagnosis not present

## 2019-09-12 DIAGNOSIS — Z23 Encounter for immunization: Secondary | ICD-10-CM

## 2019-09-12 DIAGNOSIS — E538 Deficiency of other specified B group vitamins: Secondary | ICD-10-CM

## 2019-09-12 DIAGNOSIS — E559 Vitamin D deficiency, unspecified: Secondary | ICD-10-CM

## 2019-09-12 DIAGNOSIS — E118 Type 2 diabetes mellitus with unspecified complications: Secondary | ICD-10-CM

## 2019-09-12 DIAGNOSIS — Z0000 Encounter for general adult medical examination without abnormal findings: Secondary | ICD-10-CM

## 2019-09-12 DIAGNOSIS — E2839 Other primary ovarian failure: Secondary | ICD-10-CM

## 2019-09-12 LAB — HEPATIC FUNCTION PANEL
ALT: 24 U/L (ref 0–35)
AST: 22 U/L (ref 0–37)
Albumin: 4.3 g/dL (ref 3.5–5.2)
Alkaline Phosphatase: 82 U/L (ref 39–117)
Bilirubin, Direct: 0.2 mg/dL (ref 0.0–0.3)
Total Bilirubin: 0.7 mg/dL (ref 0.2–1.2)
Total Protein: 7.6 g/dL (ref 6.0–8.3)

## 2019-09-12 LAB — BASIC METABOLIC PANEL
BUN: 21 mg/dL (ref 6–23)
CO2: 30 mEq/L (ref 19–32)
Calcium: 10.4 mg/dL (ref 8.4–10.5)
Chloride: 101 mEq/L (ref 96–112)
Creatinine, Ser: 1.08 mg/dL (ref 0.40–1.20)
GFR: 60.29 mL/min (ref >60.00)
Glucose, Bld: 138 mg/dL — ABNORMAL HIGH (ref 70–99)
Potassium: 4.4 mEq/L (ref 3.5–5.1)
Sodium: 138 mEq/L (ref 135–145)

## 2019-09-12 LAB — URINALYSIS, ROUTINE W REFLEX MICROSCOPIC
Bilirubin Urine: NEGATIVE
Hgb urine dipstick: NEGATIVE
Ketones, ur: NEGATIVE
Nitrite: NEGATIVE
RBC / HPF: NONE SEEN (ref 0–?)
Specific Gravity, Urine: 1.015 (ref 1.000–1.030)
Total Protein, Urine: NEGATIVE
Urine Glucose: NEGATIVE
Urobilinogen, UA: 0.2 (ref 0.0–1.0)
pH: 7 (ref 5.0–8.0)

## 2019-09-12 LAB — CBC WITH DIFFERENTIAL/PLATELET
Basophils Absolute: 0.1 10*3/uL (ref 0.0–0.1)
Basophils Relative: 1 % (ref 0.0–3.0)
Eosinophils Absolute: 0.1 10*3/uL (ref 0.0–0.7)
Eosinophils Relative: 1.8 % (ref 0.0–5.0)
HCT: 40.5 % (ref 36.0–46.0)
Hemoglobin: 13.4 g/dL (ref 12.0–15.0)
Lymphocytes Relative: 45.4 % (ref 12.0–46.0)
Lymphs Abs: 3.4 10*3/uL (ref 0.7–4.0)
MCHC: 33.2 g/dL (ref 30.0–36.0)
MCV: 87.5 fl (ref 78.0–100.0)
Monocytes Absolute: 0.5 10*3/uL (ref 0.1–1.0)
Monocytes Relative: 6.4 % (ref 3.0–12.0)
Neutro Abs: 3.4 10*3/uL (ref 1.4–7.7)
Neutrophils Relative %: 45.4 % (ref 43.0–77.0)
Platelets: 232 10*3/uL (ref 150.0–400.0)
RBC: 4.62 Mil/uL (ref 3.87–5.11)
RDW: 13.1 % (ref 11.5–15.5)
WBC: 7.5 10*3/uL (ref 4.0–10.5)

## 2019-09-12 LAB — LIPID PANEL
Cholesterol: 147 mg/dL (ref 0–200)
HDL: 51.4 mg/dL (ref >39.00)
LDL Cholesterol: 66 mg/dL (ref 0–99)
NonHDL: 95.58
Total CHOL/HDL Ratio: 3
Triglycerides: 148 mg/dL (ref 0.0–149.0)
VLDL: 29.6 mg/dL (ref 0.0–40.0)

## 2019-09-12 LAB — MICROALBUMIN / CREATININE URINE RATIO
Creatinine,U: 61.2 mg/dL
Microalb Creat Ratio: 1.1 mg/g (ref 0.0–30.0)
Microalb, Ur: <0.7 mg/dL (ref 0.0–1.9)

## 2019-09-12 LAB — IBC PANEL
Iron: 126 ug/dL (ref 42–145)
Saturation Ratios: 36.6 % (ref 20.0–50.0)
Transferrin: 246 mg/dL (ref 212.0–360.0)

## 2019-09-12 LAB — VITAMIN D 25 HYDROXY (VIT D DEFICIENCY, FRACTURES): VITD: 54 ng/mL (ref 30.00–100.00)

## 2019-09-12 LAB — VITAMIN B12: Vitamin B-12: 728 pg/mL (ref 211–911)

## 2019-09-12 LAB — TSH: TSH: 1.72 u[IU]/mL (ref 0.35–4.50)

## 2019-09-12 LAB — HEMOGLOBIN A1C: Hgb A1c MFr Bld: 7.7 % — ABNORMAL HIGH (ref 4.6–6.5)

## 2019-09-12 MED ORDER — ZOSTER VAC RECOMB ADJUVANTED 50 MCG/0.5ML IM SUSR: 0.5000 mL | Freq: Once | INTRAMUSCULAR | 1 refills | Status: AC

## 2019-09-12 NOTE — Progress Notes (Signed)
Subjective:    Patient ID: Andrea Anthony, female    DOB: [birth year], 73 y.o.   MRN: [id number]  HPI  Here for wellness and f/u with daughter to interpret [place];  Overall doing ok;  Pt denies Chest pain, worsening SOB, DOE, wheezing, orthopnea, PND, worsening LE edema, palpitations, dizziness or syncope.  Pt denies neurological change such as new headache, facial or extremity weakness.  Pt denies polydipsia, polyuria, or low sugar symptoms. Pt states overall good compliance with treatment and medications, good tolerability, and has been trying to follow appropriate diet.  Pt denies worsening depressive symptoms, suicidal ideation or panic. No fever, night sweats, wt loss, loss of appetite, or other constitutional symptoms.  Pt states good ability with ADL's, has low fall risk, home safety reviewed and adequate, no other significant changes in hearing or vision, and only occasionally active with exercise. Past Medical History:  Diagnosis Date  . ALLERGIC RHINITIS 01/07/2008   Qualifier: Diagnosis of  By: Jenny Reichmann MD, Shelbie Hutching DEFICIENCY 01/07/2008   Qualifier: Diagnosis of  By: Jenny Reichmann MD, Hunt Oris   . DIABETES MELLITUS, TYPE II 06/24/2007   Qualifier: Diagnosis of  By: Jenny Reichmann MD, Hunt Oris   . DIVERTICULOSIS, COLON 01/07/2008   Qualifier: Diagnosis of  By: Jenny Reichmann MD, Hunt Oris   . Empty sella ([place]) 03/17/2013  . GERD 05/24/2009   Qualifier: Diagnosis of  By: Jenny Reichmann MD, Hunt Oris   . HYPERLIPIDEMIA 06/01/2008   Qualifier: Diagnosis of  By: Jenny Reichmann MD, Hunt Oris   . HYPERTENSION 06/24/2007   Qualifier: Diagnosis of  By: Jenny Reichmann MD, Hunt Oris   . Lumbar disc disease 10/08/2012   l4-5 per CT 2008  . 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 PULMONARY NODULE, RIGHT LOWER LOBE 01/09/2008   Qualifier: Diagnosis of  By: Jenny Reichmann MD, Hunt Oris   . Umbilical hernia 09/62/8366   Fat containing on CT 2008   Past Surgical History:  Procedure Laterality Date  . NO PAST SURGERIES    . UPPER GASTROINTESTINAL  ENDOSCOPY      reports that she has never smoked. She has never used smokeless tobacco. She reports that she does not drink alcohol or use drugs. family history includes Hypertension in her father. Allergies  Allergen Reactions  . Ace Inhibitors     REACTION: cough  . Sulfonamide Derivatives     Not sure of reaction   Current Outpatient Medications on File Prior to Visit  Medication Sig Dispense Refill  . amLODipine (NORVASC) 5 MG tablet TAKE 1 TABLET BY MOUTH DAILY 90 tablet 2  . aspirin 81 MG tablet Take 81 mg by mouth daily.    . blood glucose meter kit and supplies KIT Dispense based on patient and insurance preference. Use up to four times daily as directed E11.9 1 each 0  . BYSTOLIC 5 MG tablet TAKE 1 TABLET BY MOUTH DAILY 90 tablet 2  . doxycycline (VIBRA-TABS) 100 MG tablet Take 1 tablet (100 mg total) by mouth 2 (two) times daily. 20 tablet 0  . glipiZIDE (GLUCOTROL XL) 2.5 MG 24 hr tablet TAKE 1 TABLET BY MOUTH DAILY WITH BREAKFAST 90 tablet 2  . irbesartan (AVAPRO) 300 MG tablet TAKE 1 TABLET BY MOUTH DAILY 90 tablet 2  . metFORMIN (GLUCOPHAGE) 500 MG tablet TAKE 2 TABLETS BY MOUTH TWO TIMES DAILY WITH A MEAL 360 tablet 2  . ONETOUCH DELICA LANCETS 29U MISC Use to help check blood sugars three times  a day Dx E11.9 100 each 11  . pravastatin (PRAVACHOL) 40 MG tablet TAKE 1 TABLET BY MOUTH DAILY 90 tablet 2  . traMADol (ULTRAM) 50 MG tablet Take 1 tablet (50 mg total) by mouth every 8 (eight) hours as needed. 60 tablet 0   No current facility-administered medications on file prior to visit.    Review of Systems  Constitutional: Negative for other unusual diaphoresis or sweats HENT: Negative for ear discharge or swelling Eyes: Negative for other worsening visual disturbances Respiratory: Negative for stridor or other swelling  Gastrointestinal: Negative for worsening distension or other blood Genitourinary: Negative for retention or other urinary change Musculoskeletal:  Negative for other MSK pain or swelling Skin: Negative for color change or other new lesions Neurological: Negative for worsening tremors and other numbness  Psychiatric/Behavioral: Negative for worsening agitation or other fatigue All otherwise neg per pt     Objective:   Physical Exam BP 122/84   Pulse 72   Temp 98.5 F (36.9 C) (Oral)   Ht '5\' 7"'  (1.702 m)   Wt 183 lb (83 kg)   SpO2 97%   BMI 28.66 kg/m  VS noted,  Constitutional: Pt is oriented to person, place, and time. Appears well-developed and well-nourished, in no significant distress and comfortable Head: Normocephalic and atraumatic  Eyes: Conjunctivae and EOM are normal. Pupils are equal, round, and reactive to light Right Ear: External ear normal without discharge Left Ear: External ear normal without discharge Nose: Nose without discharge or deformity Mouth/Throat: Oropharynx is without other ulcerations and moist  Neck: Normal range of motion. Neck supple. No JVD present. No tracheal deviation present or significant neck LA or mass Cardiovascular: Normal rate, regular rhythm, normal heart sounds and intact distal pulses.   Pulmonary/Chest: WOB normal and breath sounds without rales or wheezing  Abdominal: Soft. Bowel sounds are normal. NT. No HSM  Musculoskeletal: Normal range of motion. Exhibits no edema Lymphadenopathy: Has no other cervical adenopathy.  Neurological: Pt is alert and oriented to person, place, and time. Pt has normal reflexes. No cranial nerve deficit. Motor grossly intact, Gait intact Skin: Skin is warm and dry. No rash noted or new ulcerations Psychiatric:  Has normal mood and affect. Behavior is normal without agitation All otherwise neg per pt Lab Results  Component Value Date   WBC 7.5 09/12/2019   HGB 13.4 09/12/2019   HCT 40.5 09/12/2019   PLT 232.0 09/12/2019   GLUCOSE 138 (H) 09/12/2019   CHOL 147 09/12/2019   TRIG 148.0 09/12/2019   HDL 51.40 09/12/2019   LDLCALC 66 09/12/2019    ALT 24 09/12/2019   AST 22 09/12/2019   NA 138 09/12/2019   K 4.4 09/12/2019   CL 101 09/12/2019   CREATININE 1.08 09/12/2019   BUN 21 09/12/2019   CO2 30 09/12/2019   TSH 1.72 09/12/2019   HGBA1C 7.7 (H) 09/12/2019   MICROALBUR <0.7 09/12/2019      Assessment & Plan:

## 2019-09-12 NOTE — Patient Instructions (Addendum)
You had the flu shot today, and the Tdap tetanus shot today  Your shingles shot prescription was sent to the pharmacy  You will be contacted regarding the referral for: mammogram  Please continue all other medications as before, and refills have been done if requested.  Please have the pharmacy call with any other refills you may need.  Please continue your efforts at being more active, low cholesterol diet, and weight control.  You are otherwise up to date with prevention measures today.  Please keep your appointments with your specialists as you may have planned  Please go to the LAB in the Basement (turn left off the elevator) for the tests to be done today  You will be contacted by phone if any changes need to be made immediately.  Otherwise, you will receive a letter about your results with an explanation, but please check with MyChart first.  Please remember to sign up for MyChart if you have not done so, as this will be important to you in the future with finding out test results, communicating by private email, and scheduling acute appointments online when needed.  Please return in 6 months, or sooner if needed, with Lab testing done 3-5 days before

## 2019-09-13 ENCOUNTER — Encounter: Payer: Self-pay | Admitting: Internal Medicine

## 2019-09-13 ENCOUNTER — Other Ambulatory Visit: Payer: Self-pay | Admitting: Internal Medicine

## 2019-09-13 MED ORDER — JARDIANCE 25 MG PO TABS: 25.0000 mg | ORAL_TABLET | Freq: Every day | ORAL | 3 refills | Status: DC

## 2019-09-13 NOTE — Assessment & Plan Note (Signed)
stable overall by history and exam, recent data reviewed with pt, and pt to continue medical treatment as before,  to f/u any worsening symptoms or concerns  

## 2019-09-13 NOTE — Assessment & Plan Note (Signed)

## 2019-10-02 ENCOUNTER — Other Ambulatory Visit: Payer: Self-pay | Admitting: Internal Medicine

## 2019-10-02 DIAGNOSIS — Z1231 Encounter for screening mammogram for malignant neoplasm of breast: Secondary | ICD-10-CM

## 2019-10-07 ENCOUNTER — Other Ambulatory Visit: Payer: Self-pay | Admitting: Internal Medicine

## 2019-10-07 DIAGNOSIS — I10 Essential (primary) hypertension: Secondary | ICD-10-CM

## 2019-11-19 ENCOUNTER — Ambulatory Visit (INDEPENDENT_AMBULATORY_CARE_PROVIDER_SITE_OTHER): Payer: Medicare Other | Admitting: Internal Medicine

## 2019-11-19 ENCOUNTER — Encounter: Payer: Self-pay | Admitting: Internal Medicine

## 2019-11-19 DIAGNOSIS — H6091 Unspecified otitis externa, right ear: Secondary | ICD-10-CM | POA: Diagnosis not present

## 2019-11-19 DIAGNOSIS — J309 Allergic rhinitis, unspecified: Secondary | ICD-10-CM | POA: Diagnosis not present

## 2019-11-19 DIAGNOSIS — E118 Type 2 diabetes mellitus with unspecified complications: Secondary | ICD-10-CM | POA: Diagnosis not present

## 2019-11-19 MED ORDER — NEOMYCIN-POLYMYXIN-HC 3.5-10000-1 OT SOLN: 4.0000 [drp] | Freq: Four times a day (QID) | OTIC | 0 refills | Status: AC

## 2019-11-19 MED ORDER — CIPROFLOXACIN HCL 500 MG PO TABS: 500.0000 mg | ORAL_TABLET | Freq: Two times a day (BID) | ORAL | 0 refills | Status: AC

## 2019-11-19 NOTE — Assessment & Plan Note (Signed)
stable overall by history and exam, recent data reviewed with pt, and pt to continue medical treatment as before,  to f/u any worsening symptoms or concerns  

## 2019-11-19 NOTE — Progress Notes (Signed)
Patient ID: Andrea Anthony, female   DOB: [birth year], 73 y.o.   MRN: [id number]  Virtual Visit via Video Note  I connected with Andrea Anthony on 11/19/19 at 11:00 AM EST by a video enabled telemedicine application and verified that I am speaking with the correct person using two identifiers.  Location: Patient: at home Provider: at office   I discussed the limitations of evaluation and management by telemedicine and the availability of in person appointments. The patient expressed understanding and agreed to proceed.  History of Present Illness: Here with 3 days onset pain, swelling and bloody discharge without trauma, and denies fever, HA, ST, cough, congestion of any kind, and Pt denies chest pain, increased sob or doe, wheezing, orthopnea, PND, increased LE swelling, palpitations, dizziness or syncope.  Pt denies new neurological symptoms such as new headache, or facial or extremity weakness or numbness   Pt denies polydipsia, polyuria Past Medical History:  Diagnosis Date  . ALLERGIC RHINITIS 01/07/2008   Qualifier: Diagnosis of  By: Jenny Reichmann MD, Shelbie Hutching DEFICIENCY 01/07/2008   Qualifier: Diagnosis of  By: Jenny Reichmann MD, Hunt Oris   . DIABETES MELLITUS, TYPE II 06/24/2007   Qualifier: Diagnosis of  By: Jenny Reichmann MD, Hunt Oris   . DIVERTICULOSIS, COLON 01/07/2008   Qualifier: Diagnosis of  By: Jenny Reichmann MD, Hunt Oris   . Empty sella ([place]) 03/17/2013  . GERD 05/24/2009   Qualifier: Diagnosis of  By: Jenny Reichmann MD, Hunt Oris   . HYPERLIPIDEMIA 06/01/2008   Qualifier: Diagnosis of  By: Jenny Reichmann MD, Hunt Oris   . HYPERTENSION 06/24/2007   Qualifier: Diagnosis of  By: Jenny Reichmann MD, Hunt Oris   . Lumbar disc disease 10/08/2012   l4-5 per CT 2008  . 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 PULMONARY NODULE, RIGHT LOWER LOBE 01/09/2008   Qualifier: Diagnosis of  By: Jenny Reichmann MD, Hunt Oris   . Umbilical hernia 18/29/9371   Fat containing on CT 2008   Past Surgical History:  Procedure Laterality Date  . NO PAST  SURGERIES    . UPPER GASTROINTESTINAL ENDOSCOPY      reports that she has never smoked. She has never used smokeless tobacco. She reports that she does not drink alcohol or use drugs. family history includes Hypertension in her father. Allergies  Allergen Reactions  . Ace Inhibitors     REACTION: cough  . Sulfonamide Derivatives     Not sure of reaction   Current Outpatient Medications on File Prior to Visit  Medication Sig Dispense Refill  . amLODipine (NORVASC) 5 MG tablet TAKE 1 TABLET BY MOUTH DAILY 90 tablet 1  . aspirin 81 MG tablet Take 81 mg by mouth daily.    . blood glucose meter kit and supplies KIT Dispense based on patient and insurance preference. Use up to four times daily as directed E11.9 1 each 0  . BYSTOLIC 5 MG tablet TAKE 1 TABLET BY MOUTH DAILY 90 tablet 1  . doxycycline (VIBRA-TABS) 100 MG tablet Take 1 tablet (100 mg total) by mouth 2 (two) times daily. 20 tablet 0  . empagliflozin (JARDIANCE) 25 MG TABS tablet Take 25 mg by mouth daily before breakfast. 90 tablet 3  . glipiZIDE (GLUCOTROL XL) 2.5 MG 24 hr tablet TAKE 1 TABLET BY MOUTH DAILY WITH BREAKFAST 90 tablet 1  . irbesartan (AVAPRO) 300 MG tablet TAKE 1 TABLET BY MOUTH DAILY 90 tablet 1  . metFORMIN (GLUCOPHAGE) 500 MG tablet TAKE 2 TABLETS BY  MOUTH 2 TIMES DAILY WITH MEAL 360 tablet 1  . ONETOUCH DELICA LANCETS 61Q MISC Use to help check blood sugars three times a day Dx E11.9 100 each 11  . pravastatin (PRAVACHOL) 40 MG tablet TAKE 1 TABLET BY MOUTH DAILY 90 tablet 1  . traMADol (ULTRAM) 50 MG tablet Take 1 tablet (50 mg total) by mouth every 8 (eight) hours as needed. 60 tablet 0   No current facility-administered medications on file prior to visit.      Observations/Objective: Alert, NAD, appropriate mood and affect, resps normal, cn 2-12 intact, moves all 4s, no visible rash or swelling Lab Results  Component Value Date   WBC 7.5 09/12/2019   HGB 13.4 09/12/2019   HCT 40.5 09/12/2019   PLT  232.0 09/12/2019   GLUCOSE 138 (H) 09/12/2019   CHOL 147 09/12/2019   TRIG 148.0 09/12/2019   HDL 51.40 09/12/2019   LDLCALC 66 09/12/2019   ALT 24 09/12/2019   AST 22 09/12/2019   NA 138 09/12/2019   K 4.4 09/12/2019   CL 101 09/12/2019   CREATININE 1.08 09/12/2019   BUN 21 09/12/2019   CO2 30 09/12/2019   TSH 1.72 09/12/2019   HGBA1C 7.7 (H) 09/12/2019   MICROALBUR <0.7 09/12/2019   Assessment and Plan: See notes  Follow Up Instructions: See notes   I discussed the assessment and treatment plan with the patient. The patient was provided an opportunity to ask questions and all were answered. The patient agreed with the plan and demonstrated an understanding of the instructions.   The patient was advised to call back or seek an in-person evaluation if the symptoms worsen or if the condition fails to improve as anticipated.   Cathlean Cower, MD

## 2019-11-19 NOTE — Assessment & Plan Note (Signed)
Mild to mod, for antibx course oral and drops,  to f/u any worsening symptoms or concerns

## 2019-11-19 NOTE — Patient Instructions (Signed)
Please take all new medication as prescribed - the ear drops and the pill antibiotics  Please continue all other medications as before, and refills have been done if requested.  Please have the pharmacy call with any other refills you may need.  Please continue your efforts at being more active, low cholesterol diet, and weight control.  Please keep your appointments with your specialists as you may have planned

## 2019-11-24 ENCOUNTER — Other Ambulatory Visit: Payer: Self-pay

## 2019-11-24 ENCOUNTER — Ambulatory Visit
Admission: RE | Admit: 2019-11-24 | Discharge: 2019-11-24 | Disposition: A | Discharge status: Home / Self Care | Payer: Medicare Other | Source: Ambulatory Visit | Attending: Internal Medicine | Admitting: Internal Medicine

## 2019-11-24 DIAGNOSIS — Z1231 Encounter for screening mammogram for malignant neoplasm of breast: Secondary | ICD-10-CM | POA: Diagnosis not present

## 2019-11-26 ENCOUNTER — Other Ambulatory Visit: Payer: Self-pay | Admitting: Internal Medicine

## 2019-11-26 DIAGNOSIS — R928 Other abnormal and inconclusive findings on diagnostic imaging of breast: Secondary | ICD-10-CM

## 2019-11-28 ENCOUNTER — Other Ambulatory Visit: Payer: Self-pay

## 2019-11-28 ENCOUNTER — Other Ambulatory Visit: Payer: Self-pay | Admitting: Internal Medicine

## 2019-11-28 ENCOUNTER — Ambulatory Visit
Admission: RE | Admit: 2019-11-28 | Discharge: 2019-11-28 | Disposition: A | Discharge status: Home / Self Care | Payer: Medicare Other | Source: Ambulatory Visit | Attending: Internal Medicine | Admitting: Internal Medicine

## 2019-11-28 DIAGNOSIS — N6001 Solitary cyst of right breast: Secondary | ICD-10-CM | POA: Diagnosis not present

## 2019-11-28 DIAGNOSIS — N6311 Unspecified lump in the right breast, upper outer quadrant: Secondary | ICD-10-CM | POA: Diagnosis not present

## 2019-11-28 DIAGNOSIS — R928 Other abnormal and inconclusive findings on diagnostic imaging of breast: Secondary | ICD-10-CM

## 2019-11-28 DIAGNOSIS — N6310 Unspecified lump in the right breast, unspecified quadrant: Secondary | ICD-10-CM

## 2020-03-17 ENCOUNTER — Ambulatory Visit: Payer: Medicare Other | Admitting: Internal Medicine

## 2020-03-17 DIAGNOSIS — Z0289 Encounter for other administrative examinations: Secondary | ICD-10-CM

## 2020-04-16 ENCOUNTER — Other Ambulatory Visit: Payer: Self-pay | Admitting: Internal Medicine

## 2020-04-16 DIAGNOSIS — I10 Essential (primary) hypertension: Secondary | ICD-10-CM

## 2020-04-22 ENCOUNTER — Encounter: Payer: Self-pay | Admitting: Internal Medicine

## 2020-04-22 ENCOUNTER — Other Ambulatory Visit: Payer: Self-pay

## 2020-04-22 ENCOUNTER — Other Ambulatory Visit: Payer: Self-pay | Admitting: Internal Medicine

## 2020-04-22 ENCOUNTER — Ambulatory Visit (INDEPENDENT_AMBULATORY_CARE_PROVIDER_SITE_OTHER): Payer: Medicare HMO | Admitting: Internal Medicine

## 2020-04-22 VITALS — BP 120/76 | HR 55 | Temp 98.2°F | Ht 67.0 in | Wt 184.0 lb

## 2020-04-22 DIAGNOSIS — E785 Hyperlipidemia, unspecified: Secondary | ICD-10-CM | POA: Diagnosis not present

## 2020-04-22 DIAGNOSIS — M25562 Pain in left knee: Secondary | ICD-10-CM | POA: Diagnosis not present

## 2020-04-22 DIAGNOSIS — E118 Type 2 diabetes mellitus with unspecified complications: Secondary | ICD-10-CM | POA: Diagnosis not present

## 2020-04-22 DIAGNOSIS — Z0000 Encounter for general adult medical examination without abnormal findings: Secondary | ICD-10-CM

## 2020-04-22 DIAGNOSIS — E559 Vitamin D deficiency, unspecified: Secondary | ICD-10-CM | POA: Diagnosis not present

## 2020-04-22 DIAGNOSIS — Z0001 Encounter for general adult medical examination with abnormal findings: Secondary | ICD-10-CM

## 2020-04-22 DIAGNOSIS — I10 Essential (primary) hypertension: Secondary | ICD-10-CM

## 2020-04-22 DIAGNOSIS — E538 Deficiency of other specified B group vitamins: Secondary | ICD-10-CM

## 2020-04-22 LAB — CBC WITH DIFFERENTIAL/PLATELET
Basophils Absolute: 0 10*3/uL (ref 0.0–0.1)
Basophils Relative: 0.5 % (ref 0.0–3.0)
Eosinophils Absolute: 0.2 10*3/uL (ref 0.0–0.7)
Eosinophils Relative: 2 % (ref 0.0–5.0)
HCT: 42 % (ref 36.0–46.0)
Hemoglobin: 13.8 g/dL (ref 12.0–15.0)
Lymphocytes Relative: 43.5 % (ref 12.0–46.0)
Lymphs Abs: 3.6 10*3/uL (ref 0.7–4.0)
MCHC: 32.9 g/dL (ref 30.0–36.0)
MCV: 88 fl (ref 78.0–100.0)
Monocytes Absolute: 0.6 10*3/uL (ref 0.1–1.0)
Monocytes Relative: 6.9 % (ref 3.0–12.0)
Neutro Abs: 3.9 10*3/uL (ref 1.4–7.7)
Neutrophils Relative %: 47.1 % (ref 43.0–77.0)
Platelets: 224 10*3/uL (ref 150.0–400.0)
RBC: 4.77 Mil/uL (ref 3.87–5.11)
RDW: 13.2 % (ref 11.5–15.5)
WBC: 8.3 10*3/uL (ref 4.0–10.5)

## 2020-04-22 LAB — BASIC METABOLIC PANEL
BUN: 20 mg/dL (ref 6–23)
CO2: 27 mEq/L (ref 19–32)
Calcium: 10 mg/dL (ref 8.4–10.5)
Chloride: 101 mEq/L (ref 96–112)
Creatinine, Ser: 1.01 mg/dL (ref 0.40–1.20)
GFR: 65.03 mL/min (ref >60.00)
Glucose, Bld: 136 mg/dL — ABNORMAL HIGH (ref 70–99)
Potassium: 4.8 mEq/L (ref 3.5–5.1)
Sodium: 135 mEq/L (ref 135–145)

## 2020-04-22 LAB — TSH: TSH: 1.36 u[IU]/mL (ref 0.35–4.50)

## 2020-04-22 LAB — URINALYSIS, ROUTINE W REFLEX MICROSCOPIC
Bilirubin Urine: NEGATIVE
Hgb urine dipstick: NEGATIVE
Ketones, ur: NEGATIVE
Leukocytes,Ua: NEGATIVE
Nitrite: NEGATIVE
Specific Gravity, Urine: 1.015 (ref 1.000–1.030)
Total Protein, Urine: NEGATIVE
Urine Glucose: >=1000 — AB
Urobilinogen, UA: 0.2 (ref 0.0–1.0)
pH: 5.5 (ref 5.0–8.0)

## 2020-04-22 LAB — HEPATIC FUNCTION PANEL
ALT: 24 U/L (ref 0–35)
AST: 21 U/L (ref 0–37)
Albumin: 4.5 g/dL (ref 3.5–5.2)
Alkaline Phosphatase: 105 U/L (ref 39–117)
Bilirubin, Direct: 0.1 mg/dL (ref 0.0–0.3)
Total Bilirubin: 0.5 mg/dL (ref 0.2–1.2)
Total Protein: 7.8 g/dL (ref 6.0–8.3)

## 2020-04-22 LAB — LIPID PANEL
Cholesterol: 153 mg/dL (ref 0–200)
HDL: 47.2 mg/dL (ref >39.00)
LDL Cholesterol: 68 mg/dL (ref 0–99)
NonHDL: 105.34
Total CHOL/HDL Ratio: 3
Triglycerides: 185 mg/dL — ABNORMAL HIGH (ref 0.0–149.0)
VLDL: 37 mg/dL (ref 0.0–40.0)

## 2020-04-22 LAB — HEMOGLOBIN A1C: Hgb A1c MFr Bld: 8 % — ABNORMAL HIGH (ref 4.6–6.5)

## 2020-04-22 LAB — MICROALBUMIN / CREATININE URINE RATIO
Creatinine,U: 47.8 mg/dL
Microalb Creat Ratio: 1.5 mg/g (ref 0.0–30.0)
Microalb, Ur: <0.7 mg/dL (ref 0.0–1.9)

## 2020-04-22 LAB — VITAMIN B12: Vitamin B-12: 547 pg/mL (ref 211–911)

## 2020-04-22 LAB — VITAMIN D 25 HYDROXY (VIT D DEFICIENCY, FRACTURES): VITD: 61.58 ng/mL (ref 30.00–100.00)

## 2020-04-22 MED ORDER — MELOXICAM 7.5 MG PO TABS: 7.5000 mg | ORAL_TABLET | Freq: Every day | ORAL | 1 refills | Status: DC

## 2020-04-22 MED ORDER — TIZANIDINE HCL 2 MG PO TABS: 2.0000 mg | ORAL_TABLET | Freq: Four times a day (QID) | ORAL | 1 refills | Status: DC | PRN

## 2020-04-22 MED ORDER — GLIPIZIDE ER 5 MG PO TB24: 5.0000 mg | ORAL_TABLET | Freq: Every day | ORAL | 3 refills | Status: DC

## 2020-04-22 NOTE — Patient Instructions (Signed)
Please take all new medication as prescribed - the anti-inflammatory and muscle relaxer for pain as needed  Please consider seeing Sports Medicine on the first floor here if the pain persists  Please continue all other medications as before, and refills have been done if requested.  Please have the pharmacy call with any other refills you may need.  Please continue your efforts at being more active, low cholesterol diet, and weight control.  You are otherwise up to date with prevention measures today.  Please keep your appointments with your specialists as you may have planned  Please go to the LAB at the blood drawing area for the tests to be done  You will be contacted by phone if any changes need to be made immediately.  Otherwise, you will receive a letter about your results with an explanation, but please check with MyChart first.  Please remember to sign up for MyChart if you have not done so, as this will be important to you in the future with finding out test results, communicating by private email, and scheduling acute appointments online when needed.  Please make an Appointment to return in 6 months, or sooner if needed

## 2020-04-22 NOTE — Progress Notes (Signed)
Subjective:    Patient ID: Andrea Anthony, female    DOB: 23-Dec-1945, 74 y.o.   MRN: [id number]  HPI  Here for wellness and f/u;  Overall doing ok;  Pt denies Chest pain, worsening SOB, DOE, wheezing, orthopnea, PND, worsening LE edema, palpitations, dizziness or syncope.  Pt denies neurological change such as new headache, facial or extremity weakness.  Pt denies polydipsia, polyuria, or low sugar symptoms. Pt states overall good compliance with treatment and medications, good tolerability, and has been trying to follow appropriate diet.  Pt denies worsening depressive symptoms, suicidal ideation or panic. No fever, night sweats, wt loss, loss of appetite, or other constitutional symptoms.  Pt states good ability with ADL's, has low fall risk, home safety reviewed and adequate, no other significant changes in hearing or vision, and only occasionally active with exercise. Lost wt recently with better diet Wt Readings from Last 3 Encounters:  04/22/20 184 lb (83.5 kg)  09/12/19 183 lb (83 kg)  11/20/18 191 lb (86.6 kg)  Walks much at work, now with left knee pain and swelling, right deltoid area pain, and bilateral ischial tuberosity area pain.   Past Medical History:  Diagnosis Date  . ALLERGIC RHINITIS 01/07/2008   Qualifier: Diagnosis of  By: Jenny Reichmann MD, Shelbie Hutching DEFICIENCY 01/07/2008   Qualifier: Diagnosis of  By: Jenny Reichmann MD, Hunt Oris   . DIABETES MELLITUS, TYPE II 06/24/2007   Qualifier: Diagnosis of  By: Jenny Reichmann MD, Hunt Oris   . DIVERTICULOSIS, COLON 01/07/2008   Qualifier: Diagnosis of  By: Jenny Reichmann MD, Hunt Oris   . Empty sella ([place]) 03/17/2013  . GERD 05/24/2009   Qualifier: Diagnosis of  By: Jenny Reichmann MD, Hunt Oris   . HYPERLIPIDEMIA 06/01/2008   Qualifier: Diagnosis of  By: Jenny Reichmann MD, Hunt Oris   . HYPERTENSION 06/24/2007   Qualifier: Diagnosis of  By: Jenny Reichmann MD, Hunt Oris   . Lumbar disc disease 10/08/2012   l4-5 per CT 2008  . MALT lymphoma ([place]) 06/08/2012   Gastric, 2001  . PULMONARY NODULE,  RIGHT LOWER LOBE 01/09/2008   Qualifier: Diagnosis of  By: Jenny Reichmann MD, Hunt Oris   . Umbilical hernia 97/01/6377   Fat containing on CT 2008   Past Surgical History:  Procedure Laterality Date  . NO PAST SURGERIES    . UPPER GASTROINTESTINAL ENDOSCOPY      reports that she has never smoked. She has never used smokeless tobacco. She reports that she does not drink alcohol or use drugs. family history includes Hypertension in her father. Allergies  Allergen Reactions  . Ace Inhibitors     REACTION: cough  . Sulfonamide Derivatives     Not sure of reaction   Current Outpatient Medications on File Prior to Visit  Medication Sig Dispense Refill  . amLODipine (NORVASC) 5 MG tablet TAKE ONE TABLET BY MOUTH DAILY Annual appt due in Oct must see provider for future refills 90 tablet 1  . aspirin 81 MG tablet Take 81 mg by mouth daily.    . blood glucose meter kit and supplies KIT Dispense based on patient and insurance preference. Use up to four times daily as directed E11.9 1 each 0  . empagliflozin (JARDIANCE) 25 MG TABS tablet Take 25 mg by mouth daily before breakfast. 90 tablet 3  . irbesartan (AVAPRO) 300 MG tablet Take 1 tablet (300 mg total) by mouth daily. Annual appt due in Oct must see provider for future refills 90 tablet 1  .  metFORMIN (GLUCOPHAGE) 500 MG tablet Take 2 tablets (1,000 mg total) by mouth 2 (two) times daily with a meal. Annual appt due in Oct must see provider for future refills 360 tablet 1  . nebivolol (BYSTOLIC) 5 MG tablet Take 1 tablet (5 mg total) by mouth daily. Annual appt due in Oct must see provider for future refills 90 tablet 1  . ONETOUCH DELICA LANCETS 30Q MISC Use to help check blood sugars three times a day Dx E11.9 100 each 11  . pravastatin (PRAVACHOL) 40 MG tablet TAKE ONE TABLET BY MOUTH DAILY  Annual appt due in Oct must see provider for future refills 90 tablet 1  . traMADol (ULTRAM) 50 MG tablet Take 1 tablet (50 mg total) by mouth every 8 (eight)  hours as needed. 60 tablet 0   No current facility-administered medications on file prior to visit.   Review of Systems All otherwise neg per pt     Objective:   Physical Exam BP 120/76 (BP Location: Left Arm, Patient Position: Sitting, Cuff Size: Large)   Pulse (!) 55   Temp 98.2 F (36.8 C) (Oral)   Ht '5\' 7"'  (1.702 m)   Wt 184 lb (83.5 kg)   SpO2 99%   BMI 28.82 kg/m  VS noted,  Constitutional: Pt appears in NAD HENT: Head: NCAT.  Right Ear: External ear normal.  Left Ear: External ear normal.  Eyes: . Pupils are equal, round, and reactive to light. Conjunctivae and EOM are normal Nose: without d/c or deformity Neck: Neck supple. Gross normal ROM Cardiovascular: Normal rate and regular rhythm.   Pulmonary/Chest: Effort normal and breath sounds without rales or wheezing.  Abd:  Soft, NT, ND, + BS, no organomegaly Left knee with 1+ effusion Neurological: Pt is alert. At baseline orientation, motor grossly intact Skin: Skin is warm. No rashes, other new lesions, no LE edema Psychiatric: Pt behavior is normal without agitation  All otherwise neg per pt Lab Results  Component Value Date   WBC 8.3 04/22/2020   HGB 13.8 04/22/2020   HCT 42.0 04/22/2020   PLT 224.0 04/22/2020   GLUCOSE 136 (H) 04/22/2020   CHOL 153 04/22/2020   TRIG 185.0 (H) 04/22/2020   HDL 47.20 04/22/2020   LDLCALC 68 04/22/2020   ALT 24 04/22/2020   AST 21 04/22/2020   NA 135 04/22/2020   K 4.8 04/22/2020   CL 101 04/22/2020   CREATININE 1.01 04/22/2020   BUN 20 04/22/2020   CO2 27 04/22/2020   TSH 1.36 04/22/2020   HGBA1C 8.0 (H) 04/22/2020   MICROALBUR <0.7 04/22/2020      Assessment & Plan:

## 2020-04-24 ENCOUNTER — Encounter: Payer: Self-pay | Admitting: Internal Medicine

## 2020-04-24 DIAGNOSIS — M25562 Pain in left knee: Secondary | ICD-10-CM | POA: Insufficient documentation

## 2020-04-24 NOTE — Assessment & Plan Note (Addendum)
Most likely djd related, for sport med referral, nsaid prn, tizanidine prn  I spent 31 minutes in addition to time for CPX wellness examination in preparing to see the patient by review of recent labs, imaging and procedures, obtaining and reviewing separately obtained history, communicating with the patient and family or caregiver, ordering medications, tests or procedures, and documenting clinical information in the EHR including the differential Dx, treatment, and any further evaluation and other management of left knee pain and swelling, dm, htn, hld

## 2020-04-24 NOTE — Assessment & Plan Note (Signed)
stable overall by history and exam, recent data reviewed with pt, and pt to continue medical treatment as before,  to f/u any worsening symptoms or concerns  

## 2020-04-24 NOTE — Assessment & Plan Note (Signed)

## 2020-04-26 NOTE — Progress Notes (Signed)
This encounter was created in error - please disregard.

## 2020-04-28 ENCOUNTER — Ambulatory Visit (INDEPENDENT_AMBULATORY_CARE_PROVIDER_SITE_OTHER): Payer: Medicare HMO

## 2020-04-28 VITALS — BP 130/83 | HR 65

## 2020-04-28 DIAGNOSIS — Z0000 Encounter for general adult medical examination without abnormal findings: Secondary | ICD-10-CM

## 2020-04-28 NOTE — Progress Notes (Signed)
I connected with Andrea Anthony today by telephone and verified that I am speaking with the correct person using two identifiers. Location patient: home Location provider: work Persons participating in the virtual visit: patient, daughter (per DPR) and Sheral Flow, LPN ([place]).  I discussed the limitations, risks, security and privacy concerns of performing an evaluation and management service by telephone and the availability of in person appointments. I also discussed with the patient that there may be a patient responsible charge related to this service. The patient expressed understanding and verbally consented to this telephonic visit.    Interactive audio and video telecommunications were attempted between this provider and patient, however failed, due to patient having technical difficulties OR patient did not have access to video capability.  We continued and completed visit with audio only.  Some vital signs may be absent or patient reported.   Time Spent with patient on telephone encounter: 45 minutes  Subjective:   Andrea Anthony is a 74 y.o. female who presents for Medicare Annual (Subsequent) preventive examination.  Review of Systems:  No ROS. Medicare Wellness Virtual Visit. Additional risk factors are reflected in social history. Cardiac Risk Factors include: advanced age (>33mn, >>59women);diabetes mellitus;dyslipidemia;hypertension  Sleep Patterns: No sleep issues, feels rested on waking and sleeps 8 hours nightly. Home Safety/Smoke Alarms: Feels safe in home; uses home alarm. Smoke alarms in place. Living environment: 1-story home with basement; Lives with spouse on main level; needs for DME- uses a cane sometimes; good support system. Seat Belt Safety/Bike Helmet: Wears seat belt.     Objective:     Vitals: There were no vitals taken for this visit.  There is no height or weight on file to calculate BMI.  Advanced Directives  04/28/2020  Does Patient Have a Medical Advance Directive? Yes  Type of Advance Directive Living will;Healthcare Power of Attorney  Does patient want to make changes to medical advance directive? No - Patient declined  Copy of HGarrettin Chart? No - copy requested    Tobacco Social History   Tobacco Use  Smoking Status Never Smoker  Smokeless Tobacco Never Used     Counseling given: Not Answered   Clinical Intake:  Pre-visit preparation completed: Yes  Pain : 0-10 Pain Score: 4  Pain Type: Chronic pain Pain Location: Knee Pain Orientation: Left Pain Descriptors / Indicators: Aching, Dull, Discomfort Pain Onset: More than a month ago Pain Frequency: Intermittent Pain Relieving Factors: Pain Medicine  Pain Relieving Factors: Pain Medicine  Nutritional Risks: None Diabetes: Yes CBG done?: Yes(glucose 138) CBG resulted in Enter/ Edit results?: No Did pt. bring in CBG monitor from home?: No  How often do you need to have someone help you when you read instructions, pamphlets, or other written materials from your doctor or pharmacy?: 5 - Always What is the last grade level you completed in school?: family helps her  Interpreter Needed?: No  Comments: Natine IMidwifeInformation entered by :: [employer]oss Stores HLowell Guitar LPN  Past Medical History:  Diagnosis Date  . ALLERGIC RHINITIS 01/07/2008   Qualifier: Diagnosis of  By: JJenny ReichmannMD, JShelbie HutchingDEFICIENCY 01/07/2008   Qualifier: Diagnosis of  By: JJenny ReichmannMD, JHunt Oris  . DIABETES MELLITUS, TYPE II 06/24/2007   Qualifier: Diagnosis of  By: JJenny ReichmannMD, JHunt Oris  . DIVERTICULOSIS, COLON 01/07/2008   Qualifier: Diagnosis of  By: JJenny ReichmannMD, JHunt Oris  . Empty sella ([facility]  03/17/2013  . GERD 05/24/2009   Qualifier: Diagnosis of  By: Jenny Reichmann MD, Hunt Oris   . HYPERLIPIDEMIA 06/01/2008   Qualifier: Diagnosis of  By: Jenny Reichmann MD, Hunt Oris   . HYPERTENSION 06/24/2007   Qualifier: Diagnosis of  By: Jenny Reichmann MD, Hunt Oris     . Lumbar disc disease 10/08/2012   l4-5 per CT 2008  . MALT lymphoma ([place]) 06/08/2012   Gastric, 2001  . PULMONARY NODULE, RIGHT LOWER LOBE 01/09/2008   Qualifier: Diagnosis of  By: Jenny Reichmann MD, Hunt Oris   . Umbilical hernia 04/54/0981   Fat containing on CT 2008   Past Surgical History:  Procedure Laterality Date  . NO PAST SURGERIES    . UPPER GASTROINTESTINAL ENDOSCOPY     Family History  Problem Relation Age of Onset  . Hypertension Father   . Colon cancer Neg Hx    Social History   Socioeconomic History  . Marital status: Married    Spouse name: Not on file  . Number of children: Not on file  . Years of education: Not on file  . Highest education level: Not on file  Occupational History  . Not on file  Tobacco Use  . Smoking status: Never Smoker  . Smokeless tobacco: Never Used  Substance and Sexual Activity  . Alcohol use: No  . Drug use: No  . Sexual activity: Not on file  Other Topics Concern  . Not on file  Social History Narrative  . Not on file   Social Determinants of Health   Financial Resource Strain:   . Difficulty of Paying Living Expenses:   Food Insecurity:   . Worried About Charity fundraiser in the Last Year:   . Arboriculturist in the Last Year:   Transportation Needs:   . Film/video editor (Medical):   Marland Kitchen Lack of Transportation (Non-Medical):   Physical Activity:   . Days of Exercise per Week:   . Minutes of Exercise per Session:   Stress:   . Feeling of Stress :   Social Connections:   . Frequency of Communication with Friends and Family:   . Frequency of Social Gatherings with Friends and Family:   . Attends Religious Services:   . Active Member of Clubs or Organizations:   . Attends Archivist Meetings:   Marland Kitchen Marital Status:     Outpatient Encounter Medications as of 04/28/2020  Medication Sig  . amLODipine (NORVASC) 5 MG tablet TAKE ONE TABLET BY MOUTH DAILY Annual appt due in Oct must see provider for future refills   . aspirin 81 MG tablet Take 81 mg by mouth daily.  . blood glucose meter kit and supplies KIT Dispense based on patient and insurance preference. Use up to four times daily as directed E11.9  . empagliflozin (JARDIANCE) 25 MG TABS tablet Take 25 mg by mouth daily before breakfast.  . glipiZIDE (GLUCOTROL XL) 5 MG 24 hr tablet Take 1 tablet (5 mg total) by mouth daily with breakfast.  . irbesartan (AVAPRO) 300 MG tablet Take 1 tablet (300 mg total) by mouth daily. Annual appt due in Oct must see provider for future refills  . meloxicam (MOBIC) 7.5 MG tablet Take 1 tablet (7.5 mg total) by mouth daily.  . metFORMIN (GLUCOPHAGE) 500 MG tablet Take 2 tablets (1,000 mg total) by mouth 2 (two) times daily with a meal. Annual appt due in Oct must see provider for future refills  . nebivolol (BYSTOLIC) 5 MG  tablet Take 1 tablet (5 mg total) by mouth daily. Annual appt due in Oct must see provider for future refills  . ONETOUCH DELICA LANCETS 59Y MISC Use to help check blood sugars three times a day Dx E11.9  . pravastatin (PRAVACHOL) 40 MG tablet TAKE ONE TABLET BY MOUTH DAILY  Annual appt due in Oct must see provider for future refills  . tiZANidine (ZANAFLEX) 2 MG tablet Take 1 tablet (2 mg total) by mouth every 6 (six) hours as needed for muscle spasms.  . traMADol (ULTRAM) 50 MG tablet Take 1 tablet (50 mg total) by mouth every 8 (eight) hours as needed.   No facility-administered encounter medications on file as of 04/28/2020.    Activities of Daily Living In your present state of health, do you have any difficulty performing the following activities: 04/28/2020  Hearing? N  Vision? N  Difficulty concentrating or making decisions? N  Walking or climbing stairs? N  Dressing or bathing? N  Doing errands, shopping? Y  Preparing Food and eating ? N  Using the Toilet? N  In the past six months, have you accidently leaked urine? N  Do you have problems with loss of bowel control? N  Managing your  Medications? N  Managing your Finances? N  Housekeeping or managing your Housekeeping? N  Some recent data might be hidden    Patient Care Team: Biagio Borg, MD as PCP - General    Assessment:   This is a routine wellness examination for Andrea Anthony.  Exercise Activities and Dietary recommendations Current Exercise Habits: Home exercise routine, Type of exercise: exercise ball, Time (Minutes): 30, Frequency (Times/Week): 5, Weekly Exercise (Minutes/Week): 150, Intensity: Mild, Exercise limited by: orthopedic condition(s)  Goals    . DIET - EAT MORE FRUITS AND VEGETABLES    . DIET - INCREASE WATER INTAKE    . DIET - REDUCE SUGAR INTAKE       Fall Risk Fall Risk  04/22/2020 04/22/2020 09/12/2019 06/05/2018 05/15/2017  Falls in the past year? 0 1 0 No No  Number falls in past yr: 0 0 - - -  Injury with Fall? 0 0 - - -  Risk for fall due to : - History of fall(s) - - -  Follow up - Falls evaluation completed - - -   Is the patient's home free of loose throw rugs in walkways, pet beds, electrical cords, etc?   yes      Grab bars in the bathroom? no      Handrails on the stairs?   yes      Adequate lighting?   yes  Depression Screen PHQ 2/9 Scores 04/22/2020 04/22/2020 09/12/2019 06/05/2018  PHQ - 2 Score 0 0 1 0          Immunization History  Administered Date(s) Administered  . Fluad Quad(high Dose 65+) 09/12/2019  . H1N1 11/12/2008  . Influenza Whole 01/07/2008  . Influenza, High Dose Seasonal PF 11/09/2015, 10/03/2016, 10/23/2017, 11/20/2018  . Influenza,inj,Quad PF,6+ Mos 09/16/2013  . Pneumococcal Conjugate-13 09/16/2013  . Pneumococcal Polysaccharide-23 05/24/2009, 06/05/2018  . Td 05/24/2009  . Tdap 09/12/2019  . Zoster 10/03/2016    Qualifies for Shingles Vaccine? declined  Screening Tests Health Maintenance  Topic Date Due  . OPHTHALMOLOGY EXAM  11/12/2019  . COVID-19 Vaccine (1) 05/08/2020 (Originally 05/24/1962)  . INFLUENZA VACCINE  07/11/2020  . FOOT EXAM   09/11/2020  . HEMOGLOBIN A1C  10/23/2020  . COLONOSCOPY  06/06/2021  . MAMMOGRAM  11/23/2021  . TETANUS/TDAP  09/11/2029  . DEXA SCAN  Completed  . Hepatitis C Screening  Completed  . PNA vac Low Risk Adult  Completed    Cancer Screenings: Lung: Low Dose CT Chest recommended if Age 61-80 years, 30 pack-year currently smoking OR have quit w/in 15years. Patient does not qualify. Breast:  Up to date on Mammogram? Yes   Up to date of Bone Density/Dexa? Yes Colorectal: Yes, due 05/2026    Plan:     Reviewed health maintenance screenings with patient today and relevant education, vaccines, and/or referrals were provided.    Continue doing brain stimulating activities (puzzles, reading, adult coloring books, staying active) to keep memory sharp.    Continue to eat heart healthy diet (full of fruits, vegetables, whole grains, lean protein, water--limit salt, fat, and sugar intake) and increase physical activity as tolerated.   I have personally reviewed and noted the following in the patient's chart:   . Medical and social history . Use of alcohol, tobacco or illicit drugs  . Current medications and supplements . Functional ability and status . Nutritional status . Physical activity . Advanced directives . List of other physicians . Hospitalizations, surgeries, and ER visits in previous 12 months . Vitals . Screenings to include cognitive, depression, and falls . Referrals and appointments  In addition, I have reviewed and discussed with patient certain preventive protocols, quality metrics, and best practice recommendations. A written personalized care plan for preventive services as well as general preventive health recommendations were provided to patient.     Sheral Flow, LPN  9/39/6886 Nurse Health Advisor  Nurse Notes: There were vitals filed taken in the home bu daughter for this visit. There is no height or weight on file to calculate BMI.

## 2020-05-31 ENCOUNTER — Inpatient Hospital Stay: Admission: RE | Admit: 2020-05-31 | Payer: Medicare Other | Source: Ambulatory Visit

## 2020-06-28 DIAGNOSIS — H524 Presbyopia: Secondary | ICD-10-CM | POA: Diagnosis not present

## 2020-06-28 DIAGNOSIS — H52223 Regular astigmatism, bilateral: Secondary | ICD-10-CM | POA: Diagnosis not present

## 2020-07-09 ENCOUNTER — Encounter: Payer: Self-pay | Admitting: Internal Medicine

## 2020-07-09 ENCOUNTER — Other Ambulatory Visit: Payer: Self-pay

## 2020-07-09 ENCOUNTER — Ambulatory Visit
Admission: RE | Admit: 2020-07-09 | Discharge: 2020-07-09 | Disposition: A | Discharge status: Home / Self Care | Payer: Medicare HMO | Source: Ambulatory Visit | Attending: Internal Medicine | Admitting: Internal Medicine

## 2020-07-09 ENCOUNTER — Ambulatory Visit (INDEPENDENT_AMBULATORY_CARE_PROVIDER_SITE_OTHER): Payer: Medicare HMO | Admitting: Internal Medicine

## 2020-07-09 VITALS — BP 140/90 | HR 70 | Temp 98.4°F | Ht 67.0 in | Wt 193.0 lb

## 2020-07-09 DIAGNOSIS — I10 Essential (primary) hypertension: Secondary | ICD-10-CM | POA: Diagnosis not present

## 2020-07-09 DIAGNOSIS — M25562 Pain in left knee: Secondary | ICD-10-CM | POA: Diagnosis not present

## 2020-07-09 DIAGNOSIS — R079 Chest pain, unspecified: Secondary | ICD-10-CM | POA: Diagnosis not present

## 2020-07-09 DIAGNOSIS — E785 Hyperlipidemia, unspecified: Secondary | ICD-10-CM | POA: Diagnosis not present

## 2020-07-09 DIAGNOSIS — E118 Type 2 diabetes mellitus with unspecified complications: Secondary | ICD-10-CM

## 2020-07-09 DIAGNOSIS — M549 Dorsalgia, unspecified: Secondary | ICD-10-CM | POA: Diagnosis not present

## 2020-07-09 DIAGNOSIS — M79662 Pain in left lower leg: Secondary | ICD-10-CM

## 2020-07-09 DIAGNOSIS — M7989 Other specified soft tissue disorders: Secondary | ICD-10-CM

## 2020-07-09 DIAGNOSIS — M7122 Synovial cyst of popliteal space [Baker], left knee: Secondary | ICD-10-CM | POA: Diagnosis not present

## 2020-07-09 LAB — CBC WITH DIFFERENTIAL/PLATELET
Absolute Monocytes: 533 cells/uL (ref 200–950)
Basophils Absolute: 73 cells/uL (ref 0–200)
Basophils Relative: 1 %
Eosinophils Absolute: 248 cells/uL (ref 15–500)
Eosinophils Relative: 3.4 %
HCT: 41 % (ref 35.0–45.0)
Hemoglobin: 13.1 g/dL (ref 11.7–15.5)
Lymphs Abs: 3562 cells/uL (ref 850–3900)
MCH: 28.3 pg (ref 27.0–33.0)
MCHC: 32 g/dL (ref 32.0–36.0)
MCV: 88.6 fL (ref 80.0–100.0)
MPV: 11.5 fL (ref 7.5–12.5)
Monocytes Relative: 7.3 %
Neutro Abs: 2884 cells/uL (ref 1500–7800)
Neutrophils Relative %: 39.5 %
Platelets: 246 10*3/uL (ref 140–400)
RBC: 4.63 10*6/uL (ref 3.80–5.10)
RDW: 12.8 % (ref 11.0–15.0)
Total Lymphocyte: 48.8 %
WBC: 7.3 10*3/uL (ref 3.8–10.8)

## 2020-07-09 MED ORDER — HYDROCODONE-ACETAMINOPHEN 5-325 MG PO TABS: 1.0000 | ORAL_TABLET | Freq: Four times a day (QID) | ORAL | 0 refills | Status: DC | PRN

## 2020-07-09 NOTE — Assessment & Plan Note (Signed)
stable overall by history and exam, recent data reviewed with pt, and pt to continue medical treatment as before,  to f/u any worsening symptoms or concerns  

## 2020-07-09 NOTE — Assessment & Plan Note (Signed)
C/w djd flare - for pain control, refer sports med

## 2020-07-09 NOTE — Progress Notes (Addendum)
Subjective:    Patient ID: Andrea Anthony, female    DOB: 08-21-1946, 74 y.o.   MRN: [id number]  HPI  Here to f/u with daughter as interpretor.  Pt remains a very poor historian, as simply yes no questions are responded to with 2-3 minutes of haitian speech which is then found to not answer the question.  The best I can tell is the pt c/o 4-5  sharp pain to left periscapular area with some radaiton without rash to the left anterior lower chest costal margin which is clearly pleuritic and somewhat worse with body position such as sitting up or lying down, and is pleuritic.  After 5 minutes of persistent questioning, pt is unable to understand or respond to the question of ? SOB with this.  Only keeps saying the pain, the pain.  Also noted is LLE pain for the same time it seems which is dull, not assoc with rash or tenderness to the anterior left upper leg more than lower leg, but is associated with left leg swelling.  Does have also left knee pain and effusion today with reduced ROM. Also has recurring HAs of unclear etiology for many months to years.  As best I can tell, pt denies ST, cough, wheezing, abd pain, n/v, palpitaitons, fever, chills, worsening LBP or right leg pain or swelling Past Medical History:  Diagnosis Date  . ALLERGIC RHINITIS 01/07/2008   Qualifier: Diagnosis of  By: Jenny Reichmann MD, Shelbie Hutching DEFICIENCY 01/07/2008   Qualifier: Diagnosis of  By: Jenny Reichmann MD, Hunt Oris   . DIABETES MELLITUS, TYPE II 06/24/2007   Qualifier: Diagnosis of  By: Jenny Reichmann MD, Hunt Oris   . DIVERTICULOSIS, COLON 01/07/2008   Qualifier: Diagnosis of  By: Jenny Reichmann MD, Hunt Oris   . Empty sella ([place]) 03/17/2013  . GERD 05/24/2009   Qualifier: Diagnosis of  By: Jenny Reichmann MD, Hunt Oris   . HYPERLIPIDEMIA 06/01/2008   Qualifier: Diagnosis of  By: Jenny Reichmann MD, Hunt Oris   . HYPERTENSION 06/24/2007   Qualifier: Diagnosis of  By: Jenny Reichmann MD, Hunt Oris   . Lumbar disc disease 10/08/2012   l4-5 per CT 2008  . MALT lymphoma ([place]) 06/08/2012     Gastric, 2001  . PULMONARY NODULE, RIGHT LOWER LOBE 01/09/2008   Qualifier: Diagnosis of  By: Jenny Reichmann MD, Hunt Oris   . Umbilical hernia 55/20/8022   Fat containing on CT 2008   Past Surgical History:  Procedure Laterality Date  . NO PAST SURGERIES    . UPPER GASTROINTESTINAL ENDOSCOPY      reports that she has never smoked. She has never used smokeless tobacco. She reports that she does not drink alcohol and does not use drugs. family history includes Hypertension in her father. Allergies  Allergen Reactions  . Ace Inhibitors     REACTION: cough  . Sulfonamide Derivatives     Not sure of reaction   Current Outpatient Medications on File Prior to Visit  Medication Sig Dispense Refill  . amLODipine (NORVASC) 5 MG tablet TAKE ONE TABLET BY MOUTH DAILY Annual appt due in Oct must see provider for future refills 90 tablet 1  . aspirin 81 MG tablet Take 81 mg by mouth daily.    . blood glucose meter kit and supplies KIT Dispense based on patient and insurance preference. Use up to four times daily as directed E11.9 1 each 0  . empagliflozin (JARDIANCE) 25 MG TABS tablet Take 25 mg by mouth daily before breakfast.  90 tablet 3  . glipiZIDE (GLUCOTROL XL) 5 MG 24 hr tablet Take 1 tablet (5 mg total) by mouth daily with breakfast. 90 tablet 3  . irbesartan (AVAPRO) 300 MG tablet Take 1 tablet (300 mg total) by mouth daily. Annual appt due in Oct must see provider for future refills 90 tablet 1  . meloxicam (MOBIC) 7.5 MG tablet Take 1 tablet (7.5 mg total) by mouth daily. 30 tablet 1  . metFORMIN (GLUCOPHAGE) 500 MG tablet Take 2 tablets (1,000 mg total) by mouth 2 (two) times daily with a meal. Annual appt due in Oct must see provider for future refills 360 tablet 1  . nebivolol (BYSTOLIC) 5 MG tablet Take 1 tablet (5 mg total) by mouth daily. Annual appt due in Oct must see provider for future refills 90 tablet 1  . ONETOUCH DELICA LANCETS 38I MISC Use to help check blood sugars three times a  day Dx E11.9 100 each 11  . pravastatin (PRAVACHOL) 40 MG tablet TAKE ONE TABLET BY MOUTH DAILY  Annual appt due in Oct must see provider for future refills 90 tablet 1  . tiZANidine (ZANAFLEX) 2 MG tablet Take 1 tablet (2 mg total) by mouth every 6 (six) hours as needed for muscle spasms. 30 tablet 1  . traMADol (ULTRAM) 50 MG tablet Take 1 tablet (50 mg total) by mouth every 8 (eight) hours as needed. 60 tablet 0   No current facility-administered medications on file prior to visit.   Review of Systems All otherwise neg per pt    Objective:   Physical Exam BP (!) 140/90 (BP Location: Left Arm, Patient Position: Sitting, Cuff Size: Large)   Pulse 70   Temp 98.4 F (36.9 C) (Oral)   Ht '5\' 7"'  (1.702 m)   Wt 193 lb (87.5 kg)   SpO2 98%   BMI 30.23 kg/m  VS noted,  Constitutional: Pt appears in NAD HENT: Head: NCAT.  Right Ear: External ear normal.  Left Ear: External ear normal.  Eyes: . Pupils are equal, round, and reactive to light. Conjunctivae and EOM are normal Nose: without d/c or deformity Neck: Neck supple. Gross normal ROM Cardiovascular: Normal rate and regular rhythm.   Pulmonary/Chest: Effort normal and breath sounds without rales or wheezing.  Abd:  Soft, NT, ND, + BS, no organomegaly Neurological: Pt is alert. At baseline orientation, motor grossly intact Skin: Skin is warm. No rashes, other new lesions, 1+ diffuse LLE edema, also left knee with trace effusion and bony degenerative changes Psychiatric: Pt behavior is normal without agitation  .All otherwise neg per pt Lab Results  Component Value Date   WBC 8.3 04/22/2020   HGB 13.8 04/22/2020   HCT 42.0 04/22/2020   PLT 224.0 04/22/2020   GLUCOSE 136 (H) 04/22/2020   CHOL 153 04/22/2020   TRIG 185.0 (H) 04/22/2020   HDL 47.20 04/22/2020   LDLCALC 68 04/22/2020   ALT 24 04/22/2020   AST 21 04/22/2020   NA 135 04/22/2020   K 4.8 04/22/2020   CL 101 04/22/2020   CREATININE 1.01 04/22/2020   BUN 20  04/22/2020   CO2 27 04/22/2020   TSH 1.36 04/22/2020   HGBA1C 8.0 (H) 04/22/2020   MICROALBUR <0.7 04/22/2020      Assessment & Plan:

## 2020-07-09 NOTE — Assessment & Plan Note (Signed)
?   msk vs other pleuritic such as acute PE - for cta chest

## 2020-07-09 NOTE — Patient Instructions (Signed)
Please take all new medication as prescribed - the pain medication  Please continue all other medications as before, and refills have been done if requested.  Please have the pharmacy call with any other refills you may need.  Please keep your appointments with your specialists as you may have planned  You will be contacted regarding the referral for: CT scan for the chest, and the ultrasound for the left leg (today) - to see PCCs now for scheduling  You will be contacted regarding the referral for: sports medicine (for later)  Please go to the LAB at the blood drawing area for the tests to be done  You will be contacted by phone if any changes need to be made immediately.  Otherwise, you will receive a letter about your results with an explanation, but please check with MyChart first.  Please remember to sign up for MyChart if you have not done so, as this will be important to you in the future with finding out test results, communicating by private email, and scheduling acute appointments online when needed.

## 2020-07-09 NOTE — Assessment & Plan Note (Addendum)
?   Left knee related vs DVT - for LLE venous doppler, pain control, d dimer stat

## 2020-07-09 NOTE — Assessment & Plan Note (Addendum)
?   msk vs other such as acute PE , for labs,  for CTA chest, declines referral to ED  I spent 31 minutes in preparing to see the patient by review of recent labs, imaging and procedures, obtaining and reviewing separately obtained history, communicating with the patient and family or caregiver, ordering medications, tests or procedures, and documenting clinical information in the EHR including the differential Dx, treatment, and any further evaluation and other management of chest pain, back pain, left knee pain, left leg pain and swelling, dm, htn, hld

## 2020-07-10 ENCOUNTER — Encounter: Payer: Self-pay | Admitting: Internal Medicine

## 2020-07-10 LAB — COMPLETE METABOLIC PANEL WITH GFR
AG Ratio: 1.5 (calc) (ref 1.0–2.5)
ALT: 21 U/L (ref 6–29)
AST: 17 U/L (ref 10–35)
Albumin: 4.3 g/dL (ref 3.6–5.1)
Alkaline phosphatase (APISO): 87 U/L (ref 37–153)
BUN/Creatinine Ratio: 11 (calc) (ref 6–22)
BUN: 12 mg/dL (ref 7–25)
CO2: 29 mmol/L (ref 20–32)
Calcium: 10 mg/dL (ref 8.6–10.4)
Chloride: 103 mmol/L (ref 98–110)
Creat: 1.07 mg/dL — ABNORMAL HIGH (ref 0.60–0.93)
GFR, Est African American: 60 mL/min/{1.73_m2} (ref >=60)
GFR, Est Non African American: 51 mL/min/{1.73_m2} — ABNORMAL LOW (ref >=60)
Globulin: 2.8 g/dL (calc) (ref 1.9–3.7)
Glucose, Bld: 117 mg/dL — ABNORMAL HIGH (ref 65–99)
Potassium: 4.4 mmol/L (ref 3.5–5.3)
Sodium: 138 mmol/L (ref 135–146)
Total Bilirubin: 0.5 mg/dL (ref 0.2–1.2)
Total Protein: 7.1 g/dL (ref 6.1–8.1)

## 2020-07-10 LAB — D-DIMER, QUANTITATIVE: D-Dimer, Quant: 0.42 mcg/mL FEU (ref <0.50)

## 2020-07-12 ENCOUNTER — Ambulatory Visit
Admission: RE | Admit: 2020-07-12 | Discharge: 2020-07-12 | Disposition: A | Discharge status: Home / Self Care | Payer: Medicare HMO | Source: Ambulatory Visit | Attending: Internal Medicine | Admitting: Internal Medicine

## 2020-07-12 ENCOUNTER — Encounter: Payer: Self-pay | Admitting: Internal Medicine

## 2020-07-12 DIAGNOSIS — I517 Cardiomegaly: Secondary | ICD-10-CM | POA: Diagnosis not present

## 2020-07-12 DIAGNOSIS — M47814 Spondylosis without myelopathy or radiculopathy, thoracic region: Secondary | ICD-10-CM | POA: Diagnosis not present

## 2020-07-12 DIAGNOSIS — J9811 Atelectasis: Secondary | ICD-10-CM | POA: Diagnosis not present

## 2020-07-12 DIAGNOSIS — R079 Chest pain, unspecified: Secondary | ICD-10-CM

## 2020-07-12 MED ORDER — IOPAMIDOL (ISOVUE-370) INJECTION 76%
75.0000 mL | Freq: Once | INTRAVENOUS | Status: AC | PRN
  Administered 2020-07-12: 75 mL via INTRAVENOUS

## 2020-10-11 ENCOUNTER — Telehealth: Payer: Self-pay | Admitting: Internal Medicine

## 2020-10-11 NOTE — Telephone Encounter (Signed)
Patients daughter called and said that the patient does feel good after taking glipiZIDE (GLUCOTROL XL) 5 MG 24 hr tablet  She was wondering if her mother  could be put back on the medication that she was previously on.   It can be sent to [place], [place] - [address]   Please call [phone].

## 2020-10-12 MED ORDER — GLIPIZIDE ER 2.5 MG PO TB24: 2.5000 mg | ORAL_TABLET | Freq: Every day | ORAL | 3 refills | Status: DC

## 2020-10-12 NOTE — Telephone Encounter (Signed)
Ok I think she means the lower dose - glipizide 2.5 mg - I will send erx

## 2020-10-12 NOTE — Telephone Encounter (Signed)
Sent to Dr. John. 

## 2020-10-14 ENCOUNTER — Other Ambulatory Visit: Payer: Self-pay | Admitting: Internal Medicine

## 2020-10-14 DIAGNOSIS — I10 Essential (primary) hypertension: Secondary | ICD-10-CM

## 2020-10-25 ENCOUNTER — Other Ambulatory Visit: Payer: Self-pay

## 2020-10-26 ENCOUNTER — Ambulatory Visit (INDEPENDENT_AMBULATORY_CARE_PROVIDER_SITE_OTHER): Payer: Medicare HMO | Admitting: Internal Medicine

## 2020-10-26 ENCOUNTER — Encounter: Payer: Self-pay | Admitting: Internal Medicine

## 2020-10-26 VITALS — BP 120/70 | HR 68 | Temp 98.7°F | Ht 67.0 in | Wt 196.0 lb

## 2020-10-26 DIAGNOSIS — E118 Type 2 diabetes mellitus with unspecified complications: Secondary | ICD-10-CM

## 2020-10-26 DIAGNOSIS — Z23 Encounter for immunization: Secondary | ICD-10-CM | POA: Diagnosis not present

## 2020-10-26 DIAGNOSIS — I10 Essential (primary) hypertension: Secondary | ICD-10-CM

## 2020-10-26 DIAGNOSIS — E785 Hyperlipidemia, unspecified: Secondary | ICD-10-CM

## 2020-10-26 LAB — LIPID PANEL
Cholesterol: 130 mg/dL (ref 0–200)
HDL: 48.1 mg/dL (ref >39.00)
LDL Cholesterol: 60 mg/dL (ref 0–99)
NonHDL: 81.95
Total CHOL/HDL Ratio: 3
Triglycerides: 111 mg/dL (ref 0.0–149.0)
VLDL: 22.2 mg/dL (ref 0.0–40.0)

## 2020-10-26 LAB — BASIC METABOLIC PANEL
BUN: 17 mg/dL (ref 6–23)
CO2: 27 mEq/L (ref 19–32)
Calcium: 9.9 mg/dL (ref 8.4–10.5)
Chloride: 102 mEq/L (ref 96–112)
Creatinine, Ser: 1.03 mg/dL (ref 0.40–1.20)
GFR: 53.97 mL/min — ABNORMAL LOW (ref >60.00)
Glucose, Bld: 172 mg/dL — ABNORMAL HIGH (ref 70–99)
Potassium: 4.2 mEq/L (ref 3.5–5.1)
Sodium: 136 mEq/L (ref 135–145)

## 2020-10-26 LAB — HEMOGLOBIN A1C: Hgb A1c MFr Bld: 8.4 % — ABNORMAL HIGH (ref 4.6–6.5)

## 2020-10-26 LAB — HEPATIC FUNCTION PANEL
ALT: 18 U/L (ref 0–35)
AST: 18 U/L (ref 0–37)
Albumin: 4.2 g/dL (ref 3.5–5.2)
Alkaline Phosphatase: 85 U/L (ref 39–117)
Bilirubin, Direct: 0.1 mg/dL (ref 0.0–0.3)
Total Bilirubin: 0.6 mg/dL (ref 0.2–1.2)
Total Protein: 7.3 g/dL (ref 6.0–8.3)

## 2020-10-26 NOTE — Patient Instructions (Addendum)
You had the flu shot today  Please continue all other medications as before, and refills have been done if requested.  Please have the pharmacy call with any other refills you may need.  Please continue your efforts at being more active, low cholesterol diet, and weight control.  Please keep your appointments with your specialists as you may have planned  Please go to the LAB at the blood drawing area for the tests to be done  You will be contacted by phone if any changes need to be made immediately.  Otherwise, you will receive a letter about your results with an explanation, but please check with MyChart first.  Please remember to sign up for MyChart if you have not done so, as this will be important to you in the future with finding out test results, communicating by private email, and scheduling acute appointments online when needed.  Please make an Appointment to return in 6 months, or sooner if needed 

## 2020-10-26 NOTE — Progress Notes (Signed)
Subjective:    Patient ID: Andrea Anthony, female    DOB: 1946-10-11, 74 y.o.   MRN: [id number]  HPI  Here to f/u; overall doing ok,  Pt denies chest pain, increasing sob or doe, wheezing, orthopnea, PND, increased LE swelling, palpitations, dizziness or syncope.  Pt denies new neurological symptoms such as new headache, or facial or extremity weakness or numbness.  Pt denies polydipsia, polyuria, or low sugar episode.  Pt states overall good compliance with meds, but Gained 12 lbs in 6 months with less better diet.   Wt Readings from Last 3 Encounters:  10/26/20 196 lb (88.9 kg)  07/09/20 193 lb (87.5 kg)  04/22/20 184 lb (83.5 kg)  Sugars have been somewhat labile, did not seem to tolerate the 5 mg glipizide, so back to the 2.5 mg, and sometimes skips the med completely when has a lower sugar in the am.  Diet is some different in [place] prior to may 2021.  While in [place] sugars are usually low 100s in the am, but maybe up to 200 later in the day depending on diet.  Not sure if she is going back to [place] due to political unrest there now.   Past Medical History:  Diagnosis Date  . ALLERGIC RHINITIS 01/07/2008   Qualifier: Diagnosis of  By: Jenny Reichmann MD, Shelbie Hutching DEFICIENCY 01/07/2008   Qualifier: Diagnosis of  By: Jenny Reichmann MD, Hunt Oris   . DIABETES MELLITUS, TYPE II 06/24/2007   Qualifier: Diagnosis of  By: Jenny Reichmann MD, Hunt Oris   . DIVERTICULOSIS, COLON 01/07/2008   Qualifier: Diagnosis of  By: Jenny Reichmann MD, Hunt Oris   . Empty sella ([place]) 03/17/2013  . GERD 05/24/2009   Qualifier: Diagnosis of  By: Jenny Reichmann MD, Hunt Oris   . HYPERLIPIDEMIA 06/01/2008   Qualifier: Diagnosis of  By: Jenny Reichmann MD, Hunt Oris   . HYPERTENSION 06/24/2007   Qualifier: Diagnosis of  By: Jenny Reichmann MD, Hunt Oris   . Lumbar disc disease 10/08/2012   l4-5 per CT 2008  . 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 PULMONARY NODULE, RIGHT LOWER LOBE 01/09/2008   Qualifier: Diagnosis of  By: Jenny Reichmann MD, Hunt Oris   . Umbilical hernia 62/26/3335     Fat containing on CT 2008   Past Surgical History:  Procedure Laterality Date  . NO PAST SURGERIES    . UPPER GASTROINTESTINAL ENDOSCOPY      reports that she has never smoked. She has never used smokeless tobacco. She reports that she does not drink alcohol and does not use drugs. family history includes Hypertension in her father. Allergies  Allergen Reactions  . Ace Inhibitors     REACTION: cough  . Sulfonamide Derivatives     Not sure of reaction   Current Outpatient Medications on File Prior to Visit  Medication Sig Dispense Refill  . amLODipine (NORVASC) 5 MG tablet TAKE ONE TABLET BY MOUTH DAILY * MUST KEEP ANNUAL APPOINTMENT FOR REFILLS* 90 tablet 1  . aspirin 81 MG tablet Take 81 mg by mouth daily.    . blood glucose meter kit and supplies KIT Dispense based on patient and insurance preference. Use up to four times daily as directed E11.9 1 each 0  . BYSTOLIC 5 MG tablet TAKE ONE TABLET BY MOUTH DAILY 90 tablet 1  . empagliflozin (JARDIANCE) 25 MG TABS tablet Take 25 mg by mouth daily before breakfast. 90 tablet 3  . HYDROcodone-acetaminophen (NORCO/VICODIN) 5-325 MG tablet Take  1 tablet by mouth every 6 (six) hours as needed. 30 tablet 0  . irbesartan (AVAPRO) 300 MG tablet TAKE ONE TABLET BY MOUTH DAILY 90 tablet 1  . meloxicam (MOBIC) 7.5 MG tablet Take 1 tablet (7.5 mg total) by mouth daily. 30 tablet 1  . metFORMIN (GLUCOPHAGE) 500 MG tablet TAKE TWO TABLETS BY MOUTH TWICE A DAY WITH MEAL 360 tablet 1  . ONETOUCH DELICA LANCETS 59M MISC Use to help check blood sugars three times a day Dx E11.9 100 each 11  . pravastatin (PRAVACHOL) 40 MG tablet TAKE ONE TABLET BY MOUTH DAILY 90 tablet 1  . tiZANidine (ZANAFLEX) 2 MG tablet Take 1 tablet (2 mg total) by mouth every 6 (six) hours as needed for muscle spasms. 30 tablet 1  . traMADol (ULTRAM) 50 MG tablet Take 1 tablet (50 mg total) by mouth every 8 (eight) hours as needed. 60 tablet 0   No current facility-administered  medications on file prior to visit.   Review of Systems All otherwise neg per pt    Objective:   Physical Exam BP 120/70 (BP Location: Left Arm, Patient Position: Sitting, Cuff Size: Large)   Pulse 68   Temp 98.7 F (37.1 C) (Oral)   Ht '5\' 7"'  (1.702 m)   Wt 196 lb (88.9 kg)   SpO2 95%   BMI 30.70 kg/m  VS noted,  Constitutional: Pt appears in NAD HENT: Head: NCAT.  Right Ear: External ear normal.  Left Ear: External ear normal.  Eyes: . Pupils are equal, round, and reactive to light. Conjunctivae and EOM are normal Nose: without d/c or deformity Neck: Neck supple. Gross normal ROM Cardiovascular: Normal rate and regular rhythm.   Pulmonary/Chest: Effort normal and breath sounds without rales or wheezing.  Abd:  Soft, NT, ND, + BS, no organomegaly Neurological: Pt is alert. At baseline orientation, motor grossly intact Skin: Skin is warm. No rashes, other new lesions, no LE edema Psychiatric: Pt behavior is normal without agitation  All otherwise neg per pt Lab Results  Component Value Date   WBC 7.3 07/09/2020   HGB 13.1 07/09/2020   HCT 41.0 07/09/2020   PLT 246 07/09/2020   GLUCOSE 172 (H) 10/26/2020   CHOL 130 10/26/2020   TRIG 111.0 10/26/2020   HDL 48.10 10/26/2020   LDLCALC 60 10/26/2020   ALT 18 10/26/2020   AST 18 10/26/2020   NA 136 10/26/2020   K 4.2 10/26/2020   CL 102 10/26/2020   CREATININE 1.03 10/26/2020   BUN 17 10/26/2020   CO2 27 10/26/2020   TSH 1.36 04/22/2020   HGBA1C 8.4 (H) 10/26/2020   MICROALBUR <0.7 04/22/2020      Assessment & Plan:

## 2020-10-29 ENCOUNTER — Other Ambulatory Visit: Payer: Self-pay | Admitting: Internal Medicine

## 2020-10-29 ENCOUNTER — Encounter: Payer: Self-pay | Admitting: Internal Medicine

## 2020-10-29 MED ORDER — DAPAGLIFLOZIN PROPANEDIOL 5 MG PO TABS: 5.0000 mg | ORAL_TABLET | Freq: Every day | ORAL | 3 refills | Status: DC

## 2020-10-31 ENCOUNTER — Encounter: Payer: Self-pay | Admitting: Internal Medicine

## 2020-10-31 NOTE — Assessment & Plan Note (Signed)
stable overall by history and exam, recent data reviewed with pt, and pt to continue medical treatment as before,  to f/u any worsening symptoms or concerns  

## 2020-10-31 NOTE — Assessment & Plan Note (Addendum)
stable overall by history and exam, recent data reviewed with pt, and pt to continue medical treatment as before,  to f/u any worsening symptoms or concerns  I spent 31 minutes in preparing to see the patient by review of recent labs, imaging and procedures, obtaining and reviewing separately obtained history, communicating with the patient and family or caregiver, ordering medications, tests or procedures, and documenting clinical information in the EHR including the differential Dx, treatment, and any further evaluation and other management of dm, htn, hld  

## 2021-02-19 IMAGING — MG DIGITAL SCREENING BILAT W/ CAD
4 series · 4 of 4 positions shown · non-contrast
Comparison: None.

CLINICAL DATA: Screening.

EXAM:
DIGITAL SCREENING BILATERAL MAMMOGRAM WITH CAD

[R CC]
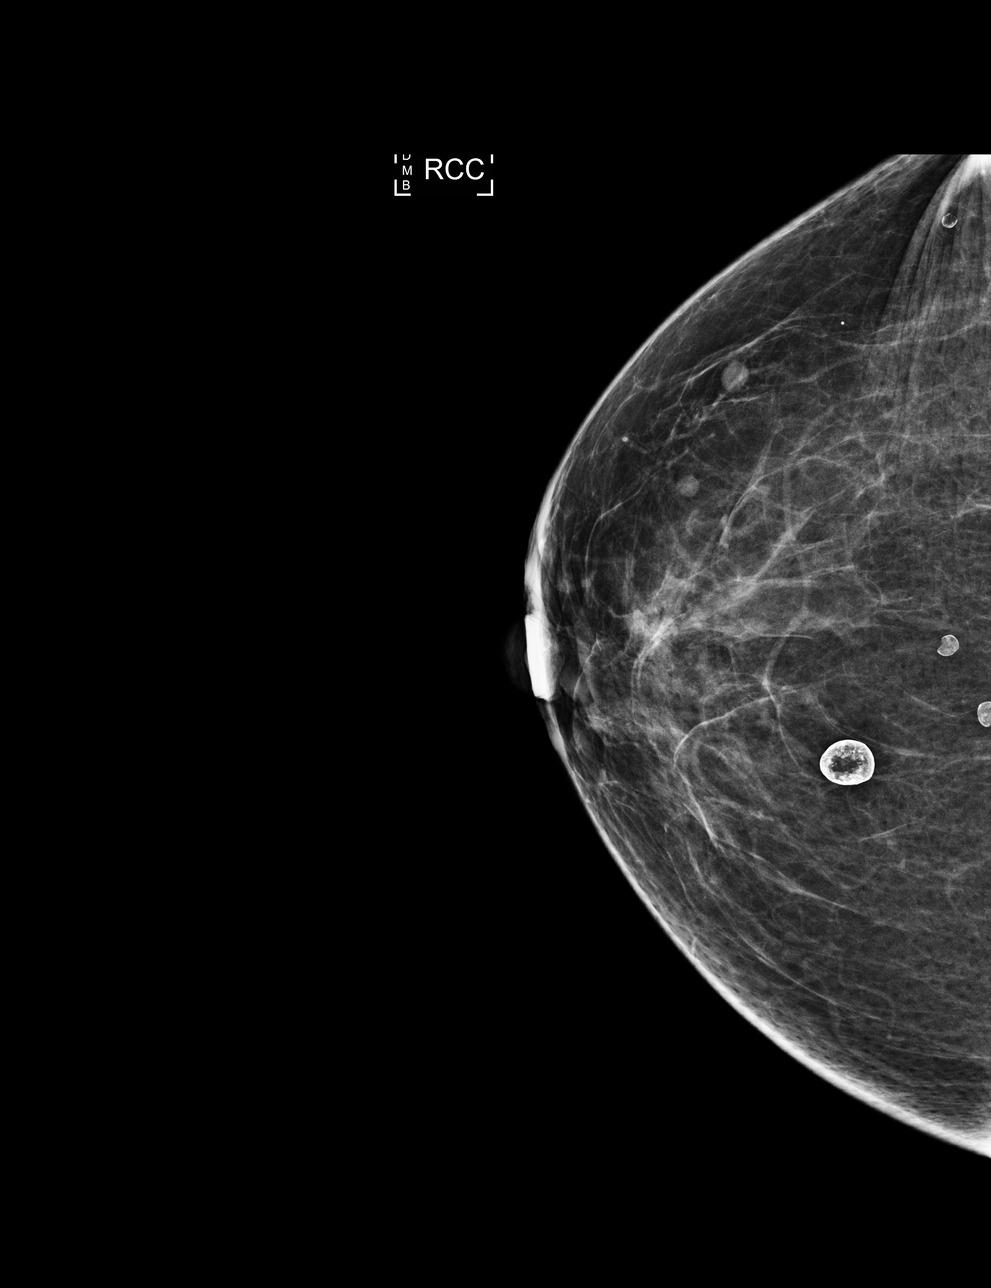

[L MLO]
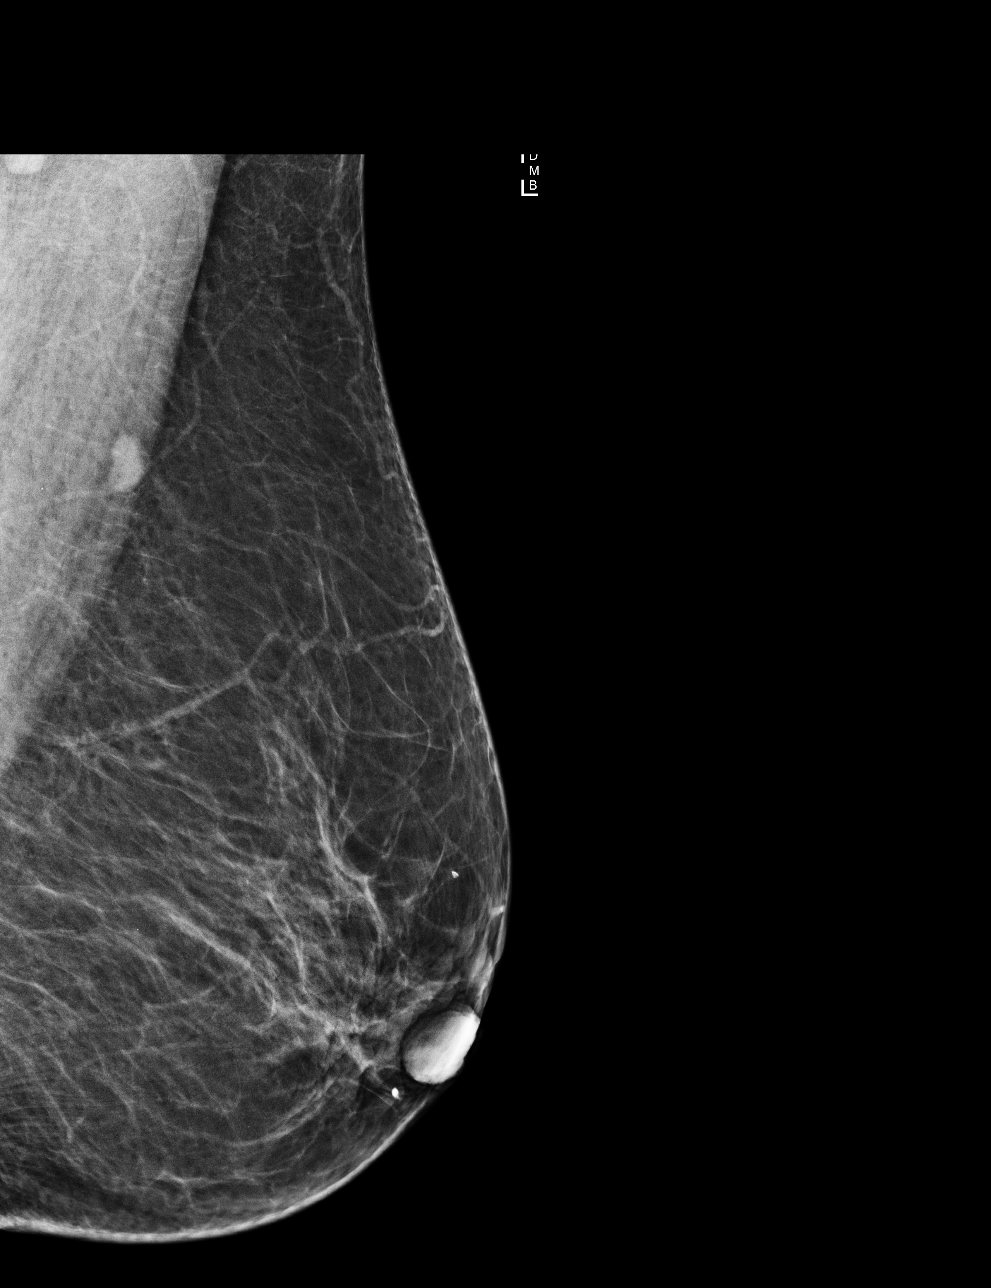

[R MLO]
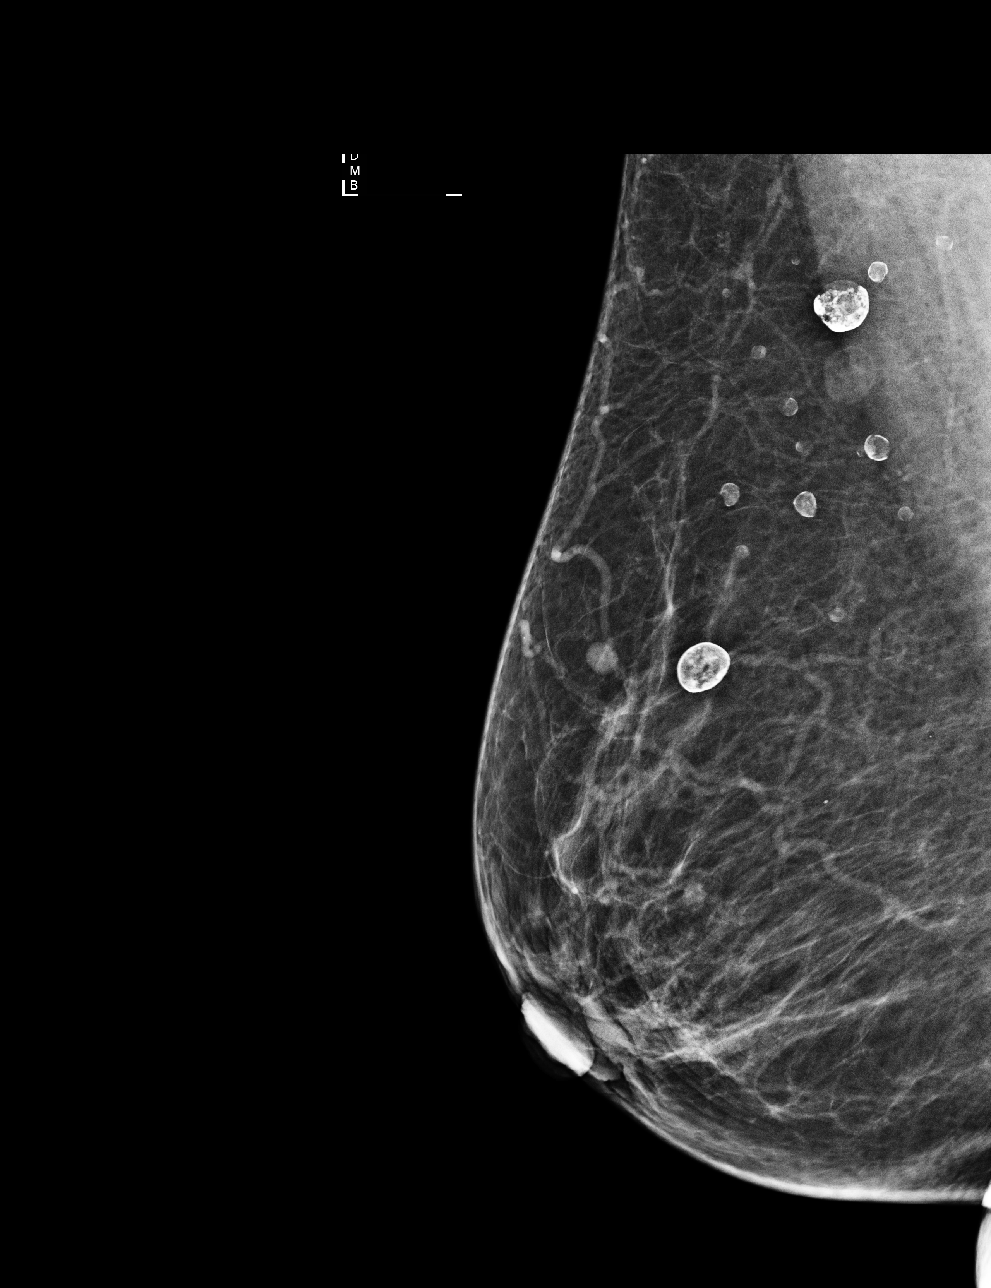

[L CC]
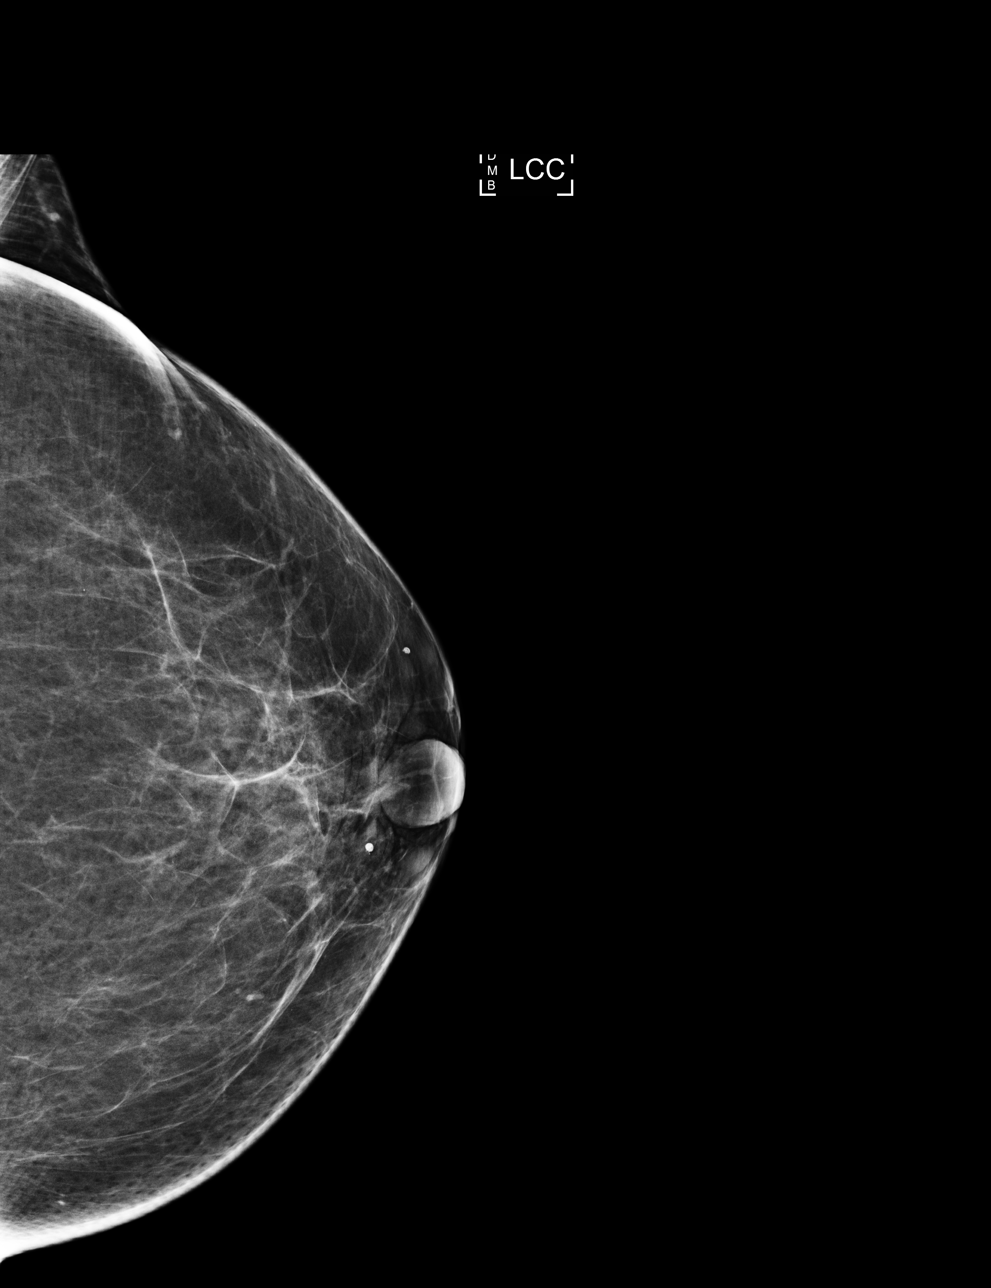

[4 of 4 positions shown; findings below may reference images not displayed]

ACR Breast Density Category b: There are scattered areas of
fibroglandular density.
FINDINGS: In the right breast, a possible mass warrants further evaluation. In
the left breast, no findings suspicious for malignancy. Images were
processed with CAD.
IMPRESSION: Further evaluation is suggested for possible mass in the right
breast.

RECOMMENDATION:
Diagnostic mammogram and possibly ultrasound of the right breast.
(Code:[id number])

The patient will be contacted regarding the findings, and additional
imaging will be scheduled.

BI-RADS CATEGORY  0: Incomplete. Need additional imaging evaluation
and/or prior mammograms for comparison.

## 2021-04-22 ENCOUNTER — Other Ambulatory Visit: Payer: Self-pay | Admitting: Internal Medicine

## 2021-04-22 DIAGNOSIS — I10 Essential (primary) hypertension: Secondary | ICD-10-CM

## 2021-04-22 NOTE — Telephone Encounter (Signed)
Please refill as per office routine med refill policy (all routine meds refilled for 3 mo or monthly per pt preference up to one year from last visit, then month to month grace period for 3 mo, then further med refills will have to be denied)  

## 2021-04-26 ENCOUNTER — Ambulatory Visit: Payer: Medicare HMO | Admitting: Internal Medicine

## 2021-04-29 ENCOUNTER — Ambulatory Visit: Payer: Medicare Other | Admitting: Internal Medicine

## 2021-05-03 ENCOUNTER — Ambulatory Visit (INDEPENDENT_AMBULATORY_CARE_PROVIDER_SITE_OTHER): Payer: Medicare Other | Admitting: Internal Medicine

## 2021-05-03 ENCOUNTER — Other Ambulatory Visit: Payer: Self-pay

## 2021-05-03 ENCOUNTER — Encounter: Payer: Self-pay | Admitting: Internal Medicine

## 2021-05-03 VITALS — BP 118/70 | HR 73 | Temp 98.2°F | Ht 67.0 in | Wt 193.0 lb

## 2021-05-03 DIAGNOSIS — Z0001 Encounter for general adult medical examination with abnormal findings: Secondary | ICD-10-CM

## 2021-05-03 DIAGNOSIS — H7091 Unspecified mastoiditis, right ear: Secondary | ICD-10-CM | POA: Diagnosis not present

## 2021-05-03 DIAGNOSIS — E118 Type 2 diabetes mellitus with unspecified complications: Secondary | ICD-10-CM | POA: Diagnosis not present

## 2021-05-03 DIAGNOSIS — E538 Deficiency of other specified B group vitamins: Secondary | ICD-10-CM

## 2021-05-03 DIAGNOSIS — E559 Vitamin D deficiency, unspecified: Secondary | ICD-10-CM

## 2021-05-03 DIAGNOSIS — M542 Cervicalgia: Secondary | ICD-10-CM | POA: Diagnosis not present

## 2021-05-03 DIAGNOSIS — I10 Essential (primary) hypertension: Secondary | ICD-10-CM

## 2021-05-03 LAB — CBC WITH DIFFERENTIAL/PLATELET
Basophils Absolute: 0 10*3/uL (ref 0.0–0.1)
Basophils Relative: 0.3 % (ref 0.0–3.0)
Eosinophils Absolute: 0.2 10*3/uL (ref 0.0–0.7)
Eosinophils Relative: 2.5 % (ref 0.0–5.0)
HCT: 42.4 % (ref 36.0–46.0)
Hemoglobin: 14 g/dL (ref 12.0–15.0)
Lymphocytes Relative: 50.1 % — ABNORMAL HIGH (ref 12.0–46.0)
Lymphs Abs: 3.9 10*3/uL (ref 0.7–4.0)
MCHC: 33.1 g/dL (ref 30.0–36.0)
MCV: 86.7 fl (ref 78.0–100.0)
Monocytes Absolute: 0.5 10*3/uL (ref 0.1–1.0)
Monocytes Relative: 6.9 % (ref 3.0–12.0)
Neutro Abs: 3.1 10*3/uL (ref 1.4–7.7)
Neutrophils Relative %: 40.2 % — ABNORMAL LOW (ref 43.0–77.0)
Platelets: 226 10*3/uL (ref 150.0–400.0)
RBC: 4.89 Mil/uL (ref 3.87–5.11)
RDW: 13.4 % (ref 11.5–15.5)
WBC: 7.7 10*3/uL (ref 4.0–10.5)

## 2021-05-03 LAB — MICROALBUMIN / CREATININE URINE RATIO
Creatinine,U: 34 mg/dL
Microalb Creat Ratio: 2.1 mg/g (ref 0.0–30.0)
Microalb, Ur: <0.7 mg/dL (ref 0.0–1.9)

## 2021-05-03 LAB — URINALYSIS, ROUTINE W REFLEX MICROSCOPIC
Bilirubin Urine: NEGATIVE
Hgb urine dipstick: NEGATIVE
Ketones, ur: NEGATIVE
Leukocytes,Ua: NEGATIVE
Nitrite: NEGATIVE
Specific Gravity, Urine: <=1.005 — AB (ref 1.000–1.030)
Total Protein, Urine: NEGATIVE
Urine Glucose: >=1000 — AB
Urobilinogen, UA: 0.2 (ref 0.0–1.0)
pH: 6 (ref 5.0–8.0)

## 2021-05-03 LAB — HEMOGLOBIN A1C: Hgb A1c MFr Bld: 8.6 % — ABNORMAL HIGH (ref 4.6–6.5)

## 2021-05-03 LAB — BASIC METABOLIC PANEL
BUN: 19 mg/dL (ref 6–23)
CO2: 27 mEq/L (ref 19–32)
Calcium: 10.5 mg/dL (ref 8.4–10.5)
Chloride: 102 mEq/L (ref 96–112)
Creatinine, Ser: 1.02 mg/dL (ref 0.40–1.20)
GFR: 54.41 mL/min — ABNORMAL LOW (ref >60.00)
Glucose, Bld: 181 mg/dL — ABNORMAL HIGH (ref 70–99)
Potassium: 4.3 mEq/L (ref 3.5–5.1)
Sodium: 138 mEq/L (ref 135–145)

## 2021-05-03 LAB — HEPATIC FUNCTION PANEL
ALT: 23 U/L (ref 0–35)
AST: 19 U/L (ref 0–37)
Albumin: 4.3 g/dL (ref 3.5–5.2)
Alkaline Phosphatase: 86 U/L (ref 39–117)
Bilirubin, Direct: 0.1 mg/dL (ref 0.0–0.3)
Total Bilirubin: 0.6 mg/dL (ref 0.2–1.2)
Total Protein: 7.6 g/dL (ref 6.0–8.3)

## 2021-05-03 LAB — LIPID PANEL
Cholesterol: 167 mg/dL (ref 0–200)
HDL: 49.3 mg/dL (ref >39.00)
LDL Cholesterol: 87 mg/dL (ref 0–99)
NonHDL: 117.53
Total CHOL/HDL Ratio: 3
Triglycerides: 151 mg/dL — ABNORMAL HIGH (ref 0.0–149.0)
VLDL: 30.2 mg/dL (ref 0.0–40.0)

## 2021-05-03 LAB — TSH: TSH: 1.54 u[IU]/mL (ref 0.35–4.50)

## 2021-05-03 LAB — VITAMIN D 25 HYDROXY (VIT D DEFICIENCY, FRACTURES): VITD: 39.46 ng/mL (ref 30.00–100.00)

## 2021-05-03 LAB — VITAMIN B12: Vitamin B-12: 562 pg/mL (ref 211–911)

## 2021-05-03 MED ORDER — CIPROFLOXACIN HCL 500 MG PO TABS: 500.0000 mg | ORAL_TABLET | Freq: Two times a day (BID) | ORAL | 0 refills | Status: AC

## 2021-05-03 MED ORDER — GABAPENTIN 100 MG PO CAPS: 100.0000 mg | ORAL_CAPSULE | Freq: Three times a day (TID) | ORAL | 5 refills | Status: DC

## 2021-05-03 NOTE — Progress Notes (Signed)
Patient ID: Andrea Anthony, female   DOB: [birth year], 75 y.o.   MRN: [id number]         Chief Complaint:: wellness exam and Follow-up (6 month f/u)  dm, right matsoiditis, RUE radiculitis        HPI:  Andrea Anthony is a 75 y.o. female here for wellness exam; declines covid booster and shignrix, o/w up to date with preventive referrals and immunizations                        Also admits to not taking all of her DM meds for unclear reasons.   Pt denies polydipsia, polyuria, or new focal neuro s/s.  Has pain and mild swelling to right ext canal and mastoid area, without fever, chills, ST, cough and Pt denies chest pain, increased sob or doe, wheezing, orthopnea, PND, increased LE swelling, palpitations, dizziness or syncope.  Also has ongoing RUE pain/neck pain uncontrolled on gabapentin 100 bid.  Has ongoing neck pain since at least 2015 and has been declining surgury.   Pt denies fever, wt loss, night sweats, loss of appetite, or other constitutional symptoms       Wt Readings from Last 3 Encounters:  05/03/21 193 lb (87.5 kg)  10/26/20 196 lb (88.9 kg)  07/09/20 193 lb (87.5 kg)   BP Readings from Last 3 Encounters:  05/03/21 118/70  10/26/20 120/70  07/09/20 (!) 140/90   Immunization History  Administered Date(s) Administered  . Fluad Quad(high Dose 65+) 09/12/2019, 10/26/2020  . H1N1 11/12/2008  . Influenza Split 10/18/2010  . Influenza Whole 01/07/2008  . Influenza, High Dose Seasonal PF 11/09/2015, 10/03/2016, 10/23/2017, 11/20/2018  . Influenza,inj,Quad PF,6+ Mos 09/16/2013  . Pneumococcal Conjugate-13 09/16/2013  . Pneumococcal Polysaccharide-23 05/24/2009, 06/05/2018  . Td 05/24/2009  . Tdap 09/12/2019  . Zoster, Live 10/03/2016   There are no preventive care reminders to display for this patient.    Past Medical History:  Diagnosis Date  . ALLERGIC RHINITIS 01/07/2008   Qualifier: Diagnosis of  By: Jenny Reichmann MD, Shelbie Hutching DEFICIENCY 01/07/2008    Qualifier: Diagnosis of  By: Jenny Reichmann MD, Hunt Oris   . DIABETES MELLITUS, TYPE II 06/24/2007   Qualifier: Diagnosis of  By: Jenny Reichmann MD, Hunt Oris   . DIVERTICULOSIS, COLON 01/07/2008   Qualifier: Diagnosis of  By: Jenny Reichmann MD, Hunt Oris   . Empty sella ([place]) 03/17/2013  . GERD 05/24/2009   Qualifier: Diagnosis of  By: Jenny Reichmann MD, Hunt Oris   . HYPERLIPIDEMIA 06/01/2008   Qualifier: Diagnosis of  By: Jenny Reichmann MD, Hunt Oris   . HYPERTENSION 06/24/2007   Qualifier: Diagnosis of  By: Jenny Reichmann MD, Hunt Oris   . Lumbar disc disease 10/08/2012   l4-5 per CT 2008  . MALT lymphoma ([place]) 06/08/2012   Gastric, 2001  . PULMONARY NODULE, RIGHT LOWER LOBE 01/09/2008   Qualifier: Diagnosis of  By: Jenny Reichmann MD, Hunt Oris   . Umbilical hernia 50/35/4656   Fat containing on CT 2008   Past Surgical History:  Procedure Laterality Date  . NO PAST SURGERIES    . UPPER GASTROINTESTINAL ENDOSCOPY      reports that she has never smoked. She has never used smokeless tobacco. She reports that she does not drink alcohol and does not use drugs. family history includes Hypertension in her father. Allergies  Allergen Reactions  . Ace Inhibitors     REACTION: cough  . Sulfonamide Derivatives     Not sure  of reaction   Current Outpatient Medications on File Prior to Visit  Medication Sig Dispense Refill  . amLODipine (NORVASC) 5 MG tablet TAKE 1 TABLET  BY MOUTH DAILY * MUDST KEEP ANNUAL APPOINTMENT FOR REFILLS * 90 tablet 1  . aspirin 81 MG tablet Take 81 mg by mouth daily.    . blood glucose meter kit and supplies KIT Dispense based on patient and insurance preference. Use up to four times daily as directed E11.9 1 each 0  . HYDROcodone-acetaminophen (NORCO/VICODIN) 5-325 MG tablet Take 1 tablet by mouth every 6 (six) hours as needed. 30 tablet 0  . irbesartan (AVAPRO) 300 MG tablet TAKE ONE TABLET BY MOUTH DAILY 90 tablet 1  . meloxicam (MOBIC) 7.5 MG tablet Take 1 tablet (7.5 mg total) by mouth daily. 30 tablet 1  . metFORMIN (GLUCOPHAGE) 500 MG  tablet TAKE 2 TABLETS BY MOUTH TWICE A DAY WITH A MEAL 360 tablet 1  . nebivolol (BYSTOLIC) 5 MG tablet TAKE ONE TABLET BY MOUTH DAILY 90 tablet 1  . ONETOUCH DELICA LANCETS 52W MISC Use to help check blood sugars three times a day Dx E11.9 100 each 11  . pravastatin (PRAVACHOL) 40 MG tablet TAKE ONE TABLET BY MOUTH DAILY 90 tablet 1  . tiZANidine (ZANAFLEX) 2 MG tablet Take 1 tablet (2 mg total) by mouth every 6 (six) hours as needed for muscle spasms. 30 tablet 1  . traMADol (ULTRAM) 50 MG tablet Take 1 tablet (50 mg total) by mouth every 8 (eight) hours as needed. 60 tablet 0   No current facility-administered medications on file prior to visit.        ROS:  All others reviewed and negative.  Objective        PE:  BP 118/70 (BP Location: Right Arm, Patient Position: Sitting, Cuff Size: Large)   Pulse 73   Temp 98.2 F (36.8 C) (Oral)   Ht _0  (1.702 m)   Wt 193 lb (87.5 kg)   SpO2 97%   BMI 30.23 kg/m                 Constitutional: Pt appears in NAD               HENT: Head: NCAT.                Right Ear: External ear normal.                 Left Ear: External ear normal.                Eyes: . Pupils are equal, round, and reactive to light. Conjunctivae and EOM are normal               Nose: without d/c or deformity; tender right mastoid                Neck: Neck supple. Gross normal ROM               Cardiovascular: Normal rate and regular rhythm.                 Pulmonary/Chest: Effort normal and breath sounds without rales or wheezing.                Abd:  Soft, NT, ND, + BS, no organomegaly               Neurological: Pt is alert. At baseline orientation, motor grossly intact  Skin: Skin is warm. No rashes, no other new lesions, LE edema - none               Psychiatric: Pt behavior is normal without agitation   Micro: none  Cardiac tracings I have personally interpreted today:  none  Pertinent Radiological findings (summarize): none   Lab Results   Component Value Date   WBC 7.7 05/03/2021   HGB 14.0 05/03/2021   HCT 42.4 05/03/2021   PLT 226.0 05/03/2021   GLUCOSE 181 (H) 05/03/2021   CHOL 167 05/03/2021   TRIG 151.0 (H) 05/03/2021   HDL 49.30 05/03/2021   LDLCALC 87 05/03/2021   ALT 23 05/03/2021   AST 19 05/03/2021   NA 138 05/03/2021   K 4.3 05/03/2021   CL 102 05/03/2021   CREATININE 1.02 05/03/2021   BUN 19 05/03/2021   CO2 27 05/03/2021   TSH 1.54 05/03/2021   HGBA1C 8.6 (H) 05/03/2021   MICROALBUR <0.7 05/03/2021   Assessment/Plan:  Andrea Anthony is a 75 y.o. Black or African American [2] female with  has a past medical history of ALLERGIC RHINITIS (01/07/2008), ANEMIA-IRON DEFICIENCY (01/07/2008), DIABETES MELLITUS, TYPE II (06/24/2007), DIVERTICULOSIS, COLON (01/07/2008), Empty sella ([place]) (03/17/2013), GERD (05/24/2009), HYPERLIPIDEMIA (06/01/2008), HYPERTENSION (06/24/2007), Lumbar disc disease (10/08/2012), MALT lymphoma ([place]) (06/08/2012), PULMONARY NODULE, RIGHT LOWER LOBE (1/88/6773), and Umbilical hernia (73/66/8159).  Encounter for well adult exam with abnormal findings Age and sex appropriate education and counseling updated with regular exercise and diet Referrals for preventative services - none needed Immunizations addressed - declines shingrix and covid booster Smoking counseling  - none needed Evidence for depression or other mood disorder - none significant Most recent labs reviewed. I have personally reviewed and have noted: 1) the patient's medical and social history 2) The patient's current medications and supplements 3) The patient's height, weight, and BMI have been recorded in the chart   Type II diabetes mellitus with manifestations ([place]) Lab Results  Component Value Date   HGBA1C 8.6 (H) 05/03/2021   Stable, pt to continue current medical treatment metformin, farxiga   Neck pain on right side Chronic mild uncontrolled - for increased gabapentin 100 tid  Mastoiditis Mild to mod, for  antibx course,  to f/u any worsening symptoms or concerns  Essential hypertension BP Readings from Last 3 Encounters:  05/03/21 118/70  10/26/20 120/70  07/09/20 (!) 140/90   Stable, pt to continue medical treatment norvasc, avapro, bystolic   Followup: Return in about 6 months (around 11/03/2021).  Cathlean Cower, MD 05/08/2021 10:37 PM [facility]

## 2021-05-03 NOTE — Patient Instructions (Signed)
Please take all new medication as prescribed - the antibiotic, and the gabapentin for nerve pain  Please continue all other medications as before, and refills have been done if requested.  Please have the pharmacy call with any other refills you may need.  Please continue your efforts at being more active, low cholesterol diet, and weight control.  You are otherwise up to date with prevention measures today.  Please keep your appointments with your specialists as you may have planned  Please go to the LAB at the blood drawing area for the tests to be done  You will be contacted by phone if any changes need to be made immediately.  Otherwise, you will receive a letter about your results with an explanation, but please check with MyChart first.  Please remember to sign up for MyChart if you have not done so, as this will be important to you in the future with finding out test results, communicating by private email, and scheduling acute appointments online when needed.  Please make an Appointment to return in 6 months, or sooner if needed

## 2021-05-06 ENCOUNTER — Encounter: Payer: Self-pay | Admitting: Internal Medicine

## 2021-05-06 ENCOUNTER — Other Ambulatory Visit: Payer: Self-pay | Admitting: Internal Medicine

## 2021-05-06 MED ORDER — DAPAGLIFLOZIN PROPANEDIOL 10 MG PO TABS
10.0000 mg | ORAL_TABLET | Freq: Every day | ORAL | 3 refills | Status: DC
Start: 2021-05-06 — End: 2021-11-16

## 2021-05-08 ENCOUNTER — Encounter: Payer: Self-pay | Admitting: Internal Medicine

## 2021-05-08 DIAGNOSIS — H7090 Unspecified mastoiditis, unspecified ear: Secondary | ICD-10-CM | POA: Insufficient documentation

## 2021-05-08 NOTE — Assessment & Plan Note (Signed)
Chronic mild uncontrolled - for increased gabapentin 100 tid

## 2021-05-08 NOTE — Assessment & Plan Note (Signed)
Mild to mod, for antibx course,  to f/u any worsening symptoms or concerns 

## 2021-05-08 NOTE — Assessment & Plan Note (Signed)
BP Readings from Last 3 Encounters:  05/03/21 118/70  10/26/20 120/70  07/09/20 (!) 140/90   Stable, pt to continue medical treatment norvasc, avapro, bystolic

## 2021-05-08 NOTE — Assessment & Plan Note (Signed)
Age and sex appropriate education and counseling updated with regular exercise and diet °Referrals for preventative services - none needed °Immunizations addressed - declines shingrix and covid booster °Smoking counseling  - none needed °Evidence for depression or other mood disorder - none significant °Most recent labs reviewed. °I have personally reviewed and have noted: °1) the patient's medical and social history °2) The patient's current medications and supplements °3) The patient's height, weight, and BMI have been recorded in the chart ° °

## 2021-05-08 NOTE — Assessment & Plan Note (Signed)
Lab Results  Component Value Date   HGBA1C 8.6 (H) 05/03/2021   Stable, pt to continue current medical treatment metformin, farxiga

## 2021-05-26 ENCOUNTER — Ambulatory Visit (INDEPENDENT_AMBULATORY_CARE_PROVIDER_SITE_OTHER): Payer: Medicare Other

## 2021-05-26 DIAGNOSIS — Z0000 Encounter for general adult medical examination without abnormal findings: Secondary | ICD-10-CM

## 2021-05-26 NOTE — Patient Instructions (Signed)
Andrea Anthony , Thank you for taking time to come for your Medicare Wellness Visit. I appreciate your ongoing commitment to your health goals. Please review the following plan we discussed and let me know if I can assist you in the future.   Screening recommendations/referrals: Colonoscopy: 06/06/2016; due every 5 years Mammogram: 11/28/2019; due every 2 years Bone Density: 05/28/2017; normal results Recommended yearly ophthalmology/optometry visit for glaucoma screening and checkup Recommended yearly dental visit for hygiene and checkup  Vaccinations: Influenza vaccine: 10/26/2020 Pneumococcal vaccine: 09/16/2013, 06/05/2018 Tdap vaccine: 09/12/2019; due every 10 years Shingles vaccine: never done   Covid-19:never done  Advanced directives: Advance directive discussed with you today. Even though you declined this today please call our office should you change your mind and we can give you the proper paperwork for you to fill out.  Conditions/risks identified: To maintain my current health status by continuing to eat healthy, stay physically active and socially active.  Next appointment: Please schedule your next Medicare Wellness Visit with your Nurse Health Advisor in 1 year by calling [phone].   Preventive Care 10 Years and Older, Female Preventive care refers to lifestyle choices and visits with your health care provider that can promote health and wellness. What does preventive care include? A yearly physical exam. This is also called an annual well check. Dental exams once or twice a year. Routine eye exams. Ask your health care provider how often you should have your eyes checked. Personal lifestyle choices, including: Daily care of your teeth and gums. Regular physical activity. Eating a healthy diet. Avoiding tobacco and drug use. Limiting alcohol use. Practicing safe sex. Taking low-dose aspirin every day. Taking vitamin and mineral supplements as recommended by your  health care provider. What happens during an annual well check? The services and screenings done by your health care provider during your annual well check will depend on your age, overall health, lifestyle risk factors, and family history of disease. Counseling  Your health care provider may ask you questions about your: Alcohol use. Tobacco use. Drug use. Emotional well-being. Home and relationship well-being. Sexual activity. Eating habits. History of falls. Memory and ability to understand (cognition). Work and work Statistician. Reproductive health. Screening  You may have the following tests or measurements: Height, weight, and BMI. Blood pressure. Lipid and cholesterol levels. These may be checked every 5 years, or more frequently if you are over 79 years old. Skin check. Lung cancer screening. You may have this screening every year starting at age 7 if you have a 30-pack-year history of smoking and currently smoke or have quit within the past 15 years. Fecal occult blood test (FOBT) of the stool. You may have this test every year starting at age 24. Flexible sigmoidoscopy or colonoscopy. You may have a sigmoidoscopy every 5 years or a colonoscopy every 10 years starting at age 88. Hepatitis C blood test. Hepatitis B blood test. Sexually transmitted disease (STD) testing. Diabetes screening. This is done by checking your blood sugar (glucose) after you have not eaten for a while (fasting). You may have this done every 1-3 years. Bone density scan. This is done to screen for osteoporosis. You may have this done starting at age 101. Mammogram. This may be done every 1-2 years. Talk to your health care provider about how often you should have regular mammograms. Talk with your health care provider about your test results, treatment options, and if necessary, the need for more tests. Vaccines  Your health care provider may  recommend certain vaccines, such as: Influenza vaccine.  This is recommended every year. Tetanus, diphtheria, and acellular pertussis (Tdap, Td) vaccine. You may need a Td booster every 10 years. Zoster vaccine. You may need this after age 26. Pneumococcal 13-valent conjugate (PCV13) vaccine. One dose is recommended after age 54. Pneumococcal polysaccharide (PPSV23) vaccine. One dose is recommended after age 3. Talk to your health care provider about which screenings and vaccines you need and how often you need them. This information is not intended to replace advice given to you by your health care provider. Make sure you discuss any questions you have with your health care provider. Document Released: 12/24/2015 Document Revised: 08/16/2016 Document Reviewed: 09/28/2015 Elsevier Interactive Patient Education  2017 [place] Prevention in the Home Falls can cause injuries. They can happen to people of all ages. There are many things you can do to make your home safe and to help prevent falls. What can I do on the outside of my home? Regularly fix the edges of walkways and driveways and fix any cracks. Remove anything that might make you trip as you walk through a door, such as a raised step or threshold. Trim any bushes or trees on the path to your home. Use bright outdoor lighting. Clear any walking paths of anything that might make someone trip, such as rocks or tools. Regularly check to see if handrails are loose or broken. Make sure that both sides of any steps have handrails. Any raised decks and porches should have guardrails on the edges. Have any leaves, snow, or ice cleared regularly. Use sand or salt on walking paths during winter. Clean up any spills in your garage right away. This includes oil or grease spills. What can I do in the bathroom? Use night lights. Install grab bars by the toilet and in the tub and shower. Do not use towel bars as grab bars. Use non-skid mats or decals in the tub or shower. If you need to sit  down in the shower, use a plastic, non-slip stool. Keep the floor dry. Clean up any water that spills on the floor as soon as it happens. Remove soap buildup in the tub or shower regularly. Attach bath mats securely with double-sided non-slip rug tape. Do not have throw rugs and other things on the floor that can make you trip. What can I do in the bedroom? Use night lights. Make sure that you have a light by your bed that is easy to reach. Do not use any sheets or blankets that are too big for your bed. They should not hang down onto the floor. Have a firm chair that has side arms. You can use this for support while you get dressed. Do not have throw rugs and other things on the floor that can make you trip. What can I do in the kitchen? Clean up any spills right away. Avoid walking on wet floors. Keep items that you use a lot in easy-to-reach places. If you need to reach something above you, use a strong step stool that has a grab bar. Keep electrical cords out of the way. Do not use floor polish or wax that makes floors slippery. If you must use wax, use non-skid floor wax. Do not have throw rugs and other things on the floor that can make you trip. What can I do with my stairs? Do not leave any items on the stairs. Make sure that there are handrails on both sides of  the stairs and use them. Fix handrails that are broken or loose. Make sure that handrails are as long as the stairways. Check any carpeting to make sure that it is firmly attached to the stairs. Fix any carpet that is loose or worn. Avoid having throw rugs at the top or bottom of the stairs. If you do have throw rugs, attach them to the floor with carpet tape. Make sure that you have a light switch at the top of the stairs and the bottom of the stairs. If you do not have them, ask someone to add them for you. What else can I do to help prevent falls? Wear shoes that: Do not have high heels. Have rubber bottoms. Are  comfortable and fit you well. Are closed at the toe. Do not wear sandals. If you use a stepladder: Make sure that it is fully opened. Do not climb a closed stepladder. Make sure that both sides of the stepladder are locked into place. Ask someone to hold it for you, if possible. Clearly mark and make sure that you can see: Any grab bars or handrails. First and last steps. Where the edge of each step is. Use tools that help you move around (mobility aids) if they are needed. These include: Canes. Walkers. Scooters. Crutches. Turn on the lights when you go into a dark area. Replace any light bulbs as soon as they burn out. Set up your furniture so you have a clear path. Avoid moving your furniture around. If any of your floors are uneven, fix them. If there are any pets around you, be aware of where they are. Review your medicines with your doctor. Some medicines can make you feel dizzy. This can increase your chance of falling. Ask your doctor what other things that you can do to help prevent falls. This information is not intended to replace advice given to you by your health care provider. Make sure you discuss any questions you have with your health care provider. Document Released: 09/23/2009 Document Revised: 05/04/2016 Document Reviewed: 01/01/2015 Elsevier Interactive Patient Education  2017 [employer].

## 2021-05-26 NOTE — Progress Notes (Signed)
I connected with Andrea Anthony today by telephone and verified that I am speaking with the correct person using two identifiers. Location patient: home Location provider: work Persons participating in the virtual visit: Mudlogger, Diplomatic Services operational officer (daughter) and Lisette Abu, LPN.   I discussed the limitations, risks, security and privacy concerns of performing an evaluation and management service by telephone and the availability of in person appointments. I also discussed with the patient that there may be a patient responsible charge related to this service. The patient expressed understanding and verbally consented to this telephonic visit.    Interactive audio and video telecommunications were attempted between this provider and patient, however failed, due to patient having technical difficulties OR patient did not have access to video capability.  We continued and completed visit with audio only.  Some vital signs may be absent or patient reported.   Time Spent with patient on telephone encounter: 30 minutes  Subjective:   Andrea Anthony is a 75 y.o. female who presents for Medicare Annual (Subsequent) preventive examination.  Review of Systems     Cardiac Risk Factors include: advanced age (>52mn, >>48women);diabetes mellitus;dyslipidemia;hypertension;family history of premature cardiovascular disease     Objective:    There were no vitals filed for this visit. There is no height or weight on file to calculate BMI.  Advanced Directives 05/26/2021 04/28/2020  Does Patient Have a Medical Advance Directive? No Yes  Type of Advance Directive - Living will;Healthcare Power of Attorney  Does patient want to make changes to medical advance directive? - No - Patient declined  [place] of [place]? - No - copy requested  Would patient like information on creating a medical advance directive? No - Patient declined -    Current Medications  (verified) Outpatient Encounter Medications as of 05/26/2021  Medication Sig   amLODipine (NORVASC) 5 MG tablet TAKE 1 TABLET  BY MOUTH DAILY * MUDST KEEP ANNUAL APPOINTMENT FOR REFILLS *   aspirin 81 MG tablet Take 81 mg by mouth daily.   blood glucose meter kit and supplies KIT Dispense based on patient and insurance preference. Use up to four times daily as directed E11.9   dapagliflozin propanediol (FARXIGA) 10 MG TABS tablet Take 1 tablet (10 mg total) by mouth daily.   gabapentin (NEURONTIN) 100 MG capsule Take 1 capsule (100 mg total) by mouth 3 (three) times daily.   HYDROcodone-acetaminophen (NORCO/VICODIN) 5-325 MG tablet Take 1 tablet by mouth every 6 (six) hours as needed.   irbesartan (AVAPRO) 300 MG tablet TAKE ONE TABLET BY MOUTH DAILY   meloxicam (MOBIC) 7.5 MG tablet Take 1 tablet (7.5 mg total) by mouth daily.   metFORMIN (GLUCOPHAGE) 500 MG tablet TAKE 2 TABLETS BY MOUTH TWICE A DAY WITH A MEAL   nebivolol (BYSTOLIC) 5 MG tablet TAKE ONE TABLET BY MOUTH DAILY   ONETOUCH DELICA LANCETS 316BMISC Use to help check blood sugars three times a day Dx E11.9   pravastatin (PRAVACHOL) 40 MG tablet TAKE ONE TABLET BY MOUTH DAILY   tiZANidine (ZANAFLEX) 2 MG tablet Take 1 tablet (2 mg total) by mouth every 6 (six) hours as needed for muscle spasms.   traMADol (ULTRAM) 50 MG tablet Take 1 tablet (50 mg total) by mouth every 8 (eight) hours as needed.   No facility-administered encounter medications on file as of 05/26/2021.    Allergies (verified) Ace inhibitors and Sulfonamide derivatives   History: Past Medical History:  Diagnosis Date  ALLERGIC RHINITIS 01/07/2008   Qualifier: Diagnosis of  By: Jenny Reichmann MD, Hunt Oris    [facility] DEFICIENCY 01/07/2008   Qualifier: Diagnosis of  By: Jenny Reichmann MD, Hunt Oris    DIABETES MELLITUS, TYPE II 06/24/2007   Qualifier: Diagnosis of  By: Jenny Reichmann MD, [place], COLON 01/07/2008   Qualifier: Diagnosis of  By: Jenny Reichmann MD, Hunt Oris    Empty  sella ([place]) 03/17/2013   GERD 05/24/2009   Qualifier: Diagnosis of  By: Jenny Reichmann MD, Hunt Oris    HYPERLIPIDEMIA 06/01/2008   Qualifier: Diagnosis of  By: Jenny Reichmann MD, Hunt Oris    HYPERTENSION 06/24/2007   Qualifier: Diagnosis of  By: Jenny Reichmann MD, Hunt Oris    Lumbar disc disease 10/08/2012   l4-5 per CT 2008   MALT lymphoma ([place]) 06/08/2012   Gastric, 2001   PULMONARY NODULE, RIGHT LOWER LOBE 01/09/2008   Qualifier: Diagnosis of  By: Jenny Reichmann MD, Hunt Oris    Umbilical hernia 42/68/3419   Fat containing on CT 2008   Past Surgical History:  Procedure Laterality Date   NO PAST SURGERIES     UPPER GASTROINTESTINAL ENDOSCOPY     Family History  Problem Relation Age of Onset   Hypertension Father    Colon cancer Neg Hx    Social History   Socioeconomic History   Marital status: Married    Spouse name: Not on file   Number of children: Not on file   Years of education: Not on file   Highest education level: Not on file  Occupational History   Not on file  Tobacco Use   Smoking status: Never   Smokeless tobacco: Never  Substance and Sexual Activity   Alcohol use: No   Drug use: No   Sexual activity: Not on file  Other Topics Concern   Not on file  Social History Narrative   Not on file   Social Determinants of Health   Financial Resource Strain: Low Risk    Difficulty of Paying Living Expenses: Not hard at all  Food Insecurity: No Food Insecurity   Worried About Running Out of Food in the Last Year: Never true   [place] in the Last Year: Never true  Transportation Needs: No Transportation Needs   Lack of Transportation (Medical): No   Lack of Transportation (Non-Medical): No  Physical Activity: Sufficiently Active   Days of Exercise per Week: 5 days   Minutes of Exercise per Session: 30 min  Stress: No Stress Concern Present   Feeling of Stress : Not at all  Social Connections: Socially Integrated   Frequency of Communication with Friends and Family: More than three times a week    Frequency of Social Gatherings with Friends and Family: More than three times a week   Attends Religious Services: More than 4 times per year   Active Member of [employer] or Organizations: No   Attends Music therapist: More than 4 times per year   Marital Status: Married    Tobacco Counseling Counseling given: Not Answered   Clinical Intake:  Pre-visit preparation completed: Yes  Pain : No/denies pain     Nutritional Risks: None Diabetes: Yes CBG done?: No CBG resulted in Enter/ Edit results?: No Did pt. bring in CBG monitor from home?: No  How often do you need to have someone help you when you read instructions, pamphlets, or other written materials from your doctor or pharmacy?: 1 - Never What is the  last grade level you completed in school?: Middle school  Diabetic? yes  Interpreter Needed?: No  Information entered by :: Lisette Abu, LPN   Activities of Daily Living In your present state of health, do you have any difficulty performing the following activities: 05/26/2021 05/03/2021  Hearing? N N  Vision? N N  Difficulty concentrating or making decisions? N N  Walking or climbing stairs? N N  Dressing or bathing? N N  Doing errands, shopping? N N  Preparing Food and eating ? N -  Using the Toilet? N -  In the past six months, have you accidently leaked urine? N -  Do you have problems with loss of bowel control? N -  Managing your Medications? N -  Managing your Finances? N -  Housekeeping or managing your Housekeeping? N -  Some recent data might be hidden    Patient Care Team: Biagio Borg, MD as PCP - General  Indicate any recent Medical Services you may have received from other than [facility] providers in the past year (date may be approximate).     Assessment:   This is a routine wellness examination for Andrea Anthony.  Hearing/Vision screen No results found.  Dietary issues and exercise activities discussed: Current Exercise Habits:  Structured exercise class, Type of exercise: walking;treadmill;stretching;strength training/weights, Time (Minutes): 30, Frequency (Times/Week): 5, Weekly Exercise (Minutes/Week): 150, Intensity: Moderate, Exercise limited by: orthopedic condition(s)   Goals Addressed             This Visit's Progress    Patient Stated       To maintain my current health status by continuing to eat healthy, stay physically active and socially active.       Depression Screen PHQ 2/9 Scores 05/26/2021 05/03/2021 05/03/2021 04/22/2020 04/22/2020 09/12/2019 06/05/2018  PHQ - 2 Score 0 1 0 0 0 1 0    Fall Risk Fall Risk  05/26/2021 05/03/2021 05/03/2021 04/22/2020 04/22/2020  Falls in the past year? 0 0 1 0 1  Number falls in past yr: 0 0 0 0 0  Injury with Fall? 0 0 0 0 0  Risk for fall due to : - - - - History of fall(s)  Follow up Falls evaluation completed - - - Falls evaluation completed    FALL RISK PREVENTION PERTAINING TO THE HOME:  Any stairs in or around the home? Yes  If so, are there any without handrails? No  Home free of loose throw rugs in walkways, pet beds, electrical cords, etc? Yes  Adequate lighting in your home to reduce risk of falls? Yes   ASSISTIVE DEVICES UTILIZED TO PREVENT FALLS:  Life alert? No  Use of a cane, walker or w/c? No  Grab bars in the bathroom? Yes  Shower chair or bench in shower? No  Elevated toilet seat or a handicapped toilet? No   TIMED UP AND GO:  Was the test performed? No .  Length of time to ambulate 10 feet: 0 sec.   Gait steady and fast without use of assistive device  Cognitive Function: No flowsheet data found.         Immunizations Immunization History  Administered Date(s) Administered   Fluad Quad(high Dose 65+) 09/12/2019, 10/26/2020   H1N1 11/12/2008   Influenza Split 10/18/2010   Influenza Whole 01/07/2008   Influenza, High Dose Seasonal PF 11/09/2015, 10/03/2016, 10/23/2017, 11/20/2018   Influenza,inj,Quad PF,6+ Mos 09/16/2013    Pneumococcal Conjugate-13 09/16/2013   Pneumococcal Polysaccharide-23 05/24/2009, 06/05/2018   Td 05/24/2009  Tdap 09/12/2019   Zoster, Live 10/03/2016    TDAP status: Up to date  Flu Vaccine status: Up to date  Pneumococcal vaccine status: Up to date  Covid-19 vaccine status: Declined, Education has been provided regarding the importance of this vaccine but patient still declined. Advised may receive this vaccine at local pharmacy or Health Dept.or vaccine clinic. Aware to provide a copy of the vaccination record if obtained from local pharmacy or Health Dept. Verbalized acceptance and understanding.  Qualifies for Shingles Vaccine? Yes   Zostavax completed Yes   Shingrix Completed?: No.    Education has been provided regarding the importance of this vaccine. Patient has been advised to call insurance company to determine out of pocket expense if they have not yet received this vaccine. Advised may also receive vaccine at local pharmacy or Health Dept. Verbalized acceptance and understanding.  Screening Tests Health Maintenance  Topic Date Due   COVID-19 Vaccine (1) Never done   Zoster Vaccines- Shingrix (1 of 2) 08/08/2021 (Originally 05/23/1965)   COLONOSCOPY (Pts 45-10yr Insurance coverage will need to be confirmed)  06/06/2021   INFLUENZA VACCINE  07/11/2021   OPHTHALMOLOGY EXAM  08/11/2021   FOOT EXAM  10/26/2021   HEMOGLOBIN A1C  11/03/2021   MAMMOGRAM  11/23/2021   TETANUS/TDAP  09/11/2029   DEXA SCAN  Completed   Hepatitis C Screening  Completed   PNA vac Low Risk Adult  Completed   HPV VACCINES  Aged Out    Health Maintenance  Health Maintenance Due  Topic Date Due   COVID-19 Vaccine (1) Never done    Colorectal cancer screening: Type of screening: Colonoscopy. Completed 06/06/2016. Repeat every 5 years  Mammogram status: Completed 11/28/2019. Repeat every year  Bone Density status: Completed 05/28/2017. Results reflect: Bone density results: NORMAL. Repeat  every 0 years.  Lung Cancer Screening: (Low Dose CT Chest recommended if Age 74-80years, 30 pack-year currently smoking OR have quit w/in 15years.) does not qualify.   Lung Cancer Screening Referral: no  Additional Screening:  Hepatitis C Screening: does qualify; Completed yes  Vision Screening: Recommended annual ophthalmology exams for early detection of glaucoma and other disorders of the eye. Is the patient up to date with their annual eye exam?  Yes  Who is the provider or what is the name of the office in which the patient attends annual eye exams[facility] with a provider, would they like to be referred to a provider to establish care? No .   Dental Screening: Recommended annual dental exams for proper oral hygiene  Community Resource Referral / Chronic Care Management: CRR required this visit?  No   CCM required this visit?  No      Plan:     I have personally reviewed and noted the following in the patient's chart:   Medical and social history Use of alcohol, tobacco or illicit drugs  Current medications and supplements including opioid prescriptions.  Functional ability and status Nutritional status Physical activity Advanced directives List of other physicians Hospitalizations, surgeries, and ER visits in previous 12 months Vitals Screenings to include cognitive, depression, and falls Referrals and appointments  In addition, I have reviewed and discussed with patient certain preventive protocols, quality metrics, and best practice recommendations. A written personalized care plan for preventive services as well as general preventive health recommendations were provided to patient.     SSheral Flow LPN   60/08/2329  Nurse Notes:  Patient  is cogitatively intact. There were no vitals filed for this visit. There is no height or weight on file to calculate BMI. Patient stated that she has no issues with gait or  balance; does not use any assistive devices. Medications reviewed with patient; yes opioid use noted.

## 2021-06-09 ENCOUNTER — Encounter: Payer: Self-pay | Admitting: Internal Medicine

## 2021-06-24 ENCOUNTER — Telehealth: Payer: Self-pay | Admitting: *Deleted

## 2021-06-24 NOTE — Chronic Care Management (AMB) (Signed)
  Chronic Care Management   Note  06/24/2021 Name: Andrea Anthony MRN: [id number] DOB: [birth year]  Andrea Anthony is a 75 y.o. year old female who is a primary care patient of Biagio Borg, MD. I reached out to [employer] by phone today in response to a referral sent by Andrea Anthony's PCP Biagio Borg, MD     Andrea Anthony was given information about Chronic Care Management services today including:  CCM service includes personalized support from designated clinical staff supervised by her physician, including individualized plan of care and coordination with other care providers 24/7 contact phone numbers for assistance for urgent and routine care needs. Service will only be billed when office clinical staff spend 20 minutes or more in a month to coordinate care. Only one practitioner may furnish and bill the service in a calendar month. The patient may stop CCM services at any time (effective at the end of the month) by phone call to the office staff. The patient will be responsible for cost sharing (co-pay) of up to 20% of the service fee (after annual deductible is met).  Patient did not agree to enrollment in care management services and does not wish to consider at this time.  Follow up plan: Patient declines further follow up and engagement by the care management team. Appropriate care team members and provider have been notified via electronic communication.   Julian Hy, [place] Management  Direct Dial: [phone]

## 2021-07-06 DIAGNOSIS — H04123 Dry eye syndrome of bilateral lacrimal glands: Secondary | ICD-10-CM | POA: Diagnosis not present

## 2021-07-06 DIAGNOSIS — H25813 Combined forms of age-related cataract, bilateral: Secondary | ICD-10-CM | POA: Diagnosis not present

## 2021-07-06 DIAGNOSIS — E119 Type 2 diabetes mellitus without complications: Secondary | ICD-10-CM | POA: Diagnosis not present

## 2021-07-06 DIAGNOSIS — H43393 Other vitreous opacities, bilateral: Secondary | ICD-10-CM | POA: Diagnosis not present

## 2021-09-01 DIAGNOSIS — H04123 Dry eye syndrome of bilateral lacrimal glands: Secondary | ICD-10-CM | POA: Diagnosis not present

## 2021-09-01 DIAGNOSIS — H43393 Other vitreous opacities, bilateral: Secondary | ICD-10-CM | POA: Diagnosis not present

## 2021-09-01 DIAGNOSIS — E113291 Type 2 diabetes mellitus with mild nonproliferative diabetic retinopathy without macular edema, right eye: Secondary | ICD-10-CM | POA: Diagnosis not present

## 2021-09-01 DIAGNOSIS — H25813 Combined forms of age-related cataract, bilateral: Secondary | ICD-10-CM | POA: Diagnosis not present

## 2021-10-24 ENCOUNTER — Other Ambulatory Visit: Payer: Self-pay | Admitting: Internal Medicine

## 2021-10-24 DIAGNOSIS — I10 Essential (primary) hypertension: Secondary | ICD-10-CM

## 2021-10-24 NOTE — Telephone Encounter (Signed)
Ok for all EXCEPT farxiga and glipizide  Please refill as per office routine med refill policy (all routine meds to be refilled for 3 mo or monthly (per pt preference) up to one year from last visit, then month to month grace period for 3 mo, then further med refills will have to be denied)

## 2021-11-01 ENCOUNTER — Other Ambulatory Visit: Payer: Self-pay | Admitting: Internal Medicine

## 2021-11-07 ENCOUNTER — Ambulatory Visit: Payer: Medicare Other | Admitting: Internal Medicine

## 2021-11-16 ENCOUNTER — Ambulatory Visit (INDEPENDENT_AMBULATORY_CARE_PROVIDER_SITE_OTHER): Payer: Medicare Other | Admitting: Internal Medicine

## 2021-11-16 ENCOUNTER — Other Ambulatory Visit: Payer: Self-pay

## 2021-11-16 ENCOUNTER — Encounter: Payer: Self-pay | Admitting: Internal Medicine

## 2021-11-16 VITALS — BP 120/82 | HR 61 | Temp 99.0°F | Ht 67.0 in | Wt 196.0 lb

## 2021-11-16 DIAGNOSIS — E559 Vitamin D deficiency, unspecified: Secondary | ICD-10-CM | POA: Diagnosis not present

## 2021-11-16 DIAGNOSIS — I10 Essential (primary) hypertension: Secondary | ICD-10-CM | POA: Diagnosis not present

## 2021-11-16 DIAGNOSIS — E118 Type 2 diabetes mellitus with unspecified complications: Secondary | ICD-10-CM | POA: Diagnosis not present

## 2021-11-16 DIAGNOSIS — E7800 Pure hypercholesterolemia, unspecified: Secondary | ICD-10-CM

## 2021-11-16 DIAGNOSIS — Z86010 Personal history of colonic polyps: Secondary | ICD-10-CM

## 2021-11-16 DIAGNOSIS — M519 Unspecified thoracic, thoracolumbar and lumbosacral intervertebral disc disorder: Secondary | ICD-10-CM | POA: Diagnosis not present

## 2021-11-16 DIAGNOSIS — E538 Deficiency of other specified B group vitamins: Secondary | ICD-10-CM | POA: Diagnosis not present

## 2021-11-16 DIAGNOSIS — Z23 Encounter for immunization: Secondary | ICD-10-CM | POA: Diagnosis not present

## 2021-11-16 MED ORDER — DAPAGLIFLOZIN PROPANEDIOL 10 MG PO TABS: 10.0000 mg | ORAL_TABLET | Freq: Every day | ORAL | 3 refills | Status: DC

## 2021-11-16 MED ORDER — METFORMIN HCL ER 500 MG PO TB24: 2000.0000 mg | ORAL_TABLET | Freq: Every day | ORAL | 3 refills | Status: DC

## 2021-11-16 MED ORDER — METFORMIN HCL 500 MG PO TABS: ORAL_TABLET | ORAL | 3 refills | Status: DC

## 2021-11-16 NOTE — Patient Instructions (Addendum)
You had the flu shot today  Plesae consider the Novavax covid vaccine when available  You will be contacted regarding the referral for: colonoscopy  Ok to change the metformin to the metformin ER 500 mg at 4 pills in the AM  [facility] on taking the [place] for now (no farxiga for now)  Please continue all other medications as before, and refills have been done if requested.  Please have the pharmacy call with any other refills you may need.  Please continue your efforts at being more active, low cholesterol diet, and weight control.  Please keep your appointments with your specialists as you may have planned  Please make an Appointment to return in 6 months, or sooner if needed, also with Lab Appointment for testing done 3-5 days before at the [facility] (so this is for TWO appointments - please see the scheduling desk as you leave)  Due to the ongoing Covid 19 pandemic, our lab now requires an appointment for any labs done at our office.  If you need labs done and do not have an appointment, please call our office ahead of time to schedule before presenting to the lab for your testing.

## 2021-11-16 NOTE — Progress Notes (Signed)
Patient ID: Andrea Anthony, female   DOB: 1946/04/07, 75 y.o.   MRN: [id number]        Chief Complaint: follow up HTN, HLD and hyperglycemia, low back pain       HPI:  Andrea Anthony is a 75 y.o. female here overall doing ok with daughter to interpret; Pt denies chest pain, increased sob or doe, wheezing, orthopnea, PND, increased LE swelling, palpitations, dizziness or syncope.   Pt denies polydipsia, polyuria, or low sugar symptoms.  Admits to taking meds as she wants prn sometimes in fear of low sugars.  Not taking [place].  Pt continues to have recurring LBP without change in severity, bowel or bladder change, fever, wt loss,  worsening LE pain/numbness/weakness, gait change or falls, except for numbness to both legs with first getting up in the AM for 10-20 min then resolves the rest of the day.  No longer doing her 90 min at the gym daily.  [place] [facility]     Due for flu shot and f/u screening colonoscopy Wt Readings from Last 3 Encounters:  11/16/21 196 lb (88.9 kg)  05/03/21 193 lb (87.5 kg)  10/26/20 196 lb (88.9 kg)   BP Readings from Last 3 Encounters:  11/16/21 120/82  05/03/21 118/70  10/26/20 120/70         Past Medical History:  Diagnosis Date   ALLERGIC RHINITIS 01/07/2008   Qualifier: Diagnosis of  By: Jenny Reichmann MD, Hunt Oris    [facility] DEFICIENCY 01/07/2008   Qualifier: Diagnosis of  By: Jenny Reichmann MD, Hunt Oris    DIABETES MELLITUS, TYPE II 06/24/2007   Qualifier: Diagnosis of  By: Jenny Reichmann MD, [place], COLON 01/07/2008   Qualifier: Diagnosis of  By: Jenny Reichmann MD, Hunt Oris    Empty sella ([place]) 03/17/2013   GERD 05/24/2009   Qualifier: Diagnosis of  By: Jenny Reichmann MD, Hunt Oris    HYPERLIPIDEMIA 06/01/2008   Qualifier: Diagnosis of  By: Jenny Reichmann MD, Hunt Oris    HYPERTENSION 06/24/2007   Qualifier: Diagnosis of  By: Jenny Reichmann MD, Hunt Oris    Lumbar disc disease 10/08/2012   l4-5 per CT 2008   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 She has never used smokeless tobacco. She reports that she does not drink alcohol and does not use drugs. family history includes Hypertension in her father. Allergies  Allergen Reactions   Ace Inhibitors     REACTION: cough   Sulfonamide Derivatives     Not sure of reaction   Current Outpatient Medications on File Prior to Visit  Medication Sig Dispense Refill   amLODipine (NORVASC) 5 MG tablet TAKE 1 TABLET BY MOUTH DAILY **MUST KEEP ANNUAL APPOINTMENT FOR MORE REFILLS** 30 tablet 0   aspirin 81 MG tablet Take 81 mg by mouth daily.     blood glucose meter kit and supplies KIT Dispense based on patient and insurance preference. Use up to four times daily as directed E11.9 1 each 0   gabapentin (NEURONTIN) 100 MG capsule Take 1 capsule (100 mg total) by mouth 3 (three) times daily. 90 capsule 5  HYDROcodone-acetaminophen (NORCO/VICODIN) 5-325 MG tablet Take 1 tablet by mouth every 6 (six) hours as needed. 30 tablet 0   irbesartan (AVAPRO) 300 MG tablet TAKE ONE TABLET BY MOUTH DAILY 30 tablet 0   meloxicam (MOBIC) 7.5 MG tablet Take 1 tablet (7.5 mg total) by mouth daily. 30 tablet 1   nebivolol (BYSTOLIC) 5 MG tablet TAKE ONE TABLET BY MOUTH DAILY 30 tablet 0   ONETOUCH DELICA LANCETS 12X MISC Use to help check blood sugars three times a day Dx E11.9 100 each 11   pravastatin (PRAVACHOL) 40 MG tablet TAKE ONE TABLET BY MOUTH DAILY 30 tablet 0   tiZANidine (ZANAFLEX) 2 MG tablet Take 1 tablet (2 mg total) by mouth every 6 (six) hours as needed for muscle spasms. 30 tablet 1   traMADol (ULTRAM) 50 MG tablet Take 1 tablet (50 mg total) by mouth every 8 (eight) hours as needed. 60 tablet 0   No current facility-administered medications  on file prior to visit.        ROS:  All others reviewed and negative.  Objective        PE:  BP 120/82 (BP Location: Right Arm, Patient Position: Sitting, Cuff Size: Large)   Pulse 61   Temp 99 F (37.2 C) (Oral)   Ht _0  (1.702 m)   Wt 196 lb (88.9 kg)   SpO2 98%   BMI 30.70 kg/m                 Constitutional: Pt appears in NAD               HENT: Head: NCAT.                Right Ear: External ear normal.                 Left Ear: External ear normal.                Eyes: . Pupils are equal, round, and reactive to light. Conjunctivae and EOM are normal               Nose: without d/c or deformity               Neck: Neck supple. Gross normal ROM               Cardiovascular: Normal rate and regular rhythm.                 Pulmonary/Chest: Effort normal and breath sounds without rales or wheezing.                Abd:  Soft, NT, ND, + BS, no organomegaly               Neurological: Pt is alert. At baseline orientation, motor grossly intact               Skin: Skin is warm. No rashes, no other new lesions, LE edema - none               Psychiatric: Pt behavior is normal without agitation   Micro: none  Cardiac tracings I have personally interpreted today:  none  Pertinent Radiological findings (summarize): none   Lab Results  Component Value Date   WBC 7.7 05/03/2021   HGB 14.0 05/03/2021   HCT 42.4 05/03/2021   PLT 226.0 05/03/2021   GLUCOSE 181 (H) 05/03/2021   CHOL 167 05/03/2021   TRIG 151.0 (H) 05/03/2021  HDL 49.30 05/03/2021   LDLCALC 87 05/03/2021   ALT 23 05/03/2021   AST 19 05/03/2021   NA 138 05/03/2021   K 4.3 05/03/2021   CL 102 05/03/2021   CREATININE 1.02 05/03/2021   BUN 19 05/03/2021   CO2 27 05/03/2021   TSH 1.54 05/03/2021   HGBA1C 8.6 (H) 05/03/2021   MICROALBUR <0.7 05/03/2021   Assessment/Plan:  Andrea Anthony is a 75 y.o. Black or African American [2] female with  has a past medical history of ALLERGIC RHINITIS (01/07/2008),  ANEMIA-IRON DEFICIENCY (01/07/2008), DIABETES MELLITUS, TYPE II (06/24/2007), DIVERTICULOSIS, COLON (01/07/2008), Empty sella ([place]) (03/17/2013), GERD (05/24/2009), HYPERLIPIDEMIA (06/01/2008), HYPERTENSION (06/24/2007), Lumbar disc disease (10/08/2012), MALT lymphoma ([place]) (06/08/2012), PULMONARY NODULE, RIGHT LOWER LOBE (7/79/3968), and Umbilical hernia (86/48/4720).  Type II diabetes mellitus with manifestations ([place]) Lab Results  Component Value Date   HGBA1C 8.6 (H) 05/03/2021   Uncontrolled in part due to noncompliance with med, pt to continue current medical treatment metformin as rx   Hyperlipidemia Lab Results  Component Value Date   [place] 87 05/03/2021   Uncontrolled, goal ldl < 70 pt to continue current statin pravachol and encouraged compliance with med and diet   Essential hypertension BP Readings from Last 3 Encounters:  11/16/21 120/82  05/03/21 118/70  10/26/20 120/70   Stable, pt to continue medical treatment norvasc, avapro   Lumbar disc disease With recent worsening bilateral LE paresthesias for a short time each am on getting up, declines surgical referral or MRI for now  Followup: No follow-ups on file.  Cathlean Cower, MD 11/20/2021 8:31 PM [facility]

## 2021-11-20 NOTE — Assessment & Plan Note (Signed)
Lab Results  Component Value Date   HGBA1C 8.6 (H) 05/03/2021   Uncontrolled in part due to noncompliance with med, pt to continue current medical treatment metformin as rx

## 2021-11-20 NOTE — Assessment & Plan Note (Signed)
Lab Results  Component Value Date   LDLCALC 87 05/03/2021   Uncontrolled, goal ldl < 70 pt to continue current statin pravachol and encouraged compliance with med and diet

## 2021-11-20 NOTE — Assessment & Plan Note (Signed)
With recent worsening bilateral LE paresthesias for a short time each am on getting up, declines surgical referral or MRI for now

## 2021-11-20 NOTE — Assessment & Plan Note (Signed)
BP Readings from Last 3 Encounters:  11/16/21 120/82  05/03/21 118/70  10/26/20 120/70   Stable, pt to continue medical treatment norvasc, avapro

## 2021-11-23 ENCOUNTER — Other Ambulatory Visit: Payer: Self-pay | Admitting: Internal Medicine

## 2021-11-23 DIAGNOSIS — I10 Essential (primary) hypertension: Secondary | ICD-10-CM

## 2021-11-23 NOTE — Telephone Encounter (Signed)
Please refill as per office routine med refill policy (all routine meds to be refilled for 3 mo or monthly (per pt preference) up to one year from last visit, then month to month grace period for 3 mo, then further med refills will have to be denied) ? ?

## 2021-12-22 ENCOUNTER — Other Ambulatory Visit: Payer: Self-pay | Admitting: Internal Medicine

## 2021-12-22 DIAGNOSIS — I10 Essential (primary) hypertension: Secondary | ICD-10-CM

## 2021-12-22 NOTE — Telephone Encounter (Signed)
Please refill as per office routine med refill policy (all routine meds to be refilled for 3 mo or monthly (per pt preference) up to one year from last visit, then month to month grace period for 3 mo, then further med refills will have to be denied) ? ?

## 2022-01-13 ENCOUNTER — Other Ambulatory Visit: Payer: Self-pay | Admitting: Internal Medicine

## 2022-03-20 ENCOUNTER — Encounter: Payer: Self-pay | Admitting: Internal Medicine

## 2022-05-17 ENCOUNTER — Encounter: Payer: Self-pay | Admitting: Internal Medicine

## 2022-05-17 ENCOUNTER — Ambulatory Visit (INDEPENDENT_AMBULATORY_CARE_PROVIDER_SITE_OTHER): Payer: Medicare Other | Admitting: Internal Medicine

## 2022-05-17 VITALS — BP 122/68 | HR 64 | Temp 98.0°F | Ht 67.0 in | Wt 186.0 lb

## 2022-05-17 DIAGNOSIS — D508 Other iron deficiency anemias: Secondary | ICD-10-CM | POA: Diagnosis not present

## 2022-05-17 DIAGNOSIS — E118 Type 2 diabetes mellitus with unspecified complications: Secondary | ICD-10-CM | POA: Diagnosis not present

## 2022-05-17 DIAGNOSIS — K5900 Constipation, unspecified: Secondary | ICD-10-CM

## 2022-05-17 DIAGNOSIS — I10 Essential (primary) hypertension: Secondary | ICD-10-CM | POA: Diagnosis not present

## 2022-05-17 DIAGNOSIS — E7800 Pure hypercholesterolemia, unspecified: Secondary | ICD-10-CM | POA: Diagnosis not present

## 2022-05-17 DIAGNOSIS — Z1231 Encounter for screening mammogram for malignant neoplasm of breast: Secondary | ICD-10-CM

## 2022-05-17 DIAGNOSIS — Z0001 Encounter for general adult medical examination with abnormal findings: Secondary | ICD-10-CM | POA: Diagnosis not present

## 2022-05-17 DIAGNOSIS — G4762 Sleep related leg cramps: Secondary | ICD-10-CM

## 2022-05-17 DIAGNOSIS — M79604 Pain in right leg: Secondary | ICD-10-CM

## 2022-05-17 LAB — MAGNESIUM: Magnesium: 2.1 mg/dL (ref 1.5–2.5)

## 2022-05-17 MED ORDER — PRAVASTATIN SODIUM 40 MG PO TABS: 40.0000 mg | ORAL_TABLET | Freq: Every day | ORAL | 3 refills | Status: DC

## 2022-05-17 MED ORDER — GABAPENTIN 100 MG PO CAPS
100.0000 mg | ORAL_CAPSULE | Freq: Three times a day (TID) | ORAL | 5 refills | Status: DC
Start: 2022-05-17 — End: 2023-04-24

## 2022-05-17 MED ORDER — TIZANIDINE HCL 2 MG PO TABS: ORAL_TABLET | ORAL | 2 refills | Status: AC

## 2022-05-17 MED ORDER — NEBIVOLOL HCL 5 MG PO TABS: 5.0000 mg | ORAL_TABLET | Freq: Every day | ORAL | 3 refills | Status: DC

## 2022-05-17 MED ORDER — IRBESARTAN 300 MG PO TABS
300.0000 mg | ORAL_TABLET | Freq: Every day | ORAL | 3 refills | Status: DC
Start: 2022-05-17 — End: 2023-04-24

## 2022-05-17 MED ORDER — DAPAGLIFLOZIN PROPANEDIOL 10 MG PO TABS: 10.0000 mg | ORAL_TABLET | Freq: Every day | ORAL | 3 refills | Status: DC

## 2022-05-17 MED ORDER — METFORMIN HCL ER 500 MG PO TB24
2000.0000 mg | ORAL_TABLET | Freq: Every day | ORAL | 3 refills | Status: DC
Start: 2022-05-17 — End: 2023-04-24

## 2022-05-17 MED ORDER — AMLODIPINE BESYLATE 5 MG PO TABS
ORAL_TABLET | ORAL | 3 refills | Status: DC
Start: 2022-05-17 — End: 2023-04-24

## 2022-05-17 NOTE — Assessment & Plan Note (Addendum)
Lab Results  Component Value Date   HGBA1C 8.6 (H) 05/03/2021   uncontrolled, pt to continue current medical treatment metformin ER 500- 4 tab qd, will need f/u a1c today

## 2022-05-17 NOTE — Patient Instructions (Addendum)
Please have your Shingrix shingles shot done at your local pharmacy  Please call for the colonoscopy as you mentioned, as it is due  You will be contacted regarding the referral for: mammogram  Ok to use the OTC Miralax once daily, and Colace (stool softer) twice per day for constipation  Please continue all other medications as before, and refills have been done if requested.  Please have the pharmacy call with any other refills you may need.  Please continue your efforts at being more active, low cholesterol diet, and weight control.  You are otherwise up to date with prevention measures today.  Please keep your appointments with your specialists as you may have planned  You will be contacted regarding the referral for: leg circulation testing  Please go to the LAB at the blood drawing area for the tests to be done  You will be contacted by phone if any changes need to be made immediately.  Otherwise, you will receive a letter about your results with an explanation, but please check with MyChart first.  Please remember to sign up for MyChart if you have not done so, as this will be important to you in the future with finding out test results, communicating by private email, and scheduling acute appointments online when needed.  Please make an Appointment to return in 6 months, or sooner if needed

## 2022-05-17 NOTE — Progress Notes (Signed)
Patient ID: Andrea Anthony, female   DOB: [birth year], 76 y.o.   MRN: [id number]         Chief Complaint:: wellness exam and Follow-up  DM, hld, htn, iron deficiency       HPI:  Andrea Anthony is a 76 y.o. female here for wellness exam; plans to have shingrix at local pharmacy, daughter states they have recall letter and will call for f/u colonoscopy scheduling soon, due for mammogram, o/w up to date                        Also some mornings she just doesn't feel well but cant be otherwise specific it seems, usually gets up around 8, then takes am meds about 10 am.  Lost wt with better diet ? Vs uncontrolled sugar.  Pt states cbg sometimes labile, from 40s to 160s.  Gets sholer pain with lying on one side too long at night.  Does get leg cramps overnight.  Pt denies chest pain, increased sob or doe, wheezing, orthopnea, PND, increased LE swelling, palpitations, dizziness or syncope.   Pt denies polydipsia, polyuria, or new focal neuro s/s.    Pt denies fever, night sweats, loss of appetite, or other constitutional symptoms, though has lost several labs in the past yr   Denies worsening reflux, abd pain, dysphagia, n/v, or blood, but has ongoing constipation and not taking miralax every day as before.  Also has leg cramps at night to both legs but more often to the right maybe, and also more leg pain with ambulation x 2-3 mo without specific low back pain or radicular symptoms.   Wt Readings from Last 3 Encounters:  05/17/22 186 lb (84.4 kg)  11/16/21 196 lb (88.9 kg)  05/03/21 193 lb (87.5 kg)   BP Readings from Last 3 Encounters:  05/17/22 122/68  11/16/21 120/82  05/03/21 118/70   Immunization History  Administered Date(s) Administered   Fluad Quad(high Dose 65+) 09/12/2019, 10/26/2020, 11/16/2021   H1N1 11/12/2008   Influenza Split 10/18/2010   Influenza Whole 01/07/2008   Influenza, High Dose Seasonal PF 11/09/2015, 10/03/2016, 10/23/2017, 11/20/2018   Influenza,inj,Quad PF,6+ Mos  09/16/2013   Pneumococcal Conjugate-13 09/16/2013   Pneumococcal Polysaccharide-23 05/24/2009, 06/05/2018   Td 05/24/2009   Tdap 09/12/2019   Zoster, Live 10/03/2016   Health Maintenance Due  Topic Date Due   Zoster Vaccines- Shingrix (1 of 2) Never done   COLONOSCOPY (Pts 45-82yr Insurance coverage will need to be confirmed)  06/06/2021   MAMMOGRAM  11/23/2021      Past Medical History:  Diagnosis Date   ALLERGIC RHINITIS 01/07/2008   Qualifier: Diagnosis of  By: JJenny ReichmannMD, JHunt Oris   [facility]   Qualifier: Diagnosis of  By: JJenny ReichmannMD, JHunt Oris   DIABETES MELLITUS, TYPE II 06/24/2007   Qualifier: Diagnosis of  By: JJenny ReichmannMD, JIrwin COLON 01/07/2008   Qualifier: Diagnosis of  By: JJenny ReichmannMD, JHunt Oris   [place] 03/17[place]   GERD 05/24/2009   Qualifier: Diagnosis of  By: JJenny ReichmannMD, JHunt Oris   HYPERLIPIDEMIA 06/01/2008   Qualifier: Diagnosis of  By: JJenny ReichmannMD, JHunt Oris   HYPERTENSION 06/24/2007   Qualifier: Diagnosis of  By: JJenny ReichmannMD, JHunt Oris   Lumbar disc disease 10/08/2012   l4-5 per CT 2008   [place] 06/08/2012   Gastric, 2001   PULMONARY NODULE, RIGHT LOWER  LOBE 01/09/2008   Qualifier: Diagnosis of  By: Jenny Reichmann MD, Hunt Oris    Umbilical hernia 62/95/2841   Fat containing on CT 2008   Past Surgical History:  Procedure Laterality Date   NO PAST SURGERIES     UPPER GASTROINTESTINAL ENDOSCOPY      reports that she has never smoked. She has never used smokeless tobacco. She reports that she does not drink alcohol and does not use drugs. family history includes Hypertension in her father. Allergies  Allergen Reactions   Ace Inhibitors     REACTION: cough   Sulfonamide Derivatives     Not sure of reaction   Current Outpatient Medications on File Prior to Visit  Medication Sig Dispense Refill   aspirin 81 MG tablet Take 81 mg by mouth daily.     blood glucose meter kit and supplies KIT Dispense based on patient and insurance  preference. Use up to four times daily as directed E11.9 1 each 0   HYDROcodone-acetaminophen (NORCO/VICODIN) 5-325 MG tablet Take 1 tablet by mouth every 6 (six) hours as needed. 30 tablet 0   meloxicam (MOBIC) 7.5 MG tablet Take 1 tablet (7.5 mg total) by mouth daily. 30 tablet 1   ONETOUCH DELICA LANCETS 32G MISC Use to help check blood sugars three times a day Dx E11.9 100 each 11   No current facility-administered medications on file prior to visit.        ROS:  All others reviewed and negative.  Objective        PE:  BP 122/68 (BP Location: Right Arm, Patient Position: Sitting, Cuff Size: Large)   Pulse 64   Temp 98 F (36.7 C) (Oral)   Ht '5\' 7"'  (1.702 m)   Wt 186 lb (84.4 kg)   SpO2 97%   BMI 29.13 kg/m                 Constitutional: Pt appears in NAD               HENT: Head: NCAT.                Right Ear: External ear normal.                 Left Ear: External ear normal.                Eyes: . Pupils are equal, round, and reactive to light. Conjunctivae and EOM are normal               Nose: without d/c or deformity               Neck: Neck supple. Gross normal ROM               Cardiovascular: Normal rate and regular rhythm.                 Pulmonary/Chest: Effort normal and breath sounds without rales or wheezing.                Abd:  Soft, NT, ND, + BS, no organomegaly               Neurological: Pt is alert. At baseline orientation, motor grossly intact               Skin: Skin is warm. No rashes, no other new lesions, LE edema - trace bilateral               Psychiatric: Pt  behavior is normal without agitation   Micro: none  Cardiac tracings I have personally interpreted today:  none  Pertinent Radiological findings (summarize): none   Lab Results  Component Value Date   WBC 8.5 05/18/2022   HGB 13.4 05/18/2022   HCT 40.7 05/18/2022   PLT 223.0 05/18/2022   GLUCOSE 160 (H) 05/18/2022   CHOL 132 05/18/2022   TRIG 149.0 05/18/2022   HDL 48.90  05/18/2022   LDLCALC 53 05/18/2022   ALT 20 05/18/2022   AST 18 05/18/2022   NA 137 05/18/2022   K 4.2 05/18/2022   CL 99 05/18/2022   CREATININE 1.09 05/18/2022   BUN 25 (H) 05/18/2022   CO2 26 05/18/2022   TSH 1.97 05/18/2022   HGBA1C 8.9 (H) 05/18/2022   MICROALBUR <0.7 05/18/2022   Assessment/Plan:  Andrea Anthony is a 76 y.o. 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  Type II diabetes mellitus with manifestations (HCC) Lab Results  Component Value Date   HGBA1C 8.6 (H) 05/03/2021   uncontrolled, pt to continue current medical treatment metformin ER 500- 4 tab qd, will need f/u a1c today   Encounter for well adult exam with abnormal findings Age and sex appropriate education and counseling updated with regular exercise and diet Referrals for preventative services - plans to have shingrix at local pharmacy, daughter states they have recall letter and will call for f/u colonoscopy scheduling soon, due for mammogram Immunizations addressed - none needed Smoking counseling  - none needed Evidence for depression or other mood disorder - none significant Most recent labs reviewed. I have personally reviewed and have noted: 1) the patient's medical and social history 2) The patient's current medications and supplements 3) The patient's height, weight, and BMI have been recorded in the chart   Iron deficiency anemia No overt bleeding recent, for f/u iron level today  Hyperlipidemia Lab Results  Component Value Date   LDLCALC 53 05/18/2022   Stable, pt to  continue current statin pravachol 40 mg qd   Essential hypertension BP Readings from Last 3 Encounters:  05/17/22 122/68  11/16/21 120/82  05/03/21 118/70   Stable, pt to continue medical treatment norvasc 5 qd, avapro 324 qd, and bystolic 5 mg qd   Right leg pain Etiology unclear, given risk factors and symptoms, will have LE arterial study,  to f/u any worsening symptoms or concerns  Nocturnal leg cramps Also for magnesium with labs, and qhs muscle relaxer prn,  to f/u any worsening symptoms or concerns  Constipation Pt encouraged to increase miralax to daily use, and colace 100 bid prn as well  Followup: Return in about 6 months (around 11/16/2022).  Cathlean Cower, MD 05/20/2022 3:56 PM [facility]

## 2022-05-18 ENCOUNTER — Other Ambulatory Visit (INDEPENDENT_AMBULATORY_CARE_PROVIDER_SITE_OTHER): Payer: Medicare Other

## 2022-05-18 DIAGNOSIS — E118 Type 2 diabetes mellitus with unspecified complications: Secondary | ICD-10-CM | POA: Diagnosis not present

## 2022-05-18 DIAGNOSIS — E559 Vitamin D deficiency, unspecified: Secondary | ICD-10-CM

## 2022-05-18 DIAGNOSIS — E538 Deficiency of other specified B group vitamins: Secondary | ICD-10-CM | POA: Diagnosis not present

## 2022-05-18 LAB — URINALYSIS, ROUTINE W REFLEX MICROSCOPIC
Bilirubin Urine: NEGATIVE
Hgb urine dipstick: NEGATIVE
Ketones, ur: NEGATIVE
Leukocytes,Ua: NEGATIVE
Nitrite: NEGATIVE
RBC / HPF: NONE SEEN (ref 0–?)
Specific Gravity, Urine: <=1.005 — AB (ref 1.000–1.030)
Total Protein, Urine: NEGATIVE
Urine Glucose: >=1000 — AB
Urobilinogen, UA: 0.2 (ref 0.0–1.0)
WBC, UA: NONE SEEN (ref 0–?)
pH: 6 (ref 5.0–8.0)

## 2022-05-18 LAB — TSH: TSH: 1.97 u[IU]/mL (ref 0.35–5.50)

## 2022-05-18 LAB — CBC WITH DIFFERENTIAL/PLATELET
Basophils Absolute: 0.1 10*3/uL (ref 0.0–0.1)
Basophils Relative: 1 % (ref 0.0–3.0)
Eosinophils Absolute: 0.3 10*3/uL (ref 0.0–0.7)
Eosinophils Relative: 3.3 % (ref 0.0–5.0)
HCT: 40.7 % (ref 36.0–46.0)
Hemoglobin: 13.4 g/dL (ref 12.0–15.0)
Lymphocytes Relative: 39.6 % (ref 12.0–46.0)
Lymphs Abs: 3.4 10*3/uL (ref 0.7–4.0)
MCHC: 32.9 g/dL (ref 30.0–36.0)
MCV: 87.6 fl (ref 78.0–100.0)
Monocytes Absolute: 0.6 10*3/uL (ref 0.1–1.0)
Monocytes Relative: 7.2 % (ref 3.0–12.0)
Neutro Abs: 4.2 10*3/uL (ref 1.4–7.7)
Neutrophils Relative %: 48.9 % (ref 43.0–77.0)
Platelets: 223 10*3/uL (ref 150.0–400.0)
RBC: 4.65 Mil/uL (ref 3.87–5.11)
RDW: 13.3 % (ref 11.5–15.5)
WBC: 8.5 10*3/uL (ref 4.0–10.5)

## 2022-05-18 LAB — HEPATIC FUNCTION PANEL
ALT: 20 U/L (ref 0–35)
AST: 18 U/L (ref 0–37)
Albumin: 4.1 g/dL (ref 3.5–5.2)
Alkaline Phosphatase: 83 U/L (ref 39–117)
Bilirubin, Direct: 0.1 mg/dL (ref 0.0–0.3)
Total Bilirubin: 0.4 mg/dL (ref 0.2–1.2)
Total Protein: 7 g/dL (ref 6.0–8.3)

## 2022-05-18 LAB — VITAMIN B12: Vitamin B-12: 728 pg/mL (ref 211–911)

## 2022-05-18 LAB — LIPID PANEL
Cholesterol: 132 mg/dL (ref 0–200)
HDL: 48.9 mg/dL (ref >39.00)
LDL Cholesterol: 53 mg/dL (ref 0–99)
NonHDL: 82.98
Total CHOL/HDL Ratio: 3
Triglycerides: 149 mg/dL (ref 0.0–149.0)
VLDL: 29.8 mg/dL (ref 0.0–40.0)

## 2022-05-18 LAB — VITAMIN D 25 HYDROXY (VIT D DEFICIENCY, FRACTURES): VITD: 55.52 ng/mL (ref 30.00–100.00)

## 2022-05-18 LAB — MICROALBUMIN / CREATININE URINE RATIO
Creatinine,U: 17.4 mg/dL
Microalb Creat Ratio: 4 mg/g (ref 0.0–30.0)
Microalb, Ur: <0.7 mg/dL (ref 0.0–1.9)

## 2022-05-18 LAB — BASIC METABOLIC PANEL
BUN: 25 mg/dL — ABNORMAL HIGH (ref 6–23)
CO2: 26 mEq/L (ref 19–32)
Calcium: 9.9 mg/dL (ref 8.4–10.5)
Chloride: 99 mEq/L (ref 96–112)
Creatinine, Ser: 1.09 mg/dL (ref 0.40–1.20)
GFR: 49.88 mL/min — ABNORMAL LOW (ref >60.00)
Glucose, Bld: 160 mg/dL — ABNORMAL HIGH (ref 70–99)
Potassium: 4.2 mEq/L (ref 3.5–5.1)
Sodium: 137 mEq/L (ref 135–145)

## 2022-05-18 LAB — HEMOGLOBIN A1C: Hgb A1c MFr Bld: 8.9 % — ABNORMAL HIGH (ref 4.6–6.5)

## 2022-05-20 ENCOUNTER — Encounter: Payer: Self-pay | Admitting: Internal Medicine

## 2022-05-20 DIAGNOSIS — M79604 Pain in right leg: Secondary | ICD-10-CM | POA: Insufficient documentation

## 2022-05-20 DIAGNOSIS — G4762 Sleep related leg cramps: Secondary | ICD-10-CM | POA: Insufficient documentation

## 2022-05-20 NOTE — Assessment & Plan Note (Signed)
Etiology unclear, given risk factors and symptoms, will have LE arterial study,  to f/u any worsening symptoms or concerns

## 2022-05-20 NOTE — Assessment & Plan Note (Signed)
No overt bleeding recent, for f/u iron level today

## 2022-05-20 NOTE — Assessment & Plan Note (Signed)
Pt encouraged to increase miralax to daily use, and colace 100 bid prn as well

## 2022-05-20 NOTE — Assessment & Plan Note (Signed)
BP Readings from Last 3 Encounters:  05/17/22 122/68  11/16/21 120/82  05/03/21 118/70   Stable, pt to continue medical treatment norvasc 5 qd, avapro 758 qd, and bystolic 5 mg qd

## 2022-05-20 NOTE — Assessment & Plan Note (Signed)
Also for magnesium with labs, and qhs muscle relaxer prn,  to f/u any worsening symptoms or concerns

## 2022-05-20 NOTE — Assessment & Plan Note (Signed)
Age and sex appropriate education and counseling updated with regular exercise and diet Referrals for preventative services - plans to have shingrix at local pharmacy, daughter states they have recall letter and will call for f/u colonoscopy scheduling soon, due for mammogram Immunizations addressed - none needed Smoking counseling  - none needed Evidence for depression or other mood disorder - none significant Most recent labs reviewed. I have personally reviewed and have noted: 1) the patient's medical and social history 2) The patient's current medications and supplements 3) The patient's height, weight, and BMI have been recorded in the chart

## 2022-05-20 NOTE — Assessment & Plan Note (Signed)
Lab Results  Component Value Date   LDLCALC 53 05/18/2022   Stable, pt to continue current statin pravachol 40 mg qd

## 2022-05-26 ENCOUNTER — Other Ambulatory Visit: Payer: Self-pay | Admitting: Internal Medicine

## 2022-05-26 DIAGNOSIS — Z09 Encounter for follow-up examination after completed treatment for conditions other than malignant neoplasm: Secondary | ICD-10-CM

## 2022-05-29 ENCOUNTER — Ambulatory Visit (INDEPENDENT_AMBULATORY_CARE_PROVIDER_SITE_OTHER): Payer: Medicare Other

## 2022-05-29 VITALS — Ht 72.0 in | Wt 189.0 lb

## 2022-05-29 DIAGNOSIS — Z0000 Encounter for general adult medical examination without abnormal findings: Secondary | ICD-10-CM

## 2022-05-29 NOTE — Patient Instructions (Signed)
[name] , Thank you for taking time to come for your Medicare Wellness Visit. I appreciate your ongoing commitment to your health goals. Please review the following plan we discussed and let me know if I can assist you in the future.   Screening recommendations/referrals: Colonoscopy: completed 06/06/2016, due now Mammogram: scheduled for 06/06/2022 Bone Density: completed 05/28/2017 Recommended yearly ophthalmology/optometry visit for glaucoma screening and checkup Recommended yearly dental visit for hygiene and checkup  Vaccinations: Influenza vaccine: due 07/11/2022 Pneumococcal vaccine: completed 06/05/2018 Tdap vaccine: completed 09/12/2019, due 09/11/2029 Shingles vaccine: discussed    Covid-19:decline  Advanced directives: Advance directive discussed with you today.   Conditions/risks identified: none  Next appointment: Follow up in one year for your annual wellness visit    Preventive Care 65 Years and Older, Female Preventive care refers to lifestyle choices and visits with your health care provider that can promote health and wellness. What does preventive care include? A yearly physical exam. This is also called an annual well check. Dental exams once or twice a year. Routine eye exams. Ask your health care provider how often you should have your eyes checked. Personal lifestyle choices, including: Daily care of your teeth and gums. Regular physical activity. Eating a healthy diet. Avoiding tobacco and drug use. Limiting alcohol use. Practicing safe sex. Taking low-dose aspirin every day. Taking vitamin and mineral supplements as recommended by your health care provider. What happens during an annual well check? The services and screenings done by your health care provider during your annual well check will depend on your age, overall health, lifestyle risk factors, and family history of disease. Counseling  Your health care provider may ask you questions about  your: Alcohol use. Tobacco use. Drug use. Emotional well-being. Home and relationship well-being. Sexual activity. Eating habits. History of falls. Memory and ability to understand (cognition). Work and work Statistician. Reproductive health. Screening  You may have the following tests or measurements: Height, weight, and BMI. Blood pressure. Lipid and cholesterol levels. These may be checked every 5 years, or more frequently if you are over 67 years old. Skin check. Lung cancer screening. You may have this screening every year starting at age 78 if you have a 30-pack-year history of smoking and currently smoke or have quit within the past 15 years. Fecal occult blood test (FOBT) of the stool. You may have this test every year starting at age 66. Flexible sigmoidoscopy or colonoscopy. You may have a sigmoidoscopy every 5 years or a colonoscopy every 10 years starting at age 12. Hepatitis C blood test. Hepatitis B blood test. Sexually transmitted disease (STD) testing. Diabetes screening. This is done by checking your blood sugar (glucose) after you have not eaten for a while (fasting). You may have this done every 1-3 years. Bone density scan. This is done to screen for osteoporosis. You may have this done starting at age 55. Mammogram. This may be done every 1-2 years. Talk to your health care provider about how often you should have regular mammograms. Talk with your health care provider about your test results, treatment options, and if necessary, the need for more tests. Vaccines  Your health care provider may recommend certain vaccines, such as: Influenza vaccine. This is recommended every year. Tetanus, diphtheria, and acellular pertussis (Tdap, Td) vaccine. You may need a Td booster every 10 years. Zoster vaccine. You may need this after age 48. Pneumococcal 13-valent conjugate (PCV13) vaccine. One dose is recommended after age 67. Pneumococcal polysaccharide (PPSV23) vaccine.  One dose is recommended after age 55. Talk to your health care provider about which screenings and vaccines you need and how often you need them. This information is not intended to replace advice given to you by your health care provider. Make sure you discuss any questions you have with your health care provider. Document Released: 12/24/2015 Document Revised: 08/16/2016 Document Reviewed: 09/28/2015 Elsevier Interactive Patient Education  2017 [employer] Prevention in the Home Falls can cause injuries. They can happen to people of all ages. There are many things you can do to make your home safe and to help prevent falls. What can I do on the outside of my home? Regularly fix the edges of walkways and driveways and fix any cracks. Remove anything that might make you trip as you walk through a door, such as a raised step or threshold. Trim any bushes or trees on the path to your home. Use bright outdoor lighting. Clear any walking paths of anything that might make someone trip, such as rocks or tools. Regularly check to see if handrails are loose or broken. Make sure that both sides of any steps have handrails. Any raised decks and porches should have guardrails on the edges. Have any leaves, snow, or ice cleared regularly. Use sand or salt on walking paths during winter. Clean up any spills in your garage right away. This includes oil or grease spills. What can I do in the bathroom? Use night lights. Install grab bars by the toilet and in the tub and shower. Do not use towel bars as grab bars. Use non-skid mats or decals in the tub or shower. If you need to sit down in the shower, use a plastic, non-slip stool. Keep the floor dry. Clean up any water that spills on the floor as soon as it happens. Remove soap buildup in the tub or shower regularly. Attach bath mats securely with double-sided non-slip rug tape. Do not have throw rugs and other things on the floor that can make  you trip. What can I do in the bedroom? Use night lights. Make sure that you have a light by your bed that is easy to reach. Do not use any sheets or blankets that are too big for your bed. They should not hang down onto the floor. Have a firm chair that has side arms. You can use this for support while you get dressed. Do not have throw rugs and other things on the floor that can make you trip. What can I do in the kitchen? Clean up any spills right away. Avoid walking on wet floors. Keep items that you use a lot in easy-to-reach places. If you need to reach something above you, use a strong step stool that has a grab bar. Keep electrical cords out of the way. Do not use floor polish or wax that makes floors slippery. If you must use wax, use non-skid floor wax. Do not have throw rugs and other things on the floor that can make you trip. What can I do with my stairs? Do not leave any items on the stairs. Make sure that there are handrails on both sides of the stairs and use them. Fix handrails that are broken or loose. Make sure that handrails are as long as the stairways. Check any carpeting to make sure that it is firmly attached to the stairs. Fix any carpet that is loose or worn. Avoid having throw rugs at the top or bottom of the  stairs. If you do have throw rugs, attach them to the floor with carpet tape. Make sure that you have a light switch at the top of the stairs and the bottom of the stairs. If you do not have them, ask someone to add them for you. What else can I do to help prevent falls? Wear shoes that: Do not have high heels. Have rubber bottoms. Are comfortable and fit you well. Are closed at the toe. Do not wear sandals. If you use a stepladder: Make sure that it is fully opened. Do not climb a closed stepladder. Make sure that both sides of the stepladder are locked into place. Ask someone to hold it for you, if possible. Clearly mark and make sure that you can  see: Any grab bars or handrails. First and last steps. Where the edge of each step is. Use tools that help you move around (mobility aids) if they are needed. These include: Canes. Walkers. Scooters. Crutches. Turn on the lights when you go into a dark area. Replace any light bulbs as soon as they burn out. Set up your furniture so you have a clear path. Avoid moving your furniture around. If any of your floors are uneven, fix them. If there are any pets around you, be aware of where they are. Review your medicines with your doctor. Some medicines can make you feel dizzy. This can increase your chance of falling. Ask your doctor what other things that you can do to help prevent falls. This information is not intended to replace advice given to you by your health care provider. Make sure you discuss any questions you have with your health care provider. Document Released: 09/23/2009 Document Revised: 05/04/2016 Document Reviewed: 01/01/2015 Elsevier Interactive Patient Education  2017 [employer].

## 2022-05-29 NOTE — Progress Notes (Signed)
I connected with [name] today by telephone and verified that I am speaking with the correct person using two identifiers. Location patient: home Location provider: work Persons participating in the virtual visit: Mudlogger, Futures trader (daughter), Glenna Durand LPN.   I discussed the limitations, risks, security and privacy concerns of performing an evaluation and management service by telephone and the availability of in person appointments. I also discussed with the patient that there may be a patient responsible charge related to this service. The patient expressed understanding and verbally consented to this telephonic visit.    Interactive audio and video telecommunications were attempted between this provider and patient, however failed, due to patient having technical difficulties OR patient did not have access to video capability.  We continued and completed visit with audio only.     Vital signs may be patient reported or missing.  Subjective:   Andrea Anthony is a 76 y.o. female who presents for Medicare Annual (Subsequent) preventive examination.  Review of Systems     Cardiac Risk Factors include: advanced age (>85mn, >>78women);diabetes mellitus;dyslipidemia;hypertension     Objective:    Today's Vitals   05/29/22 1501  Weight: 189 lb (85.7 kg)  Height: 6' (1.829 m)   Body mass index is 25.63 kg/m.     05/29/2022    3:08 PM 05/26/2021   11:20 AM 04/28/2020   10:30 AM  Advanced Directives  Does Patient Have a Medical Advance Directive? No No Yes  Type of Advance Directive   Living will;Healthcare Power of Attorney  Does patient want to make changes to medical advance directive?   No - Patient declined  [place]?   No - copy requested  Would patient like information on creating a medical advance directive?  No - Patient declined     Current Medications (verified) Outpatient Encounter Medications as of  05/29/2022  Medication Sig   amLODipine (NORVASC) 5 MG tablet 1 tab by mouth once daily   aspirin 81 MG tablet Take 81 mg by mouth daily.   blood glucose meter kit and supplies KIT Dispense based on patient and insurance preference. Use up to four times daily as directed E11.9   dapagliflozin propanediol (FARXIGA) 10 MG TABS tablet Take 1 tablet (10 mg total) by mouth daily before breakfast.   gabapentin (NEURONTIN) 100 MG capsule Take 1 capsule (100 mg total) by mouth 3 (three) times daily.   HYDROcodone-acetaminophen (NORCO/VICODIN) 5-325 MG tablet Take 1 tablet by mouth every 6 (six) hours as needed.   irbesartan (AVAPRO) 300 MG tablet Take 1 tablet (300 mg total) by mouth daily.   meloxicam (MOBIC) 7.5 MG tablet Take 1 tablet (7.5 mg total) by mouth daily.   metFORMIN (GLUCOPHAGE-XR) 500 MG 24 hr tablet Take 4 tablets (2,000 mg total) by mouth daily with breakfast.   nebivolol (BYSTOLIC) 5 MG tablet Take 1 tablet (5 mg total) by mouth daily.   ONETOUCH DELICA LANCETS 394LMISC Use to help check blood sugars three times a day Dx E11.9   pravastatin (PRAVACHOL) 40 MG tablet Take 1 tablet (40 mg total) by mouth daily.   tiZANidine (ZANAFLEX) 2 MG tablet 1 tab by mouth twice per day as needed for cramps   No facility-administered encounter medications on file as of 05/29/2022.    Allergies (verified) Ace inhibitors and Sulfonamide derivatives   History: Past Medical History:  Diagnosis Date   ALLERGIC RHINITIS 01/07/2008   Qualifier: Diagnosis of  By:  Jenny Reichmann MD, Hunt Oris    [facility] DEFICIENCY 01/07/2008   Qualifier: Diagnosis of  By: Jenny Reichmann MD, Hunt Oris    DIABETES MELLITUS, TYPE II 06/24/2007   Qualifier: Diagnosis of  By: Jenny Reichmann MD, [place], COLON 01/07/2008   Qualifier: Diagnosis of  By: Jenny Reichmann MD, Hunt Oris    Empty sella ([place]) 03/17/2013   GERD 05/24/2009   Qualifier: Diagnosis of  By: Jenny Reichmann MD, Hunt Oris    HYPERLIPIDEMIA 06/01/2008   Qualifier: Diagnosis of  By: Jenny Reichmann MD, Hunt Oris    HYPERTENSION 06/24/2007   Qualifier: Diagnosis of  By: Jenny Reichmann MD, Hunt Oris    Lumbar disc disease 10/08/2012   l4-5 per CT 2008   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, 2001   PULMONARY NODULE, RIGHT LOWER LOBE 01/09/2008   Qualifier: Diagnosis of  By: Jenny Reichmann MD, Hunt Oris    Umbilical hernia 36/46/8032   Fat containing on CT 2008   Past Surgical History:  Procedure Laterality Date   NO PAST SURGERIES     UPPER GASTROINTESTINAL ENDOSCOPY     Family History  Problem Relation Age of Onset   Hypertension Father    Colon cancer Neg Hx    Social History   Socioeconomic History   Marital status: Married    Spouse name: Not on file   Number of children: Not on file   Years of education: Not on file   Highest education level: Not on file  Occupational History   Not on file  Tobacco Use   Smoking status: Never   Smokeless tobacco: Never  Vaping Use   Vaping Use: Never used  Substance and Sexual Activity   Alcohol use: No   Drug use: No   Sexual activity: Not on file  Other Topics Concern   Not on file  Social History Narrative   Not on file   Social Determinants of Health   Financial Resource Strain: Medium Risk (05/29/2022)   Overall Financial Resource Strain (CARDIA)    Difficulty of Paying Living Expenses: Somewhat hard  Food Insecurity: No Food Insecurity (05/29/2022)   Hunger Vital Sign    Worried About Running Out of Food in the Last Year: Never true    Ran Out of Food in the Last Year: Never true  Transportation Needs: No Transportation Needs (05/29/2022)   PRAPARE - Transportation    Lack of Transportation (Medical): No    Lack of Transportation (Non-Medical): No  Physical Activity: Insufficiently Active (05/29/2022)   Exercise Vital Sign    Days of Exercise per Week: 7 days    Minutes of Exercise per Session: 20 min  Stress: No Stress Concern Present (05/29/2022)   [place]    Feeling of  Stress : Not at all  Social Connections: [place] (05/26/2021)   Social Connection and Isolation Panel [NHANES]    Frequency of Communication with Friends and Family: More than three times a week    Frequency of Social Gatherings with Friends and Family: More than three times a week    Attends Religious Services: More than 4 times per year    Active Member of [employer] or Organizations: No    Attends Music therapist: More than 4 times per year    Marital Status: Married    Tobacco Counseling Counseling given: Not Answered   Clinical Intake:  Pre-visit preparation completed: Yes  Pain : No/denies pain  Nutritional Status: BMI 25 -29 Overweight Nutritional Risks: None Diabetes: Yes  How often do you need to have someone help you when you read instructions, pamphlets, or other written materials from your doctor or pharmacy?: 1 - Never  Diabetic? Yes Nutrition Risk Assessment:  Has the patient had any N/V/D within the last 2 months?  No  Does the patient have any non-healing wounds?  No  Has the patient had any unintentional weight loss or weight gain?  No   Diabetes:  Is the patient diabetic?  Yes  If diabetic, was a CBG obtained today?  No  Did the patient bring in their glucometer from home?  No  How often do you monitor your CBG's? daily.   Financial Strains and Diabetes Management:  Are you having any financial strains with the device, your supplies or your medication? No .  Does the patient want to be seen by Chronic Care Management for management of their diabetes?  No  Would the patient like to be referred to a Nutritionist or for Diabetic Management?  No   Diabetic Exams:  Diabetic Eye Exam: Completed 05/23/2021 Diabetic Foot Exam: Completed 11/16/2021   Interpreter Needed?: No  Information entered by :: NAllen LPN   Activities of Daily Living    05/29/2022    3:10 PM  In your present state of health, do you have any difficulty  performing the following activities:  Hearing? 0  Vision? 0  Difficulty concentrating or making decisions? 0  Walking or climbing stairs? 0  Dressing or bathing? 0  Doing errands, shopping? 0  Preparing Food and eating ? N  Using the Toilet? N  In the past six months, have you accidently leaked urine? N  Do you have problems with loss of bowel control? N  Managing your Medications? N  Managing your Finances? N  Housekeeping or managing your Housekeeping? N    Patient Care Team: Biagio Borg, MD as PCP - General  Indicate any recent Medical Services you may have received from other than [facility] providers in the past year (date may be approximate).     Assessment:   This is a routine wellness examination for Andrea Anthony.  Hearing/Vision screen Vision Screening - Comments:: Regular eye exams,   Dietary issues and exercise activities discussed: Current Exercise Habits: Home exercise routine, Type of exercise: walking;stretching, Time (Minutes): 20, Frequency (Times/Week): 7, Weekly Exercise (Minutes/Week): 140   Goals Addressed             This Visit's Progress    Patient Stated       05/29/2022, no goals       Depression Screen    05/29/2022    3:09 PM 05/17/2022    8:37 AM 05/17/2022    8:12 AM 05/26/2021   11:23 AM 05/03/2021   11:15 AM 05/03/2021   10:28 AM 04/22/2020   10:24 AM  PHQ 2/9 Scores  PHQ - 2 Score 0 0 0 0 1 0 0  PHQ- 9 Score   0        Fall Risk    05/29/2022    3:09 PM 05/17/2022    8:37 AM 05/17/2022    8:11 AM 05/26/2021   11:21 AM 05/03/2021   11:15 AM  [place] in the past year? 0 0 0 0 0  Number falls in past yr: 0 0 0 0 0  Injury with Fall? 0 0 0 0 0  Risk for fall due to :  Medication side effect      Follow up Falls evaluation completed;Education provided;Falls prevention discussed   Falls evaluation completed     FALL RISK PREVENTION PERTAINING TO THE HOME:  Any stairs in or around the home? Yes  If so, are there any without  handrails? No  Home free of loose throw rugs in walkways, pet beds, electrical cords, etc? Yes  Adequate lighting in your home to reduce risk of falls? Yes   ASSISTIVE DEVICES UTILIZED TO PREVENT FALLS:  Life alert? No  Use of a cane, walker or w/c? No  Grab bars in the bathroom? Yes  Shower chair or bench in shower? No  Elevated toilet seat or a handicapped toilet? No   TIMED UP AND GO:  Was the test performed? No .      Cognitive Function:        Immunizations Immunization History  Administered Date(s) Administered   Fluad Quad(high Dose 65+) 09/12/2019, 10/26/2020, 11/16/2021   H1N1 11/12/2008   Influenza Split 10/18/2010   Influenza Whole 01/07/2008   Influenza, High Dose Seasonal PF 11/09/2015, 10/03/2016, 10/23/2017, 11/20/2018   Influenza,inj,Quad PF,6+ Mos 09/16/2013   Pneumococcal Conjugate-13 09/16/2013   Pneumococcal Polysaccharide-23 05/24/2009, 06/05/2018   Td 05/24/2009   Tdap 09/12/2019   Zoster, Live 10/03/2016    TDAP status: Up to date  Flu Vaccine status: Up to date  Pneumococcal vaccine status: Up to date  Covid-19 vaccine status: Declined, Education has been provided regarding the importance of this vaccine but patient still declined. Advised may receive this vaccine at local pharmacy or Health Dept.or vaccine clinic. Aware to provide a copy of the vaccination Anthony if obtained from local pharmacy or Health Dept. Verbalized acceptance and understanding.  Qualifies for Shingles Vaccine? Yes   Zostavax completed Yes   Shingrix Completed?: No.    Education has been provided regarding the importance of this vaccine. Patient has been advised to call insurance company to determine out of pocket expense if they have not yet received this vaccine. Advised may also receive vaccine at local pharmacy or Health Dept. Verbalized acceptance and understanding.  Screening Tests Health Maintenance  Topic Date Due   Zoster Vaccines- Shingrix (1 of 2) Never  done   COLONOSCOPY (Pts 45-6yr Insurance coverage will need to be confirmed)  06/06/2021   OPHTHALMOLOGY EXAM  05/23/2022   INFLUENZA VACCINE  07/11/2022   FOOT EXAM  11/16/2022   HEMOGLOBIN A1C  11/17/2022   TETANUS/TDAP  09/11/2029   Pneumonia Vaccine 75 Years old  Completed   DEXA SCAN  Completed   Hepatitis C Screening  Completed   HPV VACCINES  Aged Out   COVID-19 Vaccine  Discontinued    Health Maintenance  Health Maintenance Due  Topic Date Due   Zoster Vaccines- Shingrix (1 of 2) Never done   COLONOSCOPY (Pts 45-467yrInsurance coverage will need to be confirmed)  06/06/2021   OPHTHALMOLOGY EXAM  05/23/2022    Colorectal cancer screening: Type of screening: Colonoscopy. Completed 06/06/2016. Repeat every 5 years  Mammogram status: 06/06/2022  Bone Density status: Completed 05/28/2017.   Lung Cancer Screening: (Low Dose CT Chest recommended if Age 75-80ears, 30 pack-year currently smoking OR have quit w/in 15years.) does not qualify.   Lung Cancer Screening Referral: no  Additional Screening:  Hepatitis C Screening: does qualify; Completed 05/09/2016  Vision Screening: Recommended annual ophthalmology exams for early detection of glaucoma and other disorders of the eye. Is the patient up to date with their annual  eye exam?  Yes  Who is the provider or what is the name of the office in which the patient attends annual eye exams? Can't remember name If pt is not established with a provider, would they like to be referred to a provider to establish care? No .   Dental Screening: Recommended annual dental exams for proper oral hygiene  Community Resource Referral / Chronic Care Management: CRR required this visit?  No   CCM required this visit?  No      Plan:     I have personally reviewed and noted the following in the patient's chart:   Medical and social history Use of alcohol, tobacco or illicit drugs  Current medications and supplements including  opioid prescriptions.  Functional ability and status Nutritional status Physical activity Advanced directives List of other physicians Hospitalizations, surgeries, and ER visits in previous 12 months Vitals Screenings to include cognitive, depression, and falls Referrals and appointments  In addition, I have reviewed and discussed with patient certain preventive protocols, quality metrics, and best practice recommendations. A written personalized care plan for preventive services as well as general preventive health recommendations were provided to patient.     Kellie Simmering, LPN   [id number]/91/6384   Nurse Notes: 6 CIT not administered due to language barrier. Daughter translated throughout visit.  Due to this being a virtual visit, the after visit summary with patients personalized plan was offered to patient via mail or my-chart. Patient would like to access on my-chart

## 2022-06-06 ENCOUNTER — Ambulatory Visit: Payer: Medicare Other

## 2022-06-06 ENCOUNTER — Telehealth: Payer: Self-pay | Admitting: Internal Medicine

## 2022-06-06 ENCOUNTER — Ambulatory Visit
Admission: RE | Admit: 2022-06-06 | Discharge: 2022-06-06 | Disposition: A | Discharge status: Home / Self Care | Payer: Medicare Other | Source: Ambulatory Visit | Attending: Internal Medicine | Admitting: Internal Medicine

## 2022-06-06 DIAGNOSIS — Z09 Encounter for follow-up examination after completed treatment for conditions other than malignant neoplasm: Secondary | ICD-10-CM

## 2022-06-07 ENCOUNTER — Ambulatory Visit (HOSPITAL_COMMUNITY)
Admission: RE | Admit: 2022-06-07 | Discharge: 2022-06-07 | Disposition: A | Discharge status: Home / Self Care | Payer: Medicare Other | Source: Ambulatory Visit | Attending: Cardiology | Admitting: Cardiology

## 2022-06-07 DIAGNOSIS — M79604 Pain in right leg: Secondary | ICD-10-CM | POA: Insufficient documentation

## 2022-09-30 ENCOUNTER — Other Ambulatory Visit: Payer: Self-pay | Admitting: Internal Medicine

## 2022-12-21 DIAGNOSIS — H04123 Dry eye syndrome of bilateral lacrimal glands: Secondary | ICD-10-CM | POA: Diagnosis not present

## 2022-12-21 DIAGNOSIS — E119 Type 2 diabetes mellitus without complications: Secondary | ICD-10-CM | POA: Diagnosis not present

## 2022-12-21 DIAGNOSIS — H2513 Age-related nuclear cataract, bilateral: Secondary | ICD-10-CM | POA: Diagnosis not present

## 2022-12-21 DIAGNOSIS — H5203 Hypermetropia, bilateral: Secondary | ICD-10-CM | POA: Diagnosis not present

## 2022-12-21 DIAGNOSIS — H52203 Unspecified astigmatism, bilateral: Secondary | ICD-10-CM | POA: Diagnosis not present

## 2022-12-21 DIAGNOSIS — Z7984 Long term (current) use of oral hypoglycemic drugs: Secondary | ICD-10-CM | POA: Diagnosis not present

## 2022-12-21 DIAGNOSIS — H524 Presbyopia: Secondary | ICD-10-CM | POA: Diagnosis not present

## 2022-12-21 DIAGNOSIS — H25043 Posterior subcapsular polar age-related cataract, bilateral: Secondary | ICD-10-CM | POA: Diagnosis not present

## 2022-12-21 DIAGNOSIS — H25013 Cortical age-related cataract, bilateral: Secondary | ICD-10-CM | POA: Diagnosis not present

## 2023-01-30 DIAGNOSIS — H52203 Unspecified astigmatism, bilateral: Secondary | ICD-10-CM | POA: Diagnosis not present

## 2023-01-30 DIAGNOSIS — H524 Presbyopia: Secondary | ICD-10-CM | POA: Diagnosis not present

## 2023-01-30 DIAGNOSIS — H2513 Age-related nuclear cataract, bilateral: Secondary | ICD-10-CM | POA: Diagnosis not present

## 2023-01-30 DIAGNOSIS — H5203 Hypermetropia, bilateral: Secondary | ICD-10-CM | POA: Diagnosis not present

## 2023-01-30 DIAGNOSIS — E119 Type 2 diabetes mellitus without complications: Secondary | ICD-10-CM | POA: Diagnosis not present

## 2023-01-30 DIAGNOSIS — H43811 Vitreous degeneration, right eye: Secondary | ICD-10-CM | POA: Diagnosis not present

## 2023-01-30 DIAGNOSIS — H25043 Posterior subcapsular polar age-related cataract, bilateral: Secondary | ICD-10-CM | POA: Diagnosis not present

## 2023-01-30 DIAGNOSIS — Z7984 Long term (current) use of oral hypoglycemic drugs: Secondary | ICD-10-CM | POA: Diagnosis not present

## 2023-01-30 DIAGNOSIS — H25013 Cortical age-related cataract, bilateral: Secondary | ICD-10-CM | POA: Diagnosis not present

## 2023-01-31 DIAGNOSIS — H2511 Age-related nuclear cataract, right eye: Secondary | ICD-10-CM | POA: Insufficient documentation

## 2023-04-24 ENCOUNTER — Ambulatory Visit (INDEPENDENT_AMBULATORY_CARE_PROVIDER_SITE_OTHER): Payer: 59 | Admitting: Internal Medicine

## 2023-04-24 ENCOUNTER — Encounter: Payer: Self-pay | Admitting: Internal Medicine

## 2023-04-24 ENCOUNTER — Other Ambulatory Visit: Payer: 59

## 2023-04-24 VITALS — BP 124/76 | HR 70 | Temp 98.1°F | Ht 72.0 in | Wt 184.0 lb

## 2023-04-24 DIAGNOSIS — E559 Vitamin D deficiency, unspecified: Secondary | ICD-10-CM

## 2023-04-24 DIAGNOSIS — Z7984 Long term (current) use of oral hypoglycemic drugs: Secondary | ICD-10-CM | POA: Diagnosis not present

## 2023-04-24 DIAGNOSIS — D508 Other iron deficiency anemias: Secondary | ICD-10-CM

## 2023-04-24 DIAGNOSIS — E118 Type 2 diabetes mellitus with unspecified complications: Secondary | ICD-10-CM | POA: Diagnosis not present

## 2023-04-24 DIAGNOSIS — E538 Deficiency of other specified B group vitamins: Secondary | ICD-10-CM | POA: Diagnosis not present

## 2023-04-24 DIAGNOSIS — E7800 Pure hypercholesterolemia, unspecified: Secondary | ICD-10-CM | POA: Diagnosis not present

## 2023-04-24 DIAGNOSIS — Z0001 Encounter for general adult medical examination with abnormal findings: Secondary | ICD-10-CM | POA: Diagnosis not present

## 2023-04-24 DIAGNOSIS — I10 Essential (primary) hypertension: Secondary | ICD-10-CM | POA: Diagnosis not present

## 2023-04-24 LAB — URINALYSIS, ROUTINE W REFLEX MICROSCOPIC
Bilirubin Urine: NEGATIVE
Hgb urine dipstick: NEGATIVE
Ketones, ur: NEGATIVE
Leukocytes,Ua: NEGATIVE
Nitrite: NEGATIVE
RBC / HPF: NONE SEEN (ref 0–?)
Specific Gravity, Urine: 1.01 (ref 1.000–1.030)
Total Protein, Urine: NEGATIVE
Urine Glucose: >=1000 — AB
Urobilinogen, UA: 0.2 (ref 0.0–1.0)
pH: 6 (ref 5.0–8.0)

## 2023-04-24 LAB — CBC WITH DIFFERENTIAL/PLATELET
Basophils Absolute: 0.1 10*3/uL (ref 0.0–0.1)
Basophils Relative: 0.7 % (ref 0.0–3.0)
Eosinophils Absolute: 0.2 10*3/uL (ref 0.0–0.7)
Eosinophils Relative: 2.7 % (ref 0.0–5.0)
HCT: 42.6 % (ref 36.0–46.0)
Hemoglobin: 14 g/dL (ref 12.0–15.0)
Lymphocytes Relative: 43.9 % (ref 12.0–46.0)
Lymphs Abs: 3 10*3/uL (ref 0.7–4.0)
MCHC: 33 g/dL (ref 30.0–36.0)
MCV: 86.9 fl (ref 78.0–100.0)
Monocytes Absolute: 0.5 10*3/uL (ref 0.1–1.0)
Monocytes Relative: 6.9 % (ref 3.0–12.0)
Neutro Abs: 3.1 10*3/uL (ref 1.4–7.7)
Neutrophils Relative %: 45.8 % (ref 43.0–77.0)
Platelets: 214 10*3/uL (ref 150.0–400.0)
RBC: 4.9 Mil/uL (ref 3.87–5.11)
RDW: 13.1 % (ref 11.5–15.5)
WBC: 6.8 10*3/uL (ref 4.0–10.5)

## 2023-04-24 LAB — IBC PANEL
Iron: 60 ug/dL (ref 42–145)
Saturation Ratios: 16.4 % — ABNORMAL LOW (ref 20.0–50.0)
TIBC: 365.4 ug/dL (ref 250.0–450.0)
Transferrin: 261 mg/dL (ref 212.0–360.0)

## 2023-04-24 LAB — MICROALBUMIN / CREATININE URINE RATIO
Creatinine,U: 24.9 mg/dL
Microalb Creat Ratio: 2.8 mg/g (ref 0.0–30.0)
Microalb, Ur: <0.7 mg/dL (ref 0.0–1.9)

## 2023-04-24 LAB — HEPATIC FUNCTION PANEL
ALT: 23 U/L (ref 0–35)
AST: 22 U/L (ref 0–37)
Albumin: 4.1 g/dL (ref 3.5–5.2)
Alkaline Phosphatase: 87 U/L (ref 39–117)
Bilirubin, Direct: 0.1 mg/dL (ref 0.0–0.3)
Total Bilirubin: 0.6 mg/dL (ref 0.2–1.2)
Total Protein: 7.3 g/dL (ref 6.0–8.3)

## 2023-04-24 LAB — LIPID PANEL
Cholesterol: 142 mg/dL (ref 0–200)
HDL: 52.5 mg/dL (ref >39.00)
LDL Cholesterol: 74 mg/dL (ref 0–99)
NonHDL: 89.33
Total CHOL/HDL Ratio: 3
Triglycerides: 76 mg/dL (ref 0.0–149.0)
VLDL: 15.2 mg/dL (ref 0.0–40.0)

## 2023-04-24 LAB — BASIC METABOLIC PANEL
BUN: 27 mg/dL — ABNORMAL HIGH (ref 6–23)
CO2: 28 mEq/L (ref 19–32)
Calcium: 10.3 mg/dL (ref 8.4–10.5)
Chloride: 99 mEq/L (ref 96–112)
Creatinine, Ser: 1.13 mg/dL (ref 0.40–1.20)
GFR: 47.46 mL/min — ABNORMAL LOW (ref >60.00)
Glucose, Bld: 149 mg/dL — ABNORMAL HIGH (ref 70–99)
Potassium: 4.4 mEq/L (ref 3.5–5.1)
Sodium: 136 mEq/L (ref 135–145)

## 2023-04-24 LAB — VITAMIN B12: Vitamin B-12: 391 pg/mL (ref 211–911)

## 2023-04-24 LAB — VITAMIN D 25 HYDROXY (VIT D DEFICIENCY, FRACTURES): VITD: 40.96 ng/mL (ref 30.00–100.00)

## 2023-04-24 LAB — TSH: TSH: 1.62 u[IU]/mL (ref 0.35–5.50)

## 2023-04-24 LAB — FERRITIN: Ferritin: 25.5 ng/mL (ref 10.0–291.0)

## 2023-04-24 MED ORDER — ONETOUCH DELICA LANCETS 33G MISC: 11 refills | Status: AC

## 2023-04-24 MED ORDER — PRAVASTATIN SODIUM 40 MG PO TABS: 40.0000 mg | ORAL_TABLET | Freq: Every day | ORAL | 3 refills | Status: DC

## 2023-04-24 MED ORDER — NEBIVOLOL HCL 5 MG PO TABS: 5.0000 mg | ORAL_TABLET | Freq: Every day | ORAL | 3 refills | Status: DC

## 2023-04-24 MED ORDER — AMLODIPINE BESYLATE 5 MG PO TABS: ORAL_TABLET | ORAL | 3 refills | Status: DC

## 2023-04-24 MED ORDER — GABAPENTIN 100 MG PO CAPS: 100.0000 mg | ORAL_CAPSULE | Freq: Three times a day (TID) | ORAL | 1 refills | Status: DC

## 2023-04-24 MED ORDER — IRBESARTAN 300 MG PO TABS: 300.0000 mg | ORAL_TABLET | Freq: Every day | ORAL | 3 refills | Status: DC

## 2023-04-24 MED ORDER — METFORMIN HCL ER 500 MG PO TB24: 2000.0000 mg | ORAL_TABLET | Freq: Every day | ORAL | 3 refills | Status: DC

## 2023-04-24 MED ORDER — DAPAGLIFLOZIN PROPANEDIOL 10 MG PO TABS: 10.0000 mg | ORAL_TABLET | Freq: Every day | ORAL | 3 refills | Status: DC

## 2023-04-24 NOTE — Assessment & Plan Note (Signed)
Lab Results  Component Value Date   HGBA1C 8.9 (H) 05/18/2022   Uncontrolled, pt states much better compliance with meds but sugars still up to 200 recently,  pt to continue current medical treatment farxiga 10 mg qd, metformin ER 500 mg - 4 qd, and check a1c

## 2023-04-24 NOTE — Assessment & Plan Note (Signed)
Lab Results  Component Value Date   LDLCALC 53 05/18/2022   Stable, pt to continue current statin pravachol 40 mg qd  

## 2023-04-24 NOTE — Assessment & Plan Note (Signed)
Age and sex appropriate education and counseling updated with regular exercise and diet Referrals for preventative services - declines colonoscopy follow up exam Immunizations addressed - for shingrix at pharmacy Smoking counseling  - none needed Evidence for depression or other mood disorder - none significant Most recent labs reviewed. I have personally reviewed and have noted: 1) the patient's medical and social history 2) The patient's current medications and supplements 3) The patient's height, weight, and BMI have been recorded in the chart

## 2023-04-24 NOTE — Patient Instructions (Addendum)

## 2023-04-24 NOTE — Progress Notes (Signed)
The test results show that your current treatment is OK, as the tests are stable.  Please continue the same plan.  There is no other need for change of treatment or further evaluation based on these results, at this time.  thanks 

## 2023-04-24 NOTE — Assessment & Plan Note (Signed)
BP Readings from Last 3 Encounters:  04/24/23 124/76  05/17/22 122/68  11/16/21 120/82   Stable, pt to continue medical treatment norvasc 5 mg qd, avapro 300 mg qd, bystolic 5 mg qd

## 2023-04-24 NOTE — Assessment & Plan Note (Signed)
No recent overt bleeding,  bruising - for f/u lab 

## 2023-04-24 NOTE — Progress Notes (Signed)
Patient ID: Andrea Anthony, female   DOB: Sep 08, 1946, 77 y.o.   MRN: [id number]         Chief Complaint:: wellness exam and dm, hld, iron def anemia, htn       HPI:  Andrea Anthony is a 77 y.o. female here for wellness exam; for shingrix at pharmacy, declines colonoscopy, o/w up to date                        Also Pt denies chest pain, increased sob or doe, wheezing, orthopnea, PND, increased LE swelling, palpitations, dizziness or syncope.   Pt denies polydipsia, polyuria, or new focal neuro s/s, though admits have at time been up to 200s    Pt denies fever, wt loss, night sweats, loss of appetite, or other constitutional symptoms  No overt bleeding or bruising   Wt Readings from Last 3 Encounters:  04/24/23 184 lb (83.5 kg)  05/29/22 189 lb (85.7 kg)  05/17/22 186 lb (84.4 kg)   BP Readings from Last 3 Encounters:  04/24/23 124/76  05/17/22 122/68  11/16/21 120/82   Immunization History  Administered Date(s) Administered   Fluad Quad(high Dose 65+) 09/12/2019, 10/26/2020, 11/16/2021   H1N1 11/12/2008   Influenza Split 10/18/2010   Influenza Whole 01/07/2008   Influenza, High Dose Seasonal PF 11/09/2015, 10/03/2016, 10/23/2017, 11/20/2018   Influenza,inj,Quad PF,6+ Mos 09/16/2013   Pneumococcal Conjugate-13 09/16/2013   Pneumococcal Polysaccharide-23 05/24/2009, 06/05/2018   Td 05/24/2009   Tdap 09/12/2019   Zoster, Live 10/03/2016   Health Maintenance Due  Topic Date Due   Zoster Vaccines- Shingrix (1 of 2) Never done   HEMOGLOBIN A1C  11/17/2022   Diabetic kidney evaluation - eGFR measurement  05/19/2023   Diabetic kidney evaluation - Urine ACR  05/19/2023   Medicare Annual Wellness (AWV)  05/30/2023      Past Medical History:  Diagnosis Date   ALLERGIC RHINITIS 01/07/2008   Qualifier: Diagnosis of  By: Jonny Ruiz MD, Len Blalock    [facility] DEFICIENCY 01/07/2008   Qualifier: Diagnosis of  By: Jonny Ruiz MD, Len Blalock    DIABETES MELLITUS, TYPE II 06/24/2007   Qualifier:  Diagnosis of  By: Jonny Ruiz MD, Len Blalock    DIVERTICULOSIS, COLON 01/07/2008   Qualifier: Diagnosis of  By: Jonny Ruiz MD, Len Blalock    Empty sella ([facility]) 03/17/2013   GERD 05/24/2009   Qualifier: Diagnosis of  By: Jonny Ruiz MD, Len Blalock    HYPERLIPIDEMIA 06/01/2008   Qualifier: Diagnosis of  By: Jonny Ruiz MD, Len Blalock    HYPERTENSION 06/24/2007   Qualifier: Diagnosis of  By: Jonny Ruiz MD, Len Blalock    Lumbar disc disease 10/08/2012   l4-5 per CT 2008   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, 2001   PULMONARY NODULE, RIGHT LOWER LOBE 01/09/2008   Qualifier: Diagnosis of  By: Jonny Ruiz MD, Len Blalock    Umbilical hernia 10/08/2012   Fat containing on CT 2008   Past Surgical History:  Procedure Laterality Date   NO PAST SURGERIES     UPPER GASTROINTESTINAL ENDOSCOPY      reports that she has never smoked. She has never used smokeless tobacco. She reports that she does not drink alcohol and does not use drugs. family history includes Hypertension in her father. Allergies  Allergen Reactions   Ace Inhibitors     REACTION: cough   Sulfonamide Derivatives     Not sure of reaction   Current Outpatient Medications on File Prior to Visit  Medication Sig Dispense Refill   aspirin 81 MG tablet Take 81 mg by mouth daily.     blood glucose meter kit and supplies KIT Dispense based on patient and insurance preference. Use up to four times daily as directed E11.9 1 each 0   tiZANidine (ZANAFLEX) 2 MG tablet 1 tab by mouth twice per day as needed for cramps 60 tablet 2   No current facility-administered medications on file prior to visit.        ROS:  All others reviewed and negative.  Objective        PE:  BP 124/76 (BP Location: Right Arm, Patient Position: Sitting, Cuff Size: Normal)   Pulse 70   Temp 98.1 F (36.7 C) (Oral)   Ht 6' (1.829 m)   Wt 184 lb (83.5 kg)   SpO2 98%   BMI 24.95 kg/m                 Constitutional: Pt appears in NAD               HENT: Head: NCAT.                Right Ear: External ear normal.                  Left Ear: External ear normal.                Eyes: . Pupils are equal, round, and reactive to light. Conjunctivae and EOM are normal               Nose: without d/c or deformity               Neck: Neck supple. Gross normal ROM               Cardiovascular: Normal rate and regular rhythm.                 Pulmonary/Chest: Effort normal and breath sounds without rales or wheezing.                Abd:  Soft, NT, ND, + BS, no organomegaly               Neurological: Pt is alert. At baseline orientation, motor grossly intact               Skin: Skin is warm. No rashes, no other new lesions, LE edema - none               Psychiatric: Pt behavior is normal without agitation   Micro: none  Cardiac tracings I have personally interpreted today:  none  Pertinent Radiological findings (summarize): none   Lab Results  Component Value Date   WBC 8.5 05/18/2022   HGB 13.4 05/18/2022   HCT 40.7 05/18/2022   PLT 223.0 05/18/2022   GLUCOSE 160 (H) 05/18/2022   CHOL 132 05/18/2022   TRIG 149.0 05/18/2022   HDL 48.90 05/18/2022   LDLCALC 53 05/18/2022   ALT 20 05/18/2022   AST 18 05/18/2022   NA 137 05/18/2022   K 4.2 05/18/2022   CL 99 05/18/2022   CREATININE 1.09 05/18/2022   BUN 25 (H) 05/18/2022   CO2 26 05/18/2022   TSH 1.97 05/18/2022   HGBA1C 8.9 (H) 05/18/2022   MICROALBUR <0.7 05/18/2022   Assessment/Plan:  Andrea Anthony is a 77 y.o. Black or African American [2] female with  has a past medical history of ALLERGIC RHINITIS (01/07/2008), ANEMIA-IRON  DEFICIENCY (01/07/2008), DIABETES MELLITUS, TYPE II (06/24/2007), DIVERTICULOSIS, COLON (01/07/2008), Empty sella (HCC) (03/17/2013), GERD (05/24/2009), HYPERLIPIDEMIA (06/01/2008), HYPERTENSION (06/24/2007), Lumbar disc disease (10/08/2012), MALT lymphoma ([facility]) (06/08/2012), PULMONARY NODULE, RIGHT LOWER LOBE (01/09/2008), and Umbilical hernia (10/08/2012).  Encounter for well adult exam with abnormal findings Age and sex  appropriate education and counseling updated with regular exercise and diet Referrals for preventative services - declines colonoscopy follow up exam Immunizations addressed - for shingrix at pharmacy Smoking counseling  - none needed Evidence for depression or other mood disorder - none significant Most recent labs reviewed. I have personally reviewed and have noted: 1) the patient's medical and social history 2) The patient's current medications and supplements 3) The patient's height, weight, and BMI have been recorded in the chart   Type II diabetes mellitus with manifestations (HCC) Lab Results  Component Value Date   HGBA1C 8.9 (H) 05/18/2022   Uncontrolled, pt states much better compliance with meds but sugars still up to 200 recently,  pt to continue current medical treatment farxiga 10 mg qd, metformin ER 500 mg - 4 qd, and check a1c   Hyperlipidemia Lab Results  Component Value Date   LDLCALC 53 05/18/2022   Stable, pt to continue current statin pravachol 40 mg qd   Iron deficiency anemia No recent overt bleeding, bruising - for f/u lab  Essential hypertension BP Readings from Last 3 Encounters:  04/24/23 124/76  05/17/22 122/68  11/16/21 120/82   Stable, pt to continue medical treatment norvasc 5 mg qd, avapro 300 mg qd, bystolic 5 mg qd  Followup: Return in about 6 months (around 10/25/2023).  Oliver Barre, MD 04/24/2023 9:32 AM [facility]

## 2023-04-25 ENCOUNTER — Other Ambulatory Visit: Payer: Self-pay | Admitting: Internal Medicine

## 2023-04-25 LAB — HEMOGLOBIN A1C
Hgb A1c MFr Bld: 9.7 % of total Hgb — ABNORMAL HIGH (ref <5.7)
Mean Plasma Glucose: 232 mg/dL
eAG (mmol/L): 12.8 mmol/L

## 2023-04-25 MED ORDER — PIOGLITAZONE HCL 45 MG PO TABS
45.0000 mg | ORAL_TABLET | Freq: Every day | ORAL | 3 refills | Status: DC
Start: 2023-04-25 — End: 2023-10-24

## 2023-04-26 DIAGNOSIS — H2511 Age-related nuclear cataract, right eye: Secondary | ICD-10-CM | POA: Diagnosis not present

## 2023-04-26 DIAGNOSIS — H25811 Combined forms of age-related cataract, right eye: Secondary | ICD-10-CM | POA: Diagnosis not present

## 2023-04-26 DIAGNOSIS — Z961 Presence of intraocular lens: Secondary | ICD-10-CM | POA: Diagnosis not present

## 2023-05-04 DIAGNOSIS — Z961 Presence of intraocular lens: Secondary | ICD-10-CM | POA: Diagnosis not present

## 2023-05-16 ENCOUNTER — Other Ambulatory Visit: Payer: Self-pay

## 2023-05-16 MED ORDER — BLOOD GLUCOSE MONITOR KIT: PACK | 0 refills | Status: DC

## 2023-05-16 MED ORDER — BLOOD GLUCOSE MONITOR KIT: PACK | 0 refills | Status: AC

## 2023-05-28 DIAGNOSIS — H25812 Combined forms of age-related cataract, left eye: Secondary | ICD-10-CM | POA: Diagnosis not present

## 2023-05-30 ENCOUNTER — Ambulatory Visit (INDEPENDENT_AMBULATORY_CARE_PROVIDER_SITE_OTHER): Payer: 59

## 2023-05-30 VITALS — Ht 72.0 in | Wt 184.0 lb

## 2023-05-30 DIAGNOSIS — Z0000 Encounter for general adult medical examination without abnormal findings: Secondary | ICD-10-CM | POA: Diagnosis not present

## 2023-05-30 NOTE — Progress Notes (Signed)
Subjective:   Andrea Anthony is a 77 y.o. female who presents for Medicare Annual (Subsequent) preventive examination.  Visit Complete: Virtual  I connected with  Andrea Anthony on 05/30/23 by a audio enabled telemedicine application and verified that I am speaking with the correct person using two identifiers.  Patient Location: Home  Provider Location: Office/Clinic  I discussed the limitations of evaluation and management by telemedicine. The patient expressed understanding and agreed to proceed.  Review of Systems     Cardiac Risk Factors include: advanced age (>63men, >83 women)     Objective:    Today's Vitals   05/30/23 1140  Weight: 184 lb (83.5 kg)  Height: 6' (1.829 m)  PainSc: 0-No pain   Body mass index is 24.95 kg/m.     05/30/2023   11:43 AM 05/29/2022    3:08 PM 05/26/2021   11:20 AM 04/28/2020   10:30 AM  Advanced Directives  Does Patient Have a Medical Advance Directive? No No No Yes  Type of Advance Directive    Living will;Healthcare Power of Attorney  Does patient want to make changes to medical advance directive?    No - Patient declined  Copy of Healthcare Power of Attorney in Chart?    No - copy requested  Would patient like information on creating a medical advance directive? No - Patient declined  No - Patient declined     Current Medications (verified) Outpatient Encounter Medications as of 05/30/2023  Medication Sig   amLODipine (NORVASC) 5 MG tablet 1 tab by mouth once daily   aspirin 81 MG tablet Take 81 mg by mouth daily.   blood glucose meter kit and supplies KIT Dispense based on patient and insurance preference. Use up to four times daily as directed E11.9   dapagliflozin propanediol (FARXIGA) 10 MG TABS tablet Take 1 tablet (10 mg total) by mouth daily before breakfast.   gabapentin (NEURONTIN) 100 MG capsule Take 1 capsule (100 mg total) by mouth 3 (three) times daily.   irbesartan (AVAPRO) 300 MG tablet Take 1 tablet (300 mg  total) by mouth daily.   metFORMIN (GLUCOPHAGE-XR) 500 MG 24 hr tablet Take 4 tablets (2,000 mg total) by mouth daily with breakfast.   nebivolol (BYSTOLIC) 5 MG tablet Take 1 tablet (5 mg total) by mouth daily.   OneTouch Delica Lancets 33G MISC Use to help check blood sugars twice a day Dx E11.9   pioglitazone (ACTOS) 45 MG tablet Take 1 tablet (45 mg total) by mouth daily.   pravastatin (PRAVACHOL) 40 MG tablet Take 1 tablet (40 mg total) by mouth daily.   tiZANidine (ZANAFLEX) 2 MG tablet 1 tab by mouth twice per day as needed for cramps   No facility-administered encounter medications on file as of 05/30/2023.    Allergies (verified) Ace inhibitors and Sulfonamide derivatives   History: Past Medical History:  Diagnosis Date   ALLERGIC RHINITIS 01/07/2008   Qualifier: Diagnosis of  By: Jonny Ruiz MD, Len Blalock    [facility] DEFICIENCY 01/07/2008   Qualifier: Diagnosis of  By: Jonny Ruiz MD, Len Blalock    DIABETES MELLITUS, TYPE II 06/24/2007   Qualifier: Diagnosis of  By: Jonny Ruiz MD, Len Blalock    DIVERTICULOSIS, COLON 01/07/2008   Qualifier: Diagnosis of  By: Jonny Ruiz MD, Len Blalock    Empty sella (HCC) 03/17/2013   GERD 05/24/2009   Qualifier: Diagnosis of  By: Jonny Ruiz MD, Len Blalock    HYPERLIPIDEMIA 06/01/2008   Qualifier: Diagnosis of  By: Jonny Ruiz MD,  Len Blalock    HYPERTENSION 06/24/2007   Qualifier: Diagnosis of  By: Jonny Ruiz MD, Len Blalock    Lumbar disc disease 10/08/2012   l4-5 per CT 2008   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, 2001   PULMONARY NODULE, RIGHT LOWER LOBE 01/09/2008   Qualifier: Diagnosis of  By: Jonny Ruiz MD, Len Blalock    Umbilical hernia 10/08/2012   Fat containing on CT 2008   Past Surgical History:  Procedure Laterality Date   NO PAST SURGERIES     UPPER GASTROINTESTINAL ENDOSCOPY     Family History  Problem Relation Age of Onset   Hypertension Father    Colon cancer Neg Hx    Social History   Socioeconomic History   Marital status: Married    Spouse name: Not on file   Number of children:  Not on file   Years of education: Not on file   Highest education level: Not on file  Occupational History   Not on file  Tobacco Use   Smoking status: Never   Smokeless tobacco: Never  Vaping Use   Vaping Use: Never used  Substance and Sexual Activity   Alcohol use: No   Drug use: No   Sexual activity: Not on file  Other Topics Concern   Not on file  Social History Narrative   Not on file   Social Determinants of Health   Financial Resource Strain: Medium Risk (05/30/2023)   Overall Financial Resource Strain (CARDIA)    Difficulty of Paying Living Expenses: Somewhat hard  Food Insecurity: No Food Insecurity (05/30/2023)   Hunger Vital Sign    Worried About Running Out of Food in the Last Year: Never true    Ran Out of Food in the Last Year: Never true  Transportation Needs: No Transportation Needs (05/30/2023)   PRAPARE - Administrator, Civil Service (Medical): No    Lack of Transportation (Non-Medical): No  Physical Activity: Insufficiently Active (05/30/2023)   Exercise Vital Sign    Days of Exercise per Week: 7 days    Minutes of Exercise per Session: 20 min  Stress: No Stress Concern Present (05/30/2023)   [employer] of Occupational Health - Occupational Stress Questionnaire    Feeling of Stress : Not at all  Social Connections: Socially Integrated (05/30/2023)   Social Connection and Isolation Panel [NHANES]    Frequency of Communication with Friends and Family: More than three times a week    Frequency of Social Gatherings with Friends and Family: More than three times a week    Attends Religious Services: More than 4 times per year    Active Member of [employer] or Organizations: No    Attends Engineer, structural: More than 4 times per year    Marital Status: Married    Tobacco Counseling Counseling given: Not Answered   Clinical Intake:  Pre-visit preparation completed: Yes  Pain : No/denies pain Pain Score: 0-No pain     BMI -  recorded: 24.95 Nutritional Status: BMI of 19-24  Normal Nutritional Risks: None Diabetes: Yes CBG done?: No Did pt. bring in CBG monitor from home?: No  How often do you need to have someone help you when you read instructions, pamphlets, or other written materials from your doctor or pharmacy?: 1 - Never What is the last grade level you completed in school?: Middle School  Interpreter Needed?: No  Information entered by :: Taliesin Hartlage N. Tillie Viverette, LPN.   Activities of Daily Living  05/30/2023   11:36 AM  In your present state of health, do you have any difficulty performing the following activities:  Hearing? 0  Vision? 0  Difficulty concentrating or making decisions? 0  Walking or climbing stairs? 0  Dressing or bathing? 0  Doing errands, shopping? 0  Preparing Food and eating ? N  Using the Toilet? N  In the past six months, have you accidently leaked urine? N  Do you have problems with loss of bowel control? N  Managing your Medications? N  Managing your Finances? N  Housekeeping or managing your Housekeeping? N    Patient Care Team: Corwin Levins, MD as PCP - General  Indicate any recent Medical Services you may have received from other than [facility] providers in the past year (date may be approximate).     Assessment:   This is a routine wellness examination for Andrea Anthony.  Hearing/Vision screen Hearing Screening - Comments:: Denies hearing difficulties   Vision Screening - Comments:: Wears rx glasses - up to date with routine eye exams with Sinclair Ship, MD.   Dietary issues and exercise activities discussed:     Goals Addressed             This Visit's Progress    Client understands the importance of follow-up with providers by attending scheduled visits        Depression Screen    05/30/2023   11:36 AM 04/24/2023    8:23 AM 05/29/2022    3:09 PM 05/17/2022    8:37 AM 05/17/2022    8:12 AM 05/26/2021   11:23 AM 05/03/2021   11:15 AM  PHQ  2/9 Scores  PHQ - 2 Score 0 0 0 0 0 0 1  PHQ- 9 Score 0    0      Fall Risk    05/30/2023   11:36 AM 04/24/2023    8:23 AM 05/29/2022    3:09 PM 05/17/2022    8:37 AM 05/17/2022    8:11 AM  Fall Risk   Falls in the past year? 0 0 0 0 0  Number falls in past yr: 0 0 0 0 0  Injury with Fall? 0 0 0 0 0  Risk for fall due to : No Fall Risks No Fall Risks Medication side effect    Follow up Falls prevention discussed Falls evaluation completed Falls evaluation completed;Education provided;Falls prevention discussed      MEDICARE RISK AT HOME:  Medicare Risk at Home - 05/30/23 1137     Any stairs in or around the home? Yes    If so, are there any without handrails? Yes    Home free of loose throw rugs in walkways, pet beds, electrical cords, etc? Yes    Adequate lighting in your home to reduce risk of falls? Yes    Life alert? No    Use of a cane, walker or w/c? No    Grab bars in the bathroom? Yes    Shower chair or bench in shower? No    Elevated toilet seat or a handicapped toilet? No             TIMED UP AND GO:  Was the test performed?  No  Telephonic Visit  Cognitive Function: Normal cognitive status assessed by direct observation via telephone conversation by this Nurse Health Advisor. No abnormalities found.          05/30/2023   11:36 AM  6CIT Screen  What Year? 0  points  What month? 0 points  What time? 0 points  Count back from 20 0 points  Months in reverse 0 points  Repeat phrase 0 points  Total Score 0 points    Immunizations Immunization History  Administered Date(s) Administered   Fluad Quad(high Dose 65+) 09/12/2019, 10/26/2020, 11/16/2021   H1N1 11/12/2008   Influenza Split 10/18/2010   Influenza Whole 01/07/2008   Influenza, High Dose Seasonal PF 11/09/2015, 10/03/2016, 10/23/2017, 11/20/2018   Influenza,inj,Quad PF,6+ Mos 09/16/2013   Pneumococcal Conjugate-13 09/16/2013   Pneumococcal Polysaccharide-23 05/24/2009, 06/05/2018   Td  05/24/2009   Tdap 09/12/2019   Zoster, Live 10/03/2016    TDAP status: Up to date  Flu Vaccine status: Due, Education has been provided regarding the importance of this vaccine. Advised may receive this vaccine at local pharmacy or Health Dept. Aware to provide a copy of the vaccination record if obtained from local pharmacy or Health Dept. Verbalized acceptance and understanding.  Pneumococcal vaccine status: Up to date  Covid-19 vaccine status: Declined, Education has been provided regarding the importance of this vaccine but patient still declined. Advised may receive this vaccine at local pharmacy or Health Dept.or vaccine clinic. Aware to provide a copy of the vaccination record if obtained from local pharmacy or Health Dept. Verbalized acceptance and understanding.  Qualifies for Shingles Vaccine? Yes   Zostavax completed Yes   Shingrix Completed?: No.    Education has been provided regarding the importance of this vaccine. Patient has been advised to call insurance company to determine out of pocket expense if they have not yet received this vaccine. Advised may also receive vaccine at local pharmacy or Health Dept. Verbalized acceptance and understanding.  Screening Tests Health Maintenance  Topic Date Due   Zoster Vaccines- Shingrix (1 of 2) Never done   Colonoscopy  04/23/2024 (Originally 06/06/2021)   INFLUENZA VACCINE  07/12/2023   HEMOGLOBIN A1C  10/25/2023   OPHTHALMOLOGY EXAM  01/23/2024   Diabetic kidney evaluation - eGFR measurement  04/23/2024   Diabetic kidney evaluation - Urine ACR  04/23/2024   FOOT EXAM  04/23/2024   Medicare Annual Wellness (AWV)  05/29/2024   DTaP/Tdap/Td (3 - Td or Tdap) 09/11/2029   Pneumonia Vaccine 12+ Years old  Completed   DEXA SCAN  Completed   Hepatitis C Screening  Completed   HPV VACCINES  Aged Out   COVID-19 Vaccine  Discontinued    Health Maintenance  Health Maintenance Due  Topic Date Due   Zoster Vaccines- Shingrix (1 of  2) Never done    Colorectal cancer screening: Type of screening: Colonoscopy. Completed 06/06/2016. Repeat every 5 years-postponed by pcp  Mammogram status: Completed 11/28/2019. Repeat every year  Bone Density status: Completed 05/28/2017. Results reflect: Bone density results: NORMAL. Repeat every 5 years.  Lung Cancer Screening: (Low Dose CT Chest recommended if Age 65-80 years, 20 pack-year currently smoking OR have quit w/in 15years.) does not qualify.   Lung Cancer Screening Referral: no  Additional Screening:  Hepatitis C Screening: does qualify; Completed 05/09/2016  Vision Screening: Recommended annual ophthalmology exams for early detection of glaucoma and other disorders of the eye. Is the patient up to date with their annual eye exam?  Yes  Who is the provider or what is the name of the office in which the patient attends annual eye exams? Georganna Skeans Tsamis, MD. If pt is not established with a provider, would they like to be referred to a provider to establish care? No .  Dental Screening: Recommended annual dental exams for proper oral hygiene  Nutrition Risk Assessment:  Has the patient had any N/V/D within the last 2 months?  No  Does the patient have any non-healing wounds?  No  Has the patient had any unintentional weight loss or weight gain?  No   Diabetes:  Is the patient diabetic?  Yes  If diabetic, was a CBG obtained today?  No  Did the patient bring in their glucometer from home?  No  How often do you monitor your CBG's? 4 times daily.   Financial Strains and Diabetes Management:  Are you having any financial strains with the device, your supplies or your medication? No .  Does the patient want to be seen by Chronic Care Management for management of their diabetes?  No  Would the patient like to be referred to a Nutritionist or for Diabetic Management?  No   Diabetic Exams:  Diabetic Eye Exam: Completed 01/22/2023 Diabetic Foot Exam: Completed  04/24/2023  Community Resource Referral / Chronic Care Management: CRR required this visit?  No   CCM required this visit?  No     Plan:     I have personally reviewed and noted the following in the patient's chart:   Medical and social history Use of alcohol, tobacco or illicit drugs  Current medications and supplements including opioid prescriptions. Patient is not currently taking opioid prescriptions. Functional ability and status Nutritional status Physical activity Advanced directives List of other physicians Hospitalizations, surgeries, and ER visits in previous 12 months Vitals Screenings to include cognitive, depression, and falls Referrals and appointments  In addition, I have reviewed and discussed with patient certain preventive protocols, quality metrics, and best practice recommendations. A written personalized care plan for preventive services as well as general preventive health recommendations were provided to patient.     Mickeal Needy, LPN   08/24/7828   After Visit Summary: (Mail) Due to this being a telephonic visit, the after visit summary with patients personalized plan was offered to patient via mail   Nurse Notes: Patient's daughter assisted with visit today.

## 2023-05-30 NOTE — Patient Instructions (Signed)
Andrea Anthony , Thank you for taking time to come for your Medicare Wellness Visit. I appreciate your ongoing commitment to your health goals. Please review the following plan we discussed and let me know if I can assist you in the future.   These are the goals we discussed:  Goals      Client understands the importance of follow-up with providers by attending scheduled visits        This is a list of the screening recommended for you and due dates:  Health Maintenance  Topic Date Due   Zoster (Shingles) Vaccine (1 of 2) Never done   Colon Cancer Screening  04/23/2024*   Flu Shot  07/12/2023   Hemoglobin A1C  10/25/2023   Eye exam for diabetics  01/23/2024   Yearly kidney function blood test for diabetes  04/23/2024   Yearly kidney health urinalysis for diabetes  04/23/2024   Complete foot exam   04/23/2024   Medicare Annual Wellness Visit  05/29/2024   DTaP/Tdap/Td vaccine (3 - Td or Tdap) 09/11/2029   Pneumonia Vaccine  Completed   DEXA scan (bone density measurement)  Completed   Hepatitis C Screening  Completed   HPV Vaccine  Aged Out   COVID-19 Vaccine  Discontinued  *Topic was postponed. The date shown is not the original due date.    Advanced directives: No  Conditions/risks identified: Yes  Next appointment: Follow up in one year for your annual wellness visit by calling [phone].   Preventive Care 5 Years and Older, Female Preventive care refers to lifestyle choices and visits with your health care provider that can promote health and wellness. What does preventive care include? A yearly physical exam. This is also called an annual well check. Dental exams once or twice a year. Routine eye exams. Ask your health care provider how often you should have your eyes checked. Personal lifestyle choices, including: Daily care of your teeth and gums. Regular physical activity. Eating a healthy diet. Avoiding tobacco and drug use. Limiting alcohol use. Practicing  safe sex. Taking low-dose aspirin every day. Taking vitamin and mineral supplements as recommended by your health care provider. What happens during an annual well check? The services and screenings done by your health care provider during your annual well check will depend on your age, overall health, lifestyle risk factors, and family history of disease. Counseling  Your health care provider may ask you questions about your: Alcohol use. Tobacco use. Drug use. Emotional well-being. Home and relationship well-being. Sexual activity. Eating habits. History of falls. Memory and ability to understand (cognition). Work and work Astronomer. Reproductive health. Screening  You may have the following tests or measurements: Height, weight, and BMI. Blood pressure. Lipid and cholesterol levels. These may be checked every 5 years, or more frequently if you are over 74 years old. Skin check. Lung cancer screening. You may have this screening every year starting at age 47 if you have a 30-pack-year history of smoking and currently smoke or have quit within the past 15 years. Fecal occult blood test (FOBT) of the stool. You may have this test every year starting at age 32. Flexible sigmoidoscopy or colonoscopy. You may have a sigmoidoscopy every 5 years or a colonoscopy every 10 years starting at age 56. Hepatitis C blood test. Hepatitis B blood test. Sexually transmitted disease (STD) testing. Diabetes screening. This is done by checking your blood sugar (glucose) after you have not eaten for a while (fasting). You may have this  done every 1-3 years. Bone density scan. This is done to screen for osteoporosis. You may have this done starting at age 61. Mammogram. This may be done every 1-2 years. Talk to your health care provider about how often you should have regular mammograms. Talk with your health care provider about your test results, treatment options, and if necessary, the need for more  tests. Vaccines  Your health care provider may recommend certain vaccines, such as: Influenza vaccine. This is recommended every year. Tetanus, diphtheria, and acellular pertussis (Tdap, Td) vaccine. You may need a Td booster every 10 years. Zoster vaccine. You may need this after age 50. Pneumococcal 13-valent conjugate (PCV13) vaccine. One dose is recommended after age 38. Pneumococcal polysaccharide (PPSV23) vaccine. One dose is recommended after age 91. Talk to your health care provider about which screenings and vaccines you need and how often you need them. This information is not intended to replace advice given to you by your health care provider. Make sure you discuss any questions you have with your health care provider. Document Released: 12/24/2015 Document Revised: 08/16/2016 Document Reviewed: 09/28/2015 Elsevier Interactive Patient Education  2017 [employer] Prevention in the Home Falls can cause injuries. They can happen to people of all ages. There are many things you can do to make your home safe and to help prevent falls. What can I do on the outside of my home? Regularly fix the edges of walkways and driveways and fix any cracks. Remove anything that might make you trip as you walk through a door, such as a raised step or threshold. Trim any bushes or trees on the path to your home. Use bright outdoor lighting. Clear any walking paths of anything that might make someone trip, such as rocks or tools. Regularly check to see if handrails are loose or broken. Make sure that both sides of any steps have handrails. Any raised decks and porches should have guardrails on the edges. Have any leaves, snow, or ice cleared regularly. Use sand or salt on walking paths during winter. Clean up any spills in your garage right away. This includes oil or grease spills. What can I do in the bathroom? Use night lights. Install grab bars by the toilet and in the tub and shower.  Do not use towel bars as grab bars. Use non-skid mats or decals in the tub or shower. If you need to sit down in the shower, use a plastic, non-slip stool. Keep the floor dry. Clean up any water that spills on the floor as soon as it happens. Remove soap buildup in the tub or shower regularly. Attach bath mats securely with double-sided non-slip rug tape. Do not have throw rugs and other things on the floor that can make you trip. What can I do in the bedroom? Use night lights. Make sure that you have a light by your bed that is easy to reach. Do not use any sheets or blankets that are too big for your bed. They should not hang down onto the floor. Have a firm chair that has side arms. You can use this for support while you get dressed. Do not have throw rugs and other things on the floor that can make you trip. What can I do in the kitchen? Clean up any spills right away. Avoid walking on wet floors. Keep items that you use a lot in easy-to-reach places. If you need to reach something above you, use a strong step stool that  has a grab bar. Keep electrical cords out of the way. Do not use floor polish or wax that makes floors slippery. If you must use wax, use non-skid floor wax. Do not have throw rugs and other things on the floor that can make you trip. What can I do with my stairs? Do not leave any items on the stairs. Make sure that there are handrails on both sides of the stairs and use them. Fix handrails that are broken or loose. Make sure that handrails are as long as the stairways. Check any carpeting to make sure that it is firmly attached to the stairs. Fix any carpet that is loose or worn. Avoid having throw rugs at the top or bottom of the stairs. If you do have throw rugs, attach them to the floor with carpet tape. Make sure that you have a light switch at the top of the stairs and the bottom of the stairs. If you do not have them, ask someone to add them for you. What else  can I do to help prevent falls? Wear shoes that: Do not have high heels. Have rubber bottoms. Are comfortable and fit you well. Are closed at the toe. Do not wear sandals. If you use a stepladder: Make sure that it is fully opened. Do not climb a closed stepladder. Make sure that both sides of the stepladder are locked into place. Ask someone to hold it for you, if possible. Clearly mark and make sure that you can see: Any grab bars or handrails. First and last steps. Where the edge of each step is. Use tools that help you move around (mobility aids) if they are needed. These include: Canes. Walkers. Scooters. Crutches. Turn on the lights when you go into a dark area. Replace any light bulbs as soon as they burn out. Set up your furniture so you have a clear path. Avoid moving your furniture around. If any of your floors are uneven, fix them. If there are any pets around you, be aware of where they are. Review your medicines with your doctor. Some medicines can make you feel dizzy. This can increase your chance of falling. Ask your doctor what other things that you can do to help prevent falls. This information is not intended to replace advice given to you by your health care provider. Make sure you discuss any questions you have with your health care provider. Document Released: 09/23/2009 Document Revised: 05/04/2016 Document Reviewed: 01/01/2015 Elsevier Interactive Patient Education  2017 [employer].

## 2023-06-12 DIAGNOSIS — H2512 Age-related nuclear cataract, left eye: Secondary | ICD-10-CM | POA: Diagnosis not present

## 2023-06-12 DIAGNOSIS — H25812 Combined forms of age-related cataract, left eye: Secondary | ICD-10-CM | POA: Diagnosis not present

## 2023-06-13 DIAGNOSIS — Z961 Presence of intraocular lens: Secondary | ICD-10-CM | POA: Diagnosis not present

## 2023-06-19 DIAGNOSIS — Z961 Presence of intraocular lens: Secondary | ICD-10-CM | POA: Diagnosis not present

## 2023-09-25 ENCOUNTER — Ambulatory Visit: Payer: 59 | Admitting: Internal Medicine

## 2023-09-25 ENCOUNTER — Encounter: Payer: Self-pay | Admitting: Internal Medicine

## 2023-09-25 VITALS — BP 126/80 | HR 67 | Temp 97.7°F | Ht 72.0 in | Wt 194.0 lb

## 2023-09-25 DIAGNOSIS — Z7984 Long term (current) use of oral hypoglycemic drugs: Secondary | ICD-10-CM

## 2023-09-25 DIAGNOSIS — E7800 Pure hypercholesterolemia, unspecified: Secondary | ICD-10-CM | POA: Diagnosis not present

## 2023-09-25 DIAGNOSIS — M79662 Pain in left lower leg: Secondary | ICD-10-CM | POA: Diagnosis not present

## 2023-09-25 DIAGNOSIS — I10 Essential (primary) hypertension: Secondary | ICD-10-CM

## 2023-09-25 DIAGNOSIS — Z23 Encounter for immunization: Secondary | ICD-10-CM

## 2023-09-25 DIAGNOSIS — E118 Type 2 diabetes mellitus with unspecified complications: Secondary | ICD-10-CM | POA: Diagnosis not present

## 2023-09-25 DIAGNOSIS — M7989 Other specified soft tissue disorders: Secondary | ICD-10-CM | POA: Diagnosis not present

## 2023-09-25 MED ORDER — TRAMADOL HCL 50 MG PO TABS: 50.0000 mg | ORAL_TABLET | Freq: Four times a day (QID) | ORAL | 0 refills | Status: DC | PRN

## 2023-09-25 NOTE — Progress Notes (Signed)
Patient ID: Andrea Anthony, female   DOB: [birth year], 77 y.o.   MRN: [id number]        Chief Complaint: follow up left leg pain and swelling, dm, htn, hld       HPI:  Andrea Anthony is a 77 y.o. female here with c/o new onset left leg pain and swelling for 1 month, goes to the gym every day and has high pain tolerance, but couldn't ignore the pain the last 2 days and has not been able to go; pain located mostly to the left leg posteriorly at the left knee with effusion, as well as pain to the hamstring insertion sites and the prox calf, no giveaways or falls.  Swelling seems worsening to the distal leg below the knee at the last 2 days as well.  Pt denies chest pain, increased sob or doe, wheezing, orthopnea, PND, increased LE swelling, palpitations, dizziness or syncope.   Pt denies polydipsia, polyuria, or new focal neuro s/s.    Pt denies fever, wt loss, night sweats, loss of appetite, or other constitutional symptoms         Wt Readings from Last 3 Encounters:  09/25/23 194 lb (88 kg)  05/30/23 184 lb (83.5 kg)  04/24/23 184 lb (83.5 kg)   BP Readings from Last 3 Encounters:  09/25/23 126/80  04/24/23 124/76  05/17/22 122/68         Past Medical History:  Diagnosis Date   ALLERGIC RHINITIS 01/07/2008   Qualifier: Diagnosis of  By: Jonny Ruiz MD, Len Blalock    [facility] DEFICIENCY 01/07/2008   Qualifier: Diagnosis of  By: Jonny Ruiz MD, Len Blalock    DIABETES MELLITUS, TYPE II 06/24/2007   Qualifier: Diagnosis of  By: Jonny Ruiz MD, Len Blalock    DIVERTICULOSIS, COLON 01/07/2008   Qualifier: Diagnosis of  By: Jonny Ruiz MD, Len Blalock    Empty sella (HCC) 03/17/2013   GERD 05/24/2009   Qualifier: Diagnosis of  By: Jonny Ruiz MD, Len Blalock    HYPERLIPIDEMIA 06/01/2008   Qualifier: Diagnosis of  By: Jonny Ruiz MD, Len Blalock    HYPERTENSION 06/24/2007   Qualifier: Diagnosis of  By: Jonny Ruiz MD, Len Blalock    Lumbar disc disease 10/08/2012   l4-5 per CT 2008   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   Gastric, 2001   PULMONARY NODULE, RIGHT LOWER LOBE  01/09/2008   Qualifier: Diagnosis of  By: Jonny Ruiz MD, Len Blalock    Umbilical hernia 10/08/2012   Fat containing on CT 2008   Past Surgical History:  Procedure Laterality Date   NO PAST SURGERIES     UPPER GASTROINTESTINAL ENDOSCOPY      reports that she has never smoked. She has never used smokeless tobacco. She reports that she does not drink alcohol and does not use drugs. family history includes Hypertension in her father. Allergies  Allergen Reactions   Ace Inhibitors     REACTION: cough   Sulfonamide Derivatives     Not sure of reaction   Current Outpatient Medications on File Prior to Visit  Medication Sig Dispense Refill   amLODipine (NORVASC) 5 MG tablet 1 tab by mouth once daily 90 tablet 3   aspirin 81 MG tablet Take 81 mg by mouth daily.     blood glucose meter kit and supplies KIT Dispense based on patient and insurance preference. Use up to four times daily as directed E11.9 1 each 0   dapagliflozin propanediol (FARXIGA) 10 MG TABS tablet Take 1 tablet (10 mg total) by mouth  daily before breakfast. 90 tablet 3   gabapentin (NEURONTIN) 100 MG capsule Take 1 capsule (100 mg total) by mouth 3 (three) times daily. 270 capsule 1   irbesartan (AVAPRO) 300 MG tablet Take 1 tablet (300 mg total) by mouth daily. 90 tablet 3   metFORMIN (GLUCOPHAGE-XR) 500 MG 24 hr tablet Take 4 tablets (2,000 mg total) by mouth daily with breakfast. 360 tablet 3   nebivolol (BYSTOLIC) 5 MG tablet Take 1 tablet (5 mg total) by mouth daily. 90 tablet 3   OneTouch Delica Lancets 33G MISC Use to help check blood sugars twice a day Dx E11.9 200 each 11   pioglitazone (ACTOS) 45 MG tablet Take 1 tablet (45 mg total) by mouth daily. 90 tablet 3   pravastatin (PRAVACHOL) 40 MG tablet Take 1 tablet (40 mg total) by mouth daily. 90 tablet 3   tiZANidine (ZANAFLEX) 2 MG tablet 1 tab by mouth twice per day as needed for cramps 60 tablet 2   No current facility-administered medications on file prior to visit.         ROS:  All others reviewed and negative.  Objective        PE:  BP 126/80 (BP Location: Left Arm, Patient Position: Sitting, Cuff Size: Normal)   Pulse 67   Temp 97.7 F (36.5 C) (Oral)   Ht 6' (1.829 m)   Wt 194 lb (88 kg)   SpO2 98%   BMI 26.31 kg/m                 Constitutional: Pt appears in NAD               HENT: Head: NCAT.                Right Ear: External ear normal.                 Left Ear: External ear normal.                Eyes: . Pupils are equal, round, and reactive to light. Conjunctivae and EOM are normal               Nose: without d/c or deformity               Neck: Neck supple. Gross normal ROM               Cardiovascular: Normal rate and regular rhythm.                 Pulmonary/Chest: Effort normal and breath sounds without rales or wheezing.                Abd:  Soft, NT, ND, + BS, no organomegaly               Neurological: Pt is alert. At baseline orientation, motor grossly intact               Lef tknee with 1-2+ effusion, and tender to post let at the hamstring and calf tendon insertion sites.               Skin: Skin is warm. No rashes, no other new lesions, LE edema - none               Psychiatric: Pt behavior is normal without agitation   Micro: none  Cardiac tracings I have personally interpreted today:  none  Pertinent Radiological findings (summarize): none   Lab Results  Component Value Date   WBC 6.8 04/24/2023   HGB 14.0 04/24/2023   HCT 42.6 04/24/2023   PLT 214.0 04/24/2023   GLUCOSE 149 (H) 04/24/2023   CHOL 142 04/24/2023   TRIG 76.0 04/24/2023   HDL 52.50 04/24/2023   LDLCALC 74 04/24/2023   ALT 23 04/24/2023   AST 22 04/24/2023   NA 136 04/24/2023   K 4.4 04/24/2023   CL 99 04/24/2023   CREATININE 1.13 04/24/2023   BUN 27 (H) 04/24/2023   CO2 28 04/24/2023   TSH 1.62 04/24/2023   HGBA1C 9.7 (H) 04/24/2023   MICROALBUR <0.7 04/24/2023   Assessment/Plan:  Andrea Anthony is a 77 y.o. Black or African  American [2] female with  has a past medical history of ALLERGIC RHINITIS (01/07/2008), ANEMIA-IRON DEFICIENCY (01/07/2008), DIABETES MELLITUS, TYPE II (06/24/2007), DIVERTICULOSIS, COLON (01/07/2008), Empty sella (HCC) (03/17/2013), GERD (05/24/2009), HYPERLIPIDEMIA (06/01/2008), HYPERTENSION (06/24/2007), Lumbar disc disease (10/08/2012), MALT lymphoma (06/08/2012), PULMONARY NODULE, RIGHT LOWER LOBE (01/09/2008), and Umbilical hernia (10/08/2012).  Essential hypertension BP Readings from Last 3 Encounters:  09/25/23 126/80  04/24/23 124/76  05/17/22 122/68   Stable, pt to continue medical treatment norvasc 5 mg every day, avapro 300 every day, bystolic 5 qd   Hyperlipidemia Lab Results  Component Value Date   LDLCALC 74 04/24/2023   Uncontrolled, goal ldl < 70, pt to continue current statin pravachol 40 every day and lower chol diet, declines other change for now   Pain and swelling of left lower leg Pt with recent worsening pain and swelling most likely knee djd flare and tendonitis due to overuse, can't r/o dvt - for LLE venous doppler, tramadol prn, refer sports medicine  Type II diabetes mellitus with manifestations (HCC) Lab Results  Component Value Date   HGBA1C 9.7 (H) 04/24/2023   Uncontrolled, pt to continue current medical treatment farxiga 10 every day, metformin ER 500 mg - 4 every day and actos 45 mg every day and f/u a1c   Followup: Return in about 29 days (around 10/24/2023).  Oliver Barre, MD 09/27/2023 3:35 AM [facility] [place] [facility]

## 2023-09-25 NOTE — Patient Instructions (Addendum)
Please take all new medication as prescribed - the tramadol as needed for pain  Please continue all other medications as before, and refills have been done if requested.  Please have the pharmacy call with any other refills you may need.  Please keep your appointments with your specialists as you may have planned  You will be contacted regarding the referral for: Venous doppler for left leg at [facility]  You will be contacted regarding the referral for: Sports Medicine  Please make an Appointment to return in Nov 13, or sooner if needed

## 2023-09-27 ENCOUNTER — Encounter: Payer: Self-pay | Admitting: Internal Medicine

## 2023-09-27 NOTE — Assessment & Plan Note (Signed)
Lab Results  Component Value Date   HGBA1C 9.7 (H) 04/24/2023   Uncontrolled, pt to continue current medical treatment farxiga 10 every day, metformin ER 500 mg - 4 every day and actos 45 mg every day and f/u a1c

## 2023-09-27 NOTE — Assessment & Plan Note (Signed)
Pt with recent worsening pain and swelling most likely knee djd flare and tendonitis due to overuse, can't r/o dvt - for LLE venous doppler, tramadol prn, refer sports medicine

## 2023-09-27 NOTE — Assessment & Plan Note (Signed)
BP Readings from Last 3 Encounters:  09/25/23 126/80  04/24/23 124/76  05/17/22 122/68   Stable, pt to continue medical treatment norvasc 5 mg every day, avapro 300 every day, bystolic 5 qd

## 2023-09-27 NOTE — Assessment & Plan Note (Signed)
Lab Results  Component Value Date   LDLCALC 74 04/24/2023   Uncontrolled, goal ldl < 70, pt to continue current statin pravachol 40 every day and lower chol diet, declines other change for now

## 2023-10-02 ENCOUNTER — Encounter: Payer: Self-pay | Admitting: Sports Medicine

## 2023-10-02 ENCOUNTER — Ambulatory Visit (HOSPITAL_BASED_OUTPATIENT_CLINIC_OR_DEPARTMENT_OTHER)
Admission: RE | Admit: 2023-10-02 | Discharge: 2023-10-02 | Disposition: A | Discharge status: Home / Self Care | Payer: 59 | Source: Ambulatory Visit | Attending: Internal Medicine | Admitting: Internal Medicine

## 2023-10-02 ENCOUNTER — Ambulatory Visit (INDEPENDENT_AMBULATORY_CARE_PROVIDER_SITE_OTHER): Payer: 59 | Admitting: Sports Medicine

## 2023-10-02 ENCOUNTER — Ambulatory Visit (HOSPITAL_BASED_OUTPATIENT_CLINIC_OR_DEPARTMENT_OTHER)
Admission: RE | Admit: 2023-10-02 | Discharge: 2023-10-02 | Disposition: A | Discharge status: Home / Self Care | Payer: 59 | Source: Ambulatory Visit | Attending: Sports Medicine | Admitting: Sports Medicine

## 2023-10-02 VITALS — BP 114/80 | Ht 72.0 in | Wt 194.0 lb

## 2023-10-02 DIAGNOSIS — M79662 Pain in left lower leg: Secondary | ICD-10-CM | POA: Insufficient documentation

## 2023-10-02 DIAGNOSIS — M1712 Unilateral primary osteoarthritis, left knee: Secondary | ICD-10-CM | POA: Diagnosis not present

## 2023-10-02 DIAGNOSIS — M7989 Other specified soft tissue disorders: Secondary | ICD-10-CM

## 2023-10-02 DIAGNOSIS — M85862 Other specified disorders of bone density and structure, left lower leg: Secondary | ICD-10-CM | POA: Diagnosis not present

## 2023-10-02 DIAGNOSIS — M25562 Pain in left knee: Secondary | ICD-10-CM | POA: Diagnosis not present

## 2023-10-02 DIAGNOSIS — M79605 Pain in left leg: Secondary | ICD-10-CM | POA: Diagnosis not present

## 2023-10-02 DIAGNOSIS — R609 Edema, unspecified: Secondary | ICD-10-CM | POA: Diagnosis not present

## 2023-10-02 NOTE — Progress Notes (Signed)
   Subjective:    Patient ID: Andrea Anthony, female    DOB: 1946-10-10, 77 y.o.   MRN: [id number]  HPI chief complaint: Left knee and calf pain  Patient is a very pleasant 77 year old female that presents today with 5 weeks of left knee and leg swelling.  No trauma she can recall.  She is knows most of the swelling to be concentrated around the knee.  She gets pain diffusely in the knee including the posterior aspect of the knee.  Pain does radiate into the calf.  She saw her PCP last week who ordered a Doppler to rule out DVT but that has not yet been done.  Overall, her pain is improving after starting some tramadol which was prescribed by her PCP.  Past medical history reviewed Medications reviewed Allergies reviewed   Review of Systems As above    Objective:   Physical Exam  Well-developed, well-nourished.  No acute distress  Left knee: There is about a 5 degree extension lag.  Flexion is to 100 degrees.  1+ effusion.  No tenderness to palpation.  Knee is stable to ligamentous exam.  Negative Homans.  No tenderness to palpation along the calf.  I do not appreciate any swelling of the foot or ankle.  Limited bedside ultrasound shows a joint effusion as well as a small leaking Baker's cyst.  This ultrasound does not rule out DVT      Assessment & Plan:   Left knee pain and swelling likely secondary to Baker's cyst/DJD  I discussed proceeding with a cortisone injection today for her pain and swelling in the left knee but since her symptoms are improving she has decided to wait on that for now.  I would like to get an x-ray of her left knee and she will follow-up with me in 1 week for reevaluation.  In the meantime, she will schedule her Doppler and will purchase a knee compression sleeve which she will wear during the day if the Doppler is negative. We will discuss x-ray findings at next week's visit and can reconsider merits of cortisone injection as well as physical  therapy.  This note was dictated using Dragon naturally speaking software and may contain errors in syntax, spelling, or content which have not been identified prior to signing this note.

## 2023-10-10 ENCOUNTER — Encounter: Payer: Self-pay | Admitting: Sports Medicine

## 2023-10-10 ENCOUNTER — Ambulatory Visit (INDEPENDENT_AMBULATORY_CARE_PROVIDER_SITE_OTHER): Payer: 59 | Admitting: Sports Medicine

## 2023-10-10 VITALS — BP 112/80 | Ht 72.0 in | Wt 194.0 lb

## 2023-10-10 DIAGNOSIS — M1712 Unilateral primary osteoarthritis, left knee: Secondary | ICD-10-CM

## 2023-10-10 DIAGNOSIS — M25362 Other instability, left knee: Secondary | ICD-10-CM | POA: Diagnosis not present

## 2023-10-10 NOTE — Progress Notes (Signed)
   Subjective:    Patient ID: Andrea Anthony, female    DOB: [birth year], 77 y.o.   MRN: [id number]  HPI  Patient presents today for 1 week follow-up on left leg pain.  X-rays of the left knee show only mild degenerative changes.  There may be a small loose body as well.  Today, her pain is more diffuse throughout the leg.  Although she does have some knee pain most of her pain is in the hamstring and calf area.  Doppler was negative for DVT last week.  She continues with a double upright brace which does seem to be helpful.  However, she is continuing to limp.   Review of Systems As above    Objective:   Physical Exam  Left knee: Range of motion 0 to 110 degrees.  Last week's effusion has resolved.  No significant tenderness to palpation.  Moderate quad atrophy.  Neurovascularly intact distally.      Assessment & Plan:   Left knee DJD Left leg weakness  We once again discussed the possibility of a cortisone injection for her left knee but since her pain is more in the calf and hamstring we will defer that.  Instead, I think formal physical therapy would be helpful.  She may wean to home exercise program per the therapist discretion and will follow-up as needed.  This note was dictated using Dragon naturally speaking software and may contain errors in syntax, spelling, or content which have not been identified prior to signing this note.

## 2023-10-24 ENCOUNTER — Ambulatory Visit: Payer: 59 | Admitting: Internal Medicine

## 2023-10-24 ENCOUNTER — Encounter: Payer: Self-pay | Admitting: Internal Medicine

## 2023-10-24 VITALS — BP 124/74 | HR 65 | Temp 98.6°F | Ht 72.0 in | Wt 190.0 lb

## 2023-10-24 DIAGNOSIS — D508 Other iron deficiency anemias: Secondary | ICD-10-CM | POA: Diagnosis not present

## 2023-10-24 DIAGNOSIS — E559 Vitamin D deficiency, unspecified: Secondary | ICD-10-CM | POA: Diagnosis not present

## 2023-10-24 DIAGNOSIS — Z7984 Long term (current) use of oral hypoglycemic drugs: Secondary | ICD-10-CM

## 2023-10-24 DIAGNOSIS — J309 Allergic rhinitis, unspecified: Secondary | ICD-10-CM

## 2023-10-24 DIAGNOSIS — E7800 Pure hypercholesterolemia, unspecified: Secondary | ICD-10-CM

## 2023-10-24 DIAGNOSIS — E118 Type 2 diabetes mellitus with unspecified complications: Secondary | ICD-10-CM

## 2023-10-24 DIAGNOSIS — R9431 Abnormal electrocardiogram [ECG] [EKG]: Secondary | ICD-10-CM

## 2023-10-24 DIAGNOSIS — I10 Essential (primary) hypertension: Secondary | ICD-10-CM | POA: Diagnosis not present

## 2023-10-24 DIAGNOSIS — E538 Deficiency of other specified B group vitamins: Secondary | ICD-10-CM | POA: Diagnosis not present

## 2023-10-24 DIAGNOSIS — H409 Unspecified glaucoma: Secondary | ICD-10-CM | POA: Insufficient documentation

## 2023-10-24 LAB — BASIC METABOLIC PANEL
BUN: 18 mg/dL (ref 6–23)
CO2: 28 meq/L (ref 19–32)
Calcium: 10.7 mg/dL — ABNORMAL HIGH (ref 8.4–10.5)
Chloride: 102 meq/L (ref 96–112)
Creatinine, Ser: 1.04 mg/dL (ref 0.40–1.20)
GFR: 52.24 mL/min — ABNORMAL LOW (ref >60.00)
Glucose, Bld: 168 mg/dL — ABNORMAL HIGH (ref 70–99)
Potassium: 4.2 meq/L (ref 3.5–5.1)
Sodium: 138 meq/L (ref 135–145)

## 2023-10-24 LAB — URINALYSIS, ROUTINE W REFLEX MICROSCOPIC
Bilirubin Urine: NEGATIVE
Hgb urine dipstick: NEGATIVE
Ketones, ur: NEGATIVE
Leukocytes,Ua: NEGATIVE
Nitrite: NEGATIVE
Specific Gravity, Urine: 1.01 (ref 1.000–1.030)
Total Protein, Urine: NEGATIVE
Urine Glucose: >=1000 — AB
Urobilinogen, UA: 0.2 (ref 0.0–1.0)
pH: 6 (ref 5.0–8.0)

## 2023-10-24 LAB — CBC WITH DIFFERENTIAL/PLATELET
Basophils Absolute: 0.1 10*3/uL (ref 0.0–0.1)
Basophils Relative: 0.8 % (ref 0.0–3.0)
Eosinophils Absolute: 0.1 10*3/uL (ref 0.0–0.7)
Eosinophils Relative: 1 % (ref 0.0–5.0)
HCT: 42.9 % (ref 36.0–46.0)
Hemoglobin: 13.9 g/dL (ref 12.0–15.0)
Lymphocytes Relative: 44.4 % (ref 12.0–46.0)
Lymphs Abs: 2.9 10*3/uL (ref 0.7–4.0)
MCHC: 32.5 g/dL (ref 30.0–36.0)
MCV: 90 fL (ref 78.0–100.0)
Monocytes Absolute: 0.5 10*3/uL (ref 0.1–1.0)
Monocytes Relative: 6.9 % (ref 3.0–12.0)
Neutro Abs: 3.1 10*3/uL (ref 1.4–7.7)
Neutrophils Relative %: 46.9 % (ref 43.0–77.0)
Platelets: 223 10*3/uL (ref 150.0–400.0)
RBC: 4.77 Mil/uL (ref 3.87–5.11)
RDW: 13.4 % (ref 11.5–15.5)
WBC: 6.6 10*3/uL (ref 4.0–10.5)

## 2023-10-24 LAB — MICROALBUMIN / CREATININE URINE RATIO
Creatinine,U: 32.7 mg/dL
Microalb Creat Ratio: 2.1 mg/g (ref 0.0–30.0)
Microalb, Ur: <0.7 mg/dL (ref 0.0–1.9)

## 2023-10-24 LAB — LIPID PANEL
Cholesterol: 138 mg/dL (ref 0–200)
HDL: 51.9 mg/dL (ref >39.00)
LDL Cholesterol: 67 mg/dL (ref 0–99)
NonHDL: 85.86
Total CHOL/HDL Ratio: 3
Triglycerides: 94 mg/dL (ref 0.0–149.0)
VLDL: 18.8 mg/dL (ref 0.0–40.0)

## 2023-10-24 LAB — IBC PANEL
Iron: 99 ug/dL (ref 42–145)
Saturation Ratios: 26.7 % (ref 20.0–50.0)
TIBC: 371 ug/dL (ref 250.0–450.0)
Transferrin: 265 mg/dL (ref 212.0–360.0)

## 2023-10-24 LAB — HEPATIC FUNCTION PANEL
ALT: 24 U/L (ref 0–35)
AST: 20 U/L (ref 0–37)
Albumin: 4.2 g/dL (ref 3.5–5.2)
Alkaline Phosphatase: 88 U/L (ref 39–117)
Bilirubin, Direct: 0.1 mg/dL (ref 0.0–0.3)
Total Bilirubin: 0.6 mg/dL (ref 0.2–1.2)
Total Protein: 7.3 g/dL (ref 6.0–8.3)

## 2023-10-24 LAB — VITAMIN D 25 HYDROXY (VIT D DEFICIENCY, FRACTURES): VITD: 45.92 ng/mL (ref 30.00–100.00)

## 2023-10-24 LAB — TSH: TSH: 1.42 u[IU]/mL (ref 0.35–5.50)

## 2023-10-24 LAB — VITAMIN B12: Vitamin B-12: 490 pg/mL (ref 211–911)

## 2023-10-24 LAB — FERRITIN: Ferritin: 22.8 ng/mL (ref 10.0–291.0)

## 2023-10-24 LAB — HEMOGLOBIN A1C: Hgb A1c MFr Bld: 8 % — ABNORMAL HIGH (ref 4.6–6.5)

## 2023-10-24 MED ORDER — TRAMADOL HCL 50 MG PO TABS: 50.0000 mg | ORAL_TABLET | Freq: Three times a day (TID) | ORAL | 5 refills | Status: AC | PRN

## 2023-10-24 MED ORDER — METFORMIN HCL ER 500 MG PO TB24: 2000.0000 mg | ORAL_TABLET | Freq: Every day | ORAL | 3 refills | Status: DC

## 2023-10-24 MED ORDER — GABAPENTIN 100 MG PO CAPS: 100.0000 mg | ORAL_CAPSULE | Freq: Three times a day (TID) | ORAL | 1 refills | Status: DC

## 2023-10-24 MED ORDER — IRBESARTAN 300 MG PO TABS: 300.0000 mg | ORAL_TABLET | Freq: Every day | ORAL | 3 refills | Status: DC

## 2023-10-24 MED ORDER — AMLODIPINE BESYLATE 5 MG PO TABS: ORAL_TABLET | ORAL | 3 refills | Status: DC

## 2023-10-24 MED ORDER — DAPAGLIFLOZIN PROPANEDIOL 10 MG PO TABS: 10.0000 mg | ORAL_TABLET | Freq: Every day | ORAL | 3 refills | Status: DC

## 2023-10-24 MED ORDER — NEBIVOLOL HCL 5 MG PO TABS: 5.0000 mg | ORAL_TABLET | Freq: Every day | ORAL | 3 refills | Status: DC

## 2023-10-24 MED ORDER — PRAVASTATIN SODIUM 40 MG PO TABS: 40.0000 mg | ORAL_TABLET | Freq: Every day | ORAL | 3 refills | Status: DC

## 2023-10-24 MED ORDER — PIOGLITAZONE HCL 45 MG PO TABS
45.0000 mg | ORAL_TABLET | Freq: Every day | ORAL | 3 refills | Status: AC
Start: 2023-10-24 — End: ?

## 2023-10-24 NOTE — Patient Instructions (Addendum)
Please continue all other medications as before, and refills have been done if requested.  Please have the pharmacy call with any other refills you may need.  Please continue your efforts at being more active, low cholesterol diet, and weight control.  You are otherwise up to date with prevention measures today.  Please keep your appointments with your specialists as you may have planned  You will be contacted regarding the referral for: Cardiac CT score  Please go to the LAB at the blood drawing area for the tests to be done  You will be contacted by phone if any changes need to be made immediately.  Otherwise, you will receive a letter about your results with an explanation, but please check with MyChart first.  Please make an Appointment to return in 6 months, or sooner if needed

## 2023-10-24 NOTE — Progress Notes (Signed)
Patient ID: Andrea Anthony, female   DOB: [birth year], 77 y.o.   MRN: [id number]        Chief Complaint: follow up HTN, HLD , DM, iron def anemia, allergies       HPI:  Andrea Anthony is a 77 y.o. female here overall doing ok,  Pt denies chest pain, increased sob or doe, wheezing, orthopnea, PND, increased LE swelling, palpitations, dizziness or syncope.   Pt denies polydipsia, polyuria, or new focal neuro s/s.    Pt denies fever, wt loss, night sweats, loss of appetite, or other constitutional symptoms  Does have several wks ongoing nasal allergy symptoms with clearish congestion, itch and sneezing, without fever, pain, ST, cough, swelling or wheezing  Willing for card CT score.  No overt bleeding         Wt Readings from Last 3 Encounters:  10/24/23 190 lb (86.2 kg)  10/10/23 194 lb (88 kg)  10/02/23 194 lb (88 kg)   BP Readings from Last 3 Encounters:  10/24/23 124/74  10/10/23 112/80  10/02/23 114/80         Past Medical History:  Diagnosis Date   ALLERGIC RHINITIS 01/07/2008   Qualifier: Diagnosis of  By: Jonny Ruiz MD, Len Blalock    [facility] DEFICIENCY 01/07/2008   Qualifier: Diagnosis of  By: Jonny Ruiz MD, Len Blalock    DIABETES MELLITUS, TYPE II 06/24/2007   Qualifier: Diagnosis of  By: Jonny Ruiz MD, Len Blalock    DIVERTICULOSIS, COLON 01/07/2008   Qualifier: Diagnosis of  By: Jonny Ruiz MD, Len Blalock    Empty sella (HCC) 03/17/2013   GERD 05/24/2009   Qualifier: Diagnosis of  By: Jonny Ruiz MD, Len Blalock    HYPERLIPIDEMIA 06/01/2008   Qualifier: Diagnosis of  By: Jonny Ruiz MD, Len Blalock    HYPERTENSION 06/24/2007   Qualifier: Diagnosis of  By: Jonny Ruiz MD, Len Blalock    Lumbar disc disease 10/08/2012   l4-5 per CT 2008   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 She has never used smokeless tobacco. She reports that she does not drink alcohol and does not use drugs. family history includes Hypertension in her father. Allergies  Allergen Reactions   Ace Inhibitors     REACTION: cough   Sulfonamide Derivatives     Not sure of reaction   Current Outpatient Medications on File Prior to Visit  Medication Sig Dispense Refill   aspirin 81 MG tablet Take 81 mg by mouth daily.     blood glucose meter kit and supplies KIT Dispense based on patient and insurance preference. Use up to four times daily as directed E11.9 1 each 0   OneTouch Delica Lancets 33G MISC Use to help check blood sugars twice a day Dx E11.9 200 each 11   tiZANidine (ZANAFLEX) 2 MG tablet 1 tab by mouth twice per day as needed for cramps 60 tablet 2   No current facility-administered medications on file prior to visit.        ROS:  All others reviewed and negative.  Objective        PE:  BP 124/74 (BP Location: Left Arm, Patient  Position: Sitting, Cuff Size: Normal)   Pulse 65   Temp 98.6 F (37 C) (Oral)   Ht 6' (1.829 m)   Wt 190 lb (86.2 kg)   SpO2 99%   BMI 25.77 kg/m                 Constitutional: Pt appears in NAD               HENT: Head: NCAT.                Right Ear: External ear normal.                 Left Ear: External ear normal.                Eyes: . Pupils are equal, round, and reactive to light. Conjunctivae and EOM are normal               Nose: without d/c or deformity               Neck: Neck supple. Gross normal ROM               Cardiovascular: Normal rate and regular rhythm.                 Pulmonary/Chest: Effort normal and breath sounds without rales or wheezing.                Abd:  Soft, NT, ND, + BS, no organomegaly               Neurological: Pt is alert. At baseline orientation, motor grossly intact               Skin: Skin is warm. No rashes, no other new lesions, LE edema - none                Psychiatric: Pt behavior is normal without agitation   Micro: none  Cardiac tracings I have personally interpreted today:  none  Pertinent Radiological findings (summarize): none   Lab Results  Component Value Date   WBC 6.6 10/24/2023   HGB 13.9 10/24/2023   HCT 42.9 10/24/2023   PLT 223.0 10/24/2023   GLUCOSE 168 (H) 10/24/2023   CHOL 138 10/24/2023   TRIG 94.0 10/24/2023   HDL 51.90 10/24/2023   LDLCALC 67 10/24/2023   ALT 24 10/24/2023   AST 20 10/24/2023   NA 138 10/24/2023   K 4.2 10/24/2023   CL 102 10/24/2023   CREATININE 1.04 10/24/2023   BUN 18 10/24/2023   CO2 28 10/24/2023   TSH 1.42 10/24/2023   HGBA1C 8.0 (H) 10/24/2023   MICROALBUR <0.7 10/24/2023   Assessment/Plan:  [name] is a 77 y.o. Black or African American [2] female with  has a past medical history of ALLERGIC RHINITIS (01/07/2008), ANEMIA-IRON DEFICIENCY (01/07/2008), DIABETES MELLITUS, TYPE II (06/24/2007), DIVERTICULOSIS, COLON (01/07/2008), Empty sella (HCC) (03/17/2013), GERD (05/24/2009), HYPERLIPIDEMIA (06/01/2008), HYPERTENSION (06/24/2007), Lumbar disc disease (10/08/2012), MALT lymphoma (06/08/2012), PULMONARY NODULE, RIGHT LOWER LOBE (01/09/2008), and Umbilical hernia (10/08/2012).  Type II diabetes mellitus with manifestations (HCC) Lab Results  Component Value Date   HGBA1C 8.0 (H) 10/24/2023   Uncontrolled hyperglycemia but improved, pt to continue current medical treatment farxiga 10 every day, metfomrin ER 500 - 4 every day, actos 45 every day with good compliance, declines other change for now, also for card CT score   Iron deficiency anemia Lab Results  Component Value Date   WBC  6.6 10/24/2023   HGB 13.9 10/24/2023   HCT 42.9 10/24/2023   MCV 90.0 10/24/2023   PLT 223.0 10/24/2023  Stable overall, cont to monitor  Hyperlipidemia Lab Results  Component Value Date   LDLCALC 67 10/24/2023   Stable, pt to continue current statin pravachol 40 qd   Essential  hypertension BP Readings from Last 3 Encounters:  10/24/23 124/74  10/10/23 112/80  10/02/23 114/80   Stable, pt to continue medical treatment norvasc 5 every day, avapro 300 every day, bystolic 5 qd   Allergic rhinitis Mild to mod, for otc allegra 180 mg prn, to f/u any worsening symptoms or concerns  Followup: Return in about 6 months (around 04/22/2024).  Oliver Barre, MD 10/27/2023 11:55 AM [facility] [place] [facility] - [facility]

## 2023-10-25 ENCOUNTER — Telehealth (HOSPITAL_BASED_OUTPATIENT_CLINIC_OR_DEPARTMENT_OTHER): Payer: Self-pay

## 2023-10-27 ENCOUNTER — Encounter: Payer: Self-pay | Admitting: Internal Medicine

## 2023-10-27 NOTE — Assessment & Plan Note (Signed)
BP Readings from Last 3 Encounters:  10/24/23 124/74  10/10/23 112/80  10/02/23 114/80   Stable, pt to continue medical treatment norvasc 5 every day, avapro 300 every day, bystolic 5 qd

## 2023-10-27 NOTE — Assessment & Plan Note (Signed)
Mild to mod, for otc allegra 180 mg prn, to f/u any worsening symptoms or concerns

## 2023-10-27 NOTE — Assessment & Plan Note (Signed)
Lab Results  Component Value Date   LDLCALC 67 10/24/2023   Stable, pt to continue current statin pravachol 40 qd

## 2023-10-27 NOTE — Assessment & Plan Note (Addendum)
Lab Results  Component Value Date   HGBA1C 8.0 (H) 10/24/2023   Uncontrolled hyperglycemia but improved, pt to continue current medical treatment farxiga 10 every day, metfomrin ER 500 - 4 every day, actos 45 every day with good compliance, declines other change for now, also for card CT score

## 2023-10-27 NOTE — Assessment & Plan Note (Signed)
Lab Results  Component Value Date   WBC 6.6 10/24/2023   HGB 13.9 10/24/2023   HCT 42.9 10/24/2023   MCV 90.0 10/24/2023   PLT 223.0 10/24/2023  Stable overall, cont to monitor

## 2023-10-30 ENCOUNTER — Ambulatory Visit: Payer: 59 | Attending: Sports Medicine

## 2023-10-30 DIAGNOSIS — M25362 Other instability, left knee: Secondary | ICD-10-CM | POA: Insufficient documentation

## 2023-10-30 DIAGNOSIS — M1712 Unilateral primary osteoarthritis, left knee: Secondary | ICD-10-CM | POA: Insufficient documentation

## 2023-10-30 DIAGNOSIS — M6281 Muscle weakness (generalized): Secondary | ICD-10-CM | POA: Diagnosis not present

## 2023-10-30 DIAGNOSIS — G8929 Other chronic pain: Secondary | ICD-10-CM | POA: Diagnosis not present

## 2023-10-30 DIAGNOSIS — M25562 Pain in left knee: Secondary | ICD-10-CM | POA: Diagnosis not present

## 2023-10-30 DIAGNOSIS — M25662 Stiffness of left knee, not elsewhere classified: Secondary | ICD-10-CM | POA: Diagnosis not present

## 2023-10-30 DIAGNOSIS — R262 Difficulty in walking, not elsewhere classified: Secondary | ICD-10-CM | POA: Insufficient documentation

## 2023-10-30 NOTE — Therapy (Unsigned)
OUTPATIENT PHYSICAL THERAPY LOWER EXTREMITY EVALUATION   Patient Name: Andrea Anthony MRN: [id number] DOB:12-05-1946, 77 y.o., female Today's Date: 10/31/2023  END OF SESSION:  PT End of Session - 10/31/23 0850     Visit Number 1    Date for PT Re-Evaluation 12/26/23    Progress Note Due on Visit 10    PT Start Time 1445    PT Stop Time 1538    PT Time Calculation (min) 53 min    Activity Tolerance Patient tolerated treatment well    Behavior During Therapy [facility] for tasks assessed/performed             Past Medical History:  Diagnosis Date   ALLERGIC RHINITIS 01/07/2008   Qualifier: Diagnosis of  By: Jonny Ruiz MD, Len Blalock    [facility] DEFICIENCY 01/07/2008   Qualifier: Diagnosis of  By: Jonny Ruiz MD, Len Blalock    DIABETES MELLITUS, TYPE II 06/24/2007   Qualifier: Diagnosis of  By: Jonny Ruiz MD, Len Blalock    DIVERTICULOSIS, COLON 01/07/2008   Qualifier: Diagnosis of  By: Jonny Ruiz MD, Len Blalock    Empty sella (HCC) 03/17/2013   GERD 05/24/2009   Qualifier: Diagnosis of  By: Jonny Ruiz MD, Len Blalock    HYPERLIPIDEMIA 06/01/2008   Qualifier: Diagnosis of  By: Jonny Ruiz MD, Len Blalock    HYPERTENSION 06/24/2007   Qualifier: Diagnosis of  By: Jonny Ruiz MD, Len Blalock    Lumbar disc disease 10/08/2012   l4-5 per CT 2008   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   Gastric, 2001   PULMONARY NODULE, RIGHT LOWER LOBE 01/09/2008   Qualifier: Diagnosis of  By: Jonny Ruiz MD, Len Blalock    Umbilical hernia 10/08/2012   Fat containing on CT 2008   Past Surgical History:  Procedure Laterality Date   NO PAST SURGERIES     UPPER GASTROINTESTINAL ENDOSCOPY     Patient Active Problem List   Diagnosis Date Noted   Glaucoma 10/24/2023   Nuclear sclerotic cataract of right eye 01/31/2023   Right leg pain 05/20/2022   Nocturnal leg cramps 05/20/2022   Mastoiditis 05/08/2021   Pain and swelling of left lower leg 07/09/2020   Left knee pain 04/24/2020   External otitis of right ear 11/19/2019   CAP (community acquired pneumonia) 11/20/2018   Acute  bronchitis 11/05/2018   Weight loss 06/05/2018   Spinal stenosis of lumbar region with neurogenic claudication 03/21/2017   Left lumbar radiculopathy 01/30/2017   Right cervical radiculopathy 05/14/2014   Neck pain on right side 09/16/2013   Empty sella (HCC) 03/17/2013   Headache 03/10/2013   Blurred vision, bilateral 03/10/2013   Umbilical hernia 10/08/2012   Lumbar disc disease 10/08/2012   Constipation 10/08/2012   Left shoulder pain 08/05/2012   Encounter for well adult exam with abnormal findings 06/08/2012   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   Backache 04/11/2010   PERIPHERAL EDEMA 04/11/2010   EPISTAXIS, RECURRENT 04/11/2010   GERD 05/24/2009   Menopausal and postmenopausal disorder 05/24/2009   FATIGUE 11/12/2008   COMPUTERIZED TOMOGRAPHY, CHEST, ABNORMAL 11/12/2008   Hyperlipidemia 06/01/2008   PULMONARY NODULE, RIGHT LOWER LOBE 01/09/2008   Iron deficiency anemia 01/07/2008   Allergic rhinitis 01/07/2008   Diverticulosis of colon 01/07/2008   CHEST PAIN 01/07/2008   Type II diabetes mellitus with manifestations (HCC) 06/24/2007   Essential hypertension 06/24/2007    PCP: Oliver Barre, MD  REFERRING PROVIDER: Reino Bellis, DO  REFERRING DIAG: L knee osteoarthritis  THERAPY DIAG:  Difficulty in walking, not elsewhere classified - Plan: PT  PLAN OF CARE CERT/RE-CERT  Weakness of left quadriceps muscle - Plan: PT PLAN OF CARE CERT/RE-CERT  Stiffness of left knee, not elsewhere classified - Plan: PT PLAN OF CARE CERT/RE-CERT  Chronic pain of left knee - Plan: PT PLAN OF CARE CERT/RE-CERT  Rationale for Evaluation and Treatment: Rehabilitation  ONSET DATE: chronic  SUBJECTIVE:   SUBJECTIVE STATEMENT: Patient's native language is [place], daughter assisted with providing history and subjective descriptions. Reports chronic L knee pain, [facility], which made her knee hurt for days afterward.  Now knee pain has progressed, hurts in  the back,points to calf and post knee.  Walks frequently  PERTINENT HISTORY: Saw MD with c/o L knee pain, her referred her for outpt PT PAIN:  Are you having pain? Yes: NPRS scale: 2-6/10 Pain location: L lateral, posterior knee, calf Pain description: "just hurts" Aggravating factors: walking Relieving factors: started taking mobic which helped  PRECAUTIONS: None  RED FLAGS: None   WEIGHT BEARING RESTRICTIONS: No  FALLS:  Has patient fallen in last 6 months? No  LIVING ENVIRONMENT: Lives with: lives with their spouse Lives in: House/apartment Stairs: 3 steps outside with rail Has following equipment at home: None  OCCUPATION: retired  PLOF: Independent  PATIENT GOALS: get rid of L knee pain, be able to walk better  NEXT MD VISIT: May 2025 with PCP  OBJECTIVE:  Note: Objective measures were completed at Evaluation unless otherwise noted.  DIAGNOSTIC FINDINGS: CLINICAL DATA:  Left knee pain and edema   EXAM: LEFT KNEE 3 VIEWS   COMPARISON:  None Available.   FINDINGS: No fracture or dislocation. Preserved joint spaces and bone mineralization. Small osteophytes seen of all 3 compartments. Hyperostosis. Question small joint body.   IMPRESSION: Mild degenerative changes.  Question small joint body.  No effusion.     Electronically Signed   By: Karen Kays M.D.   On: 10/02/2023 10:52  PATIENT SURVEYS:  Tegner Lysholm scale 34/100 disability  COGNITION: Overall cognitive status: Within functional limits for tasks assessed     SENSATION: WFL  EDEMA:  Palpable, visible edema L medial knee jt line  MUSCLE LENGTH: NT  POSTURE:  varum B knees, L knee maintains slightly flexed , scooped out, atrophied L distal quadriceps muscle  PALPATION: Decreased L patellar mobility mobility in all directions as compared to R Reports pain with palpation lateral L knee over IT band insertion  LOWER EXTREMITY ROM:  Active ROM Right eval Left eval  Hip flexion     Hip extension    Hip abduction    Hip adduction    Hip internal rotation    Hip external rotation    Knee flexion 116 100 p!  Knee extension  -10  Ankle dorsiflexion  -8  Ankle plantarflexion    Ankle inversion    Ankle eversion     (Blank rows = not tested)  LOWER EXTREMITY MMT: assessed B quads, long arc quad L with 15 degree lag SLR L with 10 degree lag  Did not assess hamstrings, plantarflexors due to time constraints and language barriers   LOWER EXTREMITY SPECIAL TESTS:  NA  FUNCTIONAL TESTS:  30 seconds chair stand test 8  GAIT: Distance walked: in clinic 74' no device Assistive device utilized: None Level of assistance: Complete Independence Comments: wide base of support and increased lateral trunk side bending with walking, some antalgia L,    TODAY'S TREATMENT:  DATE:  10/31/23:  Evaluation, instructed pt and daughter in 2 exercises:    Quad sets with towel roll under knee Long arc quads, using theraband to assist with terminal extension 3. Utilized kinesiotaping to lift and support L patella, 2 I pieces extending from tibial tubercle to upper quads PATIENT EDUCATION:  Education details: POC, goals Person educated: Patient and Child(ren) Education method: Programmer, multimedia, Demonstration, Verbal cues, and Handouts Education comprehension: verbalized understanding, tactile cues required, and needs further education  HOME EXERCISE PROGRAM: See above  ASSESSMENT:  CLINICAL IMPRESSION: Patient is a 77 y.o. female who was evaluated today by skilled physical therapy evaluation and treatment for chronic L knee pain, edema, and quadriceps atrophy. She presents with loss of L ROM for terminal flex and ext as well as medial knee edema, decreased L patellar mobility and marked loss of L quadriceps muscle mass and weakness L quads especially for  terminal extension.  Today's evaluation was lengthy due to language barrier in spite of patient's daughter interpreting, so did not completely assess hamstring strength, plantar flexor strength.    Would recommend skilled physical therapy primarily to address focal quadriceps strengthening, needs further assessment of post knee and ankle structures as well.  Strongly recommend NMES to assist with motor learning, pt had a very difficult time contracting L quads voluntarily. OBJECTIVE IMPAIRMENTS: Abnormal gait, decreased activity tolerance, decreased mobility, difficulty walking, decreased ROM, decreased strength, increased edema, and pain.   ACTIVITY LIMITATIONS: carrying, lifting, bending, squatting, stairs, and locomotion level  PARTICIPATION LIMITATIONS: meal prep, cleaning, laundry, shopping, community activity, and yard work  PERSONAL FACTORS: Age, Behavior pattern, Education, Past/current experiences, and 1-2 comorbidities: DM, HTN, peripheral edema,  are also affecting patient's functional outcome.   REHAB POTENTIAL: Fair    CLINICAL DECISION MAKING: Evolving/moderate complexity  EVALUATION COMPLEXITY: Moderate   GOALS: Goals reviewed with patient? Yes  SHORT TERM GOALS: Target date: 2 weeks 11/14/23 I HEP Baseline: Goal status: INITIAL   LONG TERM GOALS: Target date: 12/26/23  Tegner Lysholm scale improve from 34/100 disability to 24/100 disability regarding L knee Baseline:  Goal status: INITIAL  2.  Able to complete 10 L SLR maintaining no quad  lag for demonstration of better distal quads control and safety with gait Baseline:  Goal status: INITIAL  3.  Long arc quad L improve from 10 degree lag to no lag for demonstration of quads control with walking Baseline:  Goal status: INITIAL  4.  30 sec sit to stand improve from 8 to 12 Baseline:  Goal status: INITIAL   PLAN:  PT FREQUENCY: 2x/week  PT DURATION: 8 weeks  PLANNED INTERVENTIONS: 97110-Therapeutic  exercises, 97530- Therapeutic activity, 97112- Neuromuscular re-education, 97535- Self Care, and [id number]- Manual therapy  PLAN FOR NEXT SESSION: add NMES for muscle re ed L quads, reinforce and instruct further in home ex program, assess hamstrings and L plantarflexor strength   Elinda Bunten L Omkar Stratmann, PT, DPT, OCS 10/31/2023, 3:08 PM

## 2023-10-31 ENCOUNTER — Other Ambulatory Visit: Payer: Self-pay

## 2023-11-14 ENCOUNTER — Ambulatory Visit: Payer: 59 | Attending: Family Medicine

## 2023-11-14 DIAGNOSIS — M25562 Pain in left knee: Secondary | ICD-10-CM | POA: Insufficient documentation

## 2023-11-14 DIAGNOSIS — R262 Difficulty in walking, not elsewhere classified: Secondary | ICD-10-CM | POA: Diagnosis not present

## 2023-11-14 DIAGNOSIS — M25662 Stiffness of left knee, not elsewhere classified: Secondary | ICD-10-CM

## 2023-11-14 DIAGNOSIS — G8929 Other chronic pain: Secondary | ICD-10-CM

## 2023-11-14 DIAGNOSIS — M6281 Muscle weakness (generalized): Secondary | ICD-10-CM | POA: Diagnosis not present

## 2023-11-14 NOTE — Therapy (Signed)
OUTPATIENT PHYSICAL THERAPY TREATMENT   Patient Name: Andrea Anthony MRN: [id number] DOB:03/20/1946, 76 y.o., female Today's Date: 11/14/2023  END OF SESSION:  PT End of Session - 11/14/23 0828     Visit Number 2    Date for PT Re-Evaluation 12/26/23    Progress Note Due on Visit 10    PT Start Time 0820   pt late   PT Stop Time 0904    PT Time Calculation (min) 44 min    Activity Tolerance Patient tolerated treatment well    Behavior During Therapy [facility] for tasks assessed/performed              Past Medical History:  Diagnosis Date   ALLERGIC RHINITIS 01/07/2008   Qualifier: Diagnosis of  By: Jonny Ruiz MD, Len Blalock    [facility] DEFICIENCY 01/07/2008   Qualifier: Diagnosis of  By: Jonny Ruiz MD, Len Blalock    DIABETES MELLITUS, TYPE II 06/24/2007   Qualifier: Diagnosis of  By: Jonny Ruiz MD, Len Blalock    DIVERTICULOSIS, COLON 01/07/2008   Qualifier: Diagnosis of  By: Jonny Ruiz MD, Len Blalock    Empty sella (HCC) 03/17/2013   GERD 05/24/2009   Qualifier: Diagnosis of  By: Jonny Ruiz MD, Len Blalock    HYPERLIPIDEMIA 06/01/2008   Qualifier: Diagnosis of  By: Jonny Ruiz MD, Len Blalock    HYPERTENSION 06/24/2007   Qualifier: Diagnosis of  By: Jonny Ruiz MD, Len Blalock    Lumbar disc disease 10/08/2012   l4-5 per CT 2008   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   Gastric, 2001   PULMONARY NODULE, RIGHT LOWER LOBE 01/09/2008   Qualifier: Diagnosis of  By: Jonny Ruiz MD, Len Blalock    Umbilical hernia 10/08/2012   Fat containing on CT 2008   Past Surgical History:  Procedure Laterality Date   NO PAST SURGERIES     UPPER GASTROINTESTINAL ENDOSCOPY     Patient Active Problem List   Diagnosis Date Noted   Glaucoma 10/24/2023   Nuclear sclerotic cataract of right eye 01/31/2023   Right leg pain 05/20/2022   Nocturnal leg cramps 05/20/2022   Mastoiditis 05/08/2021   Pain and swelling of left lower leg 07/09/2020   Left knee pain 04/24/2020   External otitis of right ear 11/19/2019   CAP (community acquired pneumonia) 11/20/2018   Acute bronchitis  11/05/2018   Weight loss 06/05/2018   Spinal stenosis of lumbar region with neurogenic claudication 03/21/2017   Left lumbar radiculopathy 01/30/2017   Right cervical radiculopathy 05/14/2014   Neck pain on right side 09/16/2013   Empty sella (HCC) 03/17/2013   Headache 03/10/2013   Blurred vision, bilateral 03/10/2013   Umbilical hernia 10/08/2012   Lumbar disc disease 10/08/2012   Constipation 10/08/2012   Left shoulder pain 08/05/2012   Encounter for well adult exam with abnormal findings 06/08/2012   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   Backache 04/11/2010   PERIPHERAL EDEMA 04/11/2010   EPISTAXIS, RECURRENT 04/11/2010   GERD 05/24/2009   Menopausal and postmenopausal disorder 05/24/2009   FATIGUE 11/12/2008   COMPUTERIZED TOMOGRAPHY, CHEST, ABNORMAL 11/12/2008   Hyperlipidemia 06/01/2008   PULMONARY NODULE, RIGHT LOWER LOBE 01/09/2008   Iron deficiency anemia 01/07/2008   Allergic rhinitis 01/07/2008   Diverticulosis of colon 01/07/2008   CHEST PAIN 01/07/2008   Type II diabetes mellitus with manifestations (HCC) 06/24/2007   Essential hypertension 06/24/2007    PCP: Oliver Barre, MD  REFERRING PROVIDER: Reino Bellis, DO  REFERRING DIAG: L knee osteoarthritis  THERAPY DIAG:  Difficulty in walking, not elsewhere classified  Weakness of left quadriceps muscle  Stiffness of left knee, not elsewhere classified  Chronic pain of left knee  Rationale for Evaluation and Treatment: Rehabilitation  ONSET DATE: chronic  SUBJECTIVE:   SUBJECTIVE STATEMENT: Patient's daughter accompanied her for interpretation. Pt reports pain in the posterior thigh and knee. (Unable to give a pain rating)  PERTINENT HISTORY: Saw MD with c/o L knee pain, her referred her for outpt PT PAIN:  Are you having pain? Yes: NPRS scale: 2-6/10 Pain location: L lateral, posterior knee, calf Pain description: "just hurts" Aggravating factors: walking Relieving factors: started taking mobic which  helped  PRECAUTIONS: None  RED FLAGS: None   WEIGHT BEARING RESTRICTIONS: No  FALLS:  Has patient fallen in last 6 months? No  LIVING ENVIRONMENT: Lives with: lives with their spouse Lives in: House/apartment Stairs: 3 steps outside with rail Has following equipment at home: None  OCCUPATION: retired  PLOF: Independent  PATIENT GOALS: get rid of L knee pain, be able to walk better  NEXT MD VISIT: May 2025 with PCP  OBJECTIVE:  Note: Objective measures were completed at Evaluation unless otherwise noted.  DIAGNOSTIC FINDINGS: CLINICAL DATA:  Left knee pain and edema   EXAM: LEFT KNEE 3 VIEWS   COMPARISON:  None Available.   FINDINGS: No fracture or dislocation. Preserved joint spaces and bone mineralization. Small osteophytes seen of all 3 compartments. Hyperostosis. Question small joint body.   IMPRESSION: Mild degenerative changes.  Question small joint body.  No effusion.     Electronically Signed   By: Karen Kays M.D.   On: 10/02/2023 10:52  PATIENT SURVEYS:  Tegner Lysholm scale 34/100 disability  COGNITION: Overall cognitive status: Within functional limits for tasks assessed     SENSATION: WFL  EDEMA:  Palpable, visible edema L medial knee jt line  MUSCLE LENGTH: NT  POSTURE:  varum B knees, L knee maintains slightly flexed , scooped out, atrophied L distal quadriceps muscle  PALPATION: Decreased L patellar mobility mobility in all directions as compared to R Reports pain with palpation lateral L knee over IT band insertion  LOWER EXTREMITY ROM:  Active ROM Right eval Left eval  Hip flexion    Hip extension    Hip abduction    Hip adduction    Hip internal rotation    Hip external rotation    Knee flexion 116 100 p!  Knee extension  -10  Ankle dorsiflexion  -8  Ankle plantarflexion    Ankle inversion    Ankle eversion     (Blank rows = not tested)  LOWER EXTREMITY MMT: assessed B quads, long arc quad L with 15 degree  lag SLR L with 10 degree lag  Did not assess hamstrings, plantarflexors due to time constraints and language barriers 11/14/23- L hamstrings- 4-/5, PF- 4/5 with manual resistance (6 single leg heel raises)  LOWER EXTREMITY SPECIAL TESTS:  NA  FUNCTIONAL TESTS:  30 seconds chair stand test 8  GAIT: Distance walked: in clinic 108' no device Assistive device utilized: None Level of assistance: Complete Independence Comments: wide base of support and increased lateral trunk side bending with walking, some antalgia L,    TODAY'S TREATMENT:  DATE: 11/14/23 Nustep L3x50min  Seated LAQ x 15 Seated L QS x 15 Seated L PF with blue TB Assessment of PF and HS strength NMES to L quads 10 sec on/50 sec off x  --pt performing LAQ with each contraction cycle  10/31/23:  Evaluation, instructed pt and daughter in 2 exercises:    Quad sets with towel roll under knee Long arc quads, using theraband to assist with terminal extension 3. Utilized kinesiotaping to lift and support L patella, 2 I pieces extending from tibial tubercle to upper quads PATIENT EDUCATION:  Education details: POC, goals Person educated: Patient and Child(ren) Education method: Explanation, Demonstration, Verbal cues, and Handouts Education comprehension: verbalized understanding, tactile cues required, and needs further education  HOME EXERCISE PROGRAM: See above  ASSESSMENT:  CLINICAL IMPRESSION: Continued with progression of exercises for quad strength and facilitation. She needs cues to perform the exercises with proper form and technique. We did NMES today and she showed a good response. She was provided instruction on a home unit as well.  OBJECTIVE IMPAIRMENTS: Abnormal gait, decreased activity tolerance, decreased mobility, difficulty walking, decreased ROM, decreased strength,  increased edema, and pain.   ACTIVITY LIMITATIONS: carrying, lifting, bending, squatting, stairs, and locomotion level  PARTICIPATION LIMITATIONS: meal prep, cleaning, laundry, shopping, community activity, and yard work  PERSONAL FACTORS: Age, Behavior pattern, Education, Past/current experiences, and 1-2 comorbidities: DM, HTN, peripheral edema,  are also affecting patient's functional outcome.   REHAB POTENTIAL: Fair    CLINICAL DECISION MAKING: Evolving/moderate complexity  EVALUATION COMPLEXITY: Moderate   GOALS: Goals reviewed with patient? Yes  SHORT TERM GOALS: Target date: 2 weeks 11/14/23 I HEP Baseline: Goal status: INITIAL   LONG TERM GOALS: Target date: 12/26/23  Tegner Lysholm scale improve from 34/100 disability to 24/100 disability regarding L knee Baseline:  Goal status: INITIAL  2.  Able to complete 10 L SLR maintaining no quad  lag for demonstration of better distal quads control and safety with gait Baseline:  Goal status: INITIAL  3.  Long arc quad L improve from 10 degree lag to no lag for demonstration of quads control with walking Baseline:  Goal status: INITIAL  4.  30 sec sit to stand improve from 8 to 12 Baseline:  Goal status: INITIAL   PLAN:  PT FREQUENCY: 2x/week  PT DURATION: 8 weeks  PLANNED INTERVENTIONS: 97110-Therapeutic exercises, 97530- Therapeutic activity, 97112- Neuromuscular re-education, 97535- Self Care, and 16109- Manual therapy  PLAN FOR NEXT SESSION: add NMES for muscle re ed L quads, reinforce and instruct further in home ex program, assess hamstrings and L plantarflexor strength   Hartman Minahan L Zaya Kessenich, PTA 11/14/2023, 9:22 AM

## 2023-11-16 ENCOUNTER — Ambulatory Visit: Payer: 59

## 2023-11-16 DIAGNOSIS — G8929 Other chronic pain: Secondary | ICD-10-CM | POA: Diagnosis not present

## 2023-11-16 DIAGNOSIS — R262 Difficulty in walking, not elsewhere classified: Secondary | ICD-10-CM | POA: Diagnosis not present

## 2023-11-16 DIAGNOSIS — M6281 Muscle weakness (generalized): Secondary | ICD-10-CM

## 2023-11-16 DIAGNOSIS — M25662 Stiffness of left knee, not elsewhere classified: Secondary | ICD-10-CM | POA: Diagnosis not present

## 2023-11-16 DIAGNOSIS — M25562 Pain in left knee: Secondary | ICD-10-CM | POA: Diagnosis not present

## 2023-11-16 NOTE — Therapy (Signed)
OUTPATIENT PHYSICAL THERAPY TREATMENT   Patient Name: Andrea Anthony MRN: [id number] DOB:18-Dec-1945, 77 y.o., female Today's Date: 11/16/2023  END OF SESSION:  PT End of Session - 11/16/23 0811     Visit Number 3    Date for PT Re-Evaluation 12/26/23    Progress Note Due on Visit 10    PT Start Time 0803    PT Stop Time 0844    PT Time Calculation (min) 41 min    Activity Tolerance Patient tolerated treatment well    Behavior During Therapy [facility] for tasks assessed/performed               Past Medical History:  Diagnosis Date   ALLERGIC RHINITIS 01/07/2008   Qualifier: Diagnosis of  By: Jonny Ruiz MD, Len Blalock    [facility] DEFICIENCY 01/07/2008   Qualifier: Diagnosis of  By: Jonny Ruiz MD, Len Blalock    DIABETES MELLITUS, TYPE II 06/24/2007   Qualifier: Diagnosis of  By: Jonny Ruiz MD, Len Blalock    DIVERTICULOSIS, COLON 01/07/2008   Qualifier: Diagnosis of  By: Jonny Ruiz MD, Len Blalock    Empty sella (HCC) 03/17/2013   GERD 05/24/2009   Qualifier: Diagnosis of  By: Jonny Ruiz MD, Len Blalock    HYPERLIPIDEMIA 06/01/2008   Qualifier: Diagnosis of  By: Jonny Ruiz MD, Len Blalock    HYPERTENSION 06/24/2007   Qualifier: Diagnosis of  By: Jonny Ruiz MD, Len Blalock    Lumbar disc disease 10/08/2012   l4-5 per CT 2008   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   Gastric, 2001   PULMONARY NODULE, RIGHT LOWER LOBE 01/09/2008   Qualifier: Diagnosis of  By: Jonny Ruiz MD, Len Blalock    Umbilical hernia 10/08/2012   Fat containing on CT 2008   Past Surgical History:  Procedure Laterality Date   NO PAST SURGERIES     UPPER GASTROINTESTINAL ENDOSCOPY     Patient Active Problem List   Diagnosis Date Noted   Glaucoma 10/24/2023   Nuclear sclerotic cataract of right eye 01/31/2023   Right leg pain 05/20/2022   Nocturnal leg cramps 05/20/2022   Mastoiditis 05/08/2021   Pain and swelling of left lower leg 07/09/2020   Left knee pain 04/24/2020   External otitis of right ear 11/19/2019   CAP (community acquired pneumonia) 11/20/2018   Acute bronchitis  11/05/2018   Weight loss 06/05/2018   Spinal stenosis of lumbar region with neurogenic claudication 03/21/2017   Left lumbar radiculopathy 01/30/2017   Right cervical radiculopathy 05/14/2014   Neck pain on right side 09/16/2013   Empty sella (HCC) 03/17/2013   Headache 03/10/2013   Blurred vision, bilateral 03/10/2013   Umbilical hernia 10/08/2012   Lumbar disc disease 10/08/2012   Constipation 10/08/2012   Left shoulder pain 08/05/2012   Encounter for well adult exam with abnormal findings 06/08/2012   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   Backache 04/11/2010   PERIPHERAL EDEMA 04/11/2010   EPISTAXIS, RECURRENT 04/11/2010   GERD 05/24/2009   Menopausal and postmenopausal disorder 05/24/2009   FATIGUE 11/12/2008   COMPUTERIZED TOMOGRAPHY, CHEST, ABNORMAL 11/12/2008   Hyperlipidemia 06/01/2008   PULMONARY NODULE, RIGHT LOWER LOBE 01/09/2008   Iron deficiency anemia 01/07/2008   Allergic rhinitis 01/07/2008   Diverticulosis of colon 01/07/2008   CHEST PAIN 01/07/2008   Type II diabetes mellitus with manifestations (HCC) 06/24/2007   Essential hypertension 06/24/2007    PCP: Oliver Barre, MD  REFERRING PROVIDER: Reino Bellis, DO  REFERRING DIAG: L knee osteoarthritis  THERAPY DIAG:  Difficulty in walking, not elsewhere classified  Weakness of  left quadriceps muscle  Stiffness of left knee, not elsewhere classified  Chronic pain of left knee  Rationale for Evaluation and Treatment: Rehabilitation  ONSET DATE: chronic  SUBJECTIVE:   SUBJECTIVE STATEMENT: Patient's daughter accompanied her for interpretation. Pt reports pain in the posterior thigh and knee, reports it's the same  PERTINENT HISTORY: Saw MD with c/o L knee pain, her referred her for outpt PT PAIN:  Are you having pain? Yes: NPRS scale: 2-6/10 Pain location: L lateral, posterior knee, calf Pain description: "just hurts" Aggravating factors: walking Relieving factors: started taking mobic which  helped  PRECAUTIONS: None  RED FLAGS: None   WEIGHT BEARING RESTRICTIONS: No  FALLS:  Has patient fallen in last 6 months? No  LIVING ENVIRONMENT: Lives with: lives with their spouse Lives in: House/apartment Stairs: 3 steps outside with rail Has following equipment at home: None  OCCUPATION: retired  PLOF: Independent  PATIENT GOALS: get rid of L knee pain, be able to walk better  NEXT MD VISIT: May 2025 with PCP  OBJECTIVE:  Note: Objective measures were completed at Evaluation unless otherwise noted.  DIAGNOSTIC FINDINGS: CLINICAL DATA:  Left knee pain and edema   EXAM: LEFT KNEE 3 VIEWS   COMPARISON:  None Available.   FINDINGS: No fracture or dislocation. Preserved joint spaces and bone mineralization. Small osteophytes seen of all 3 compartments. Hyperostosis. Question small joint body.   IMPRESSION: Mild degenerative changes.  Question small joint body.  No effusion.     Electronically Signed   By: Karen Kays M.D.   On: 10/02/2023 10:52  PATIENT SURVEYS:  Tegner Lysholm scale 34/100 disability  COGNITION: Overall cognitive status: Within functional limits for tasks assessed     SENSATION: WFL  EDEMA:  Palpable, visible edema L medial knee jt line  MUSCLE LENGTH: NT  POSTURE:  varum B knees, L knee maintains slightly flexed , scooped out, atrophied L distal quadriceps muscle  PALPATION: Decreased L patellar mobility mobility in all directions as compared to R Reports pain with palpation lateral L knee over IT band insertion  LOWER EXTREMITY ROM:  Active ROM Right eval Left eval  Hip flexion    Hip extension    Hip abduction    Hip adduction    Hip internal rotation    Hip external rotation    Knee flexion 116 100 p!  Knee extension  -10  Ankle dorsiflexion  -8  Ankle plantarflexion    Ankle inversion    Ankle eversion     (Blank rows = not tested)  LOWER EXTREMITY MMT: assessed B quads, long arc quad L with 15 degree  lag SLR L with 10 degree lag  Did not assess hamstrings, plantarflexors due to time constraints and language barriers 11/14/23- L hamstrings- 4-/5, PF- 4/5 with manual resistance (6 single leg heel raises)  LOWER EXTREMITY SPECIAL TESTS:  NA  FUNCTIONAL TESTS:  30 seconds chair stand test 8  GAIT: Distance walked: in clinic 76' no device Assistive device utilized: None Level of assistance: Complete Independence Comments: wide base of support and increased lateral trunk side bending with walking, some antalgia L,    TODAY'S TREATMENT:  DATE: 11/16/23 Nustep L5x22min Seated hip ADD ball squeeze 10x3" Seated L HS curls YTB x 10  Seated L LAQ 2# x 10  Seated marching 2# x 10  Standing heel raises x 10  Patellar medial glides grade IV mobs Tibiofemoral joint mobs AP grade I-II for pain Passive hamstring stretching 2 reps   11/14/23 Nustep L3x74min  Seated LAQ x 15 Seated L QS x 15 Seated L PF with blue TB Assessment of PF and HS strength NMES to L quads 10 sec on/50 sec off x  --pt performing LAQ with each contraction cycle  10/31/23:  Evaluation, instructed pt and daughter in 2 exercises:    Quad sets with towel roll under knee Long arc quads, using theraband to assist with terminal extension 3. Utilized kinesiotaping to lift and support L patella, 2 I pieces extending from tibial tubercle to upper quads PATIENT EDUCATION:  Education details: POC, goals Person educated: Patient and Child(ren) Education method: Explanation, Demonstration, Verbal cues, and Handouts Education comprehension: verbalized understanding, tactile cues required, and needs further education  HOME EXERCISE PROGRAM: See above  ASSESSMENT:  CLINICAL IMPRESSION: Pt tolerated the progression of exercises well. Cues needed with HS curls for proper motion. Good response to  the joint mobilizations today with patient reporting decreased pain. She does show decreased medial glide on her L patella, which could be contributing to her symptoms.   OBJECTIVE IMPAIRMENTS: Abnormal gait, decreased activity tolerance, decreased mobility, difficulty walking, decreased ROM, decreased strength, increased edema, and pain.   ACTIVITY LIMITATIONS: carrying, lifting, bending, squatting, stairs, and locomotion level  PARTICIPATION LIMITATIONS: meal prep, cleaning, laundry, shopping, community activity, and yard work  PERSONAL FACTORS: Age, Behavior pattern, Education, Past/current experiences, and 1-2 comorbidities: DM, HTN, peripheral edema,  are also affecting patient's functional outcome.   REHAB POTENTIAL: Fair    CLINICAL DECISION MAKING: Evolving/moderate complexity  EVALUATION COMPLEXITY: Moderate   GOALS: Goals reviewed with patient? Yes  SHORT TERM GOALS: Target date: 2 weeks 11/14/23 I HEP Baseline: Goal status: INITIAL   LONG TERM GOALS: Target date: 12/26/23  Tegner Lysholm scale improve from 34/100 disability to 24/100 disability regarding L knee Baseline:  Goal status: INITIAL  2.  Able to complete 10 L SLR maintaining no quad  lag for demonstration of better distal quads control and safety with gait Baseline:  Goal status: INITIAL  3.  Long arc quad L improve from 10 degree lag to no lag for demonstration of quads control with walking Baseline:  Goal status: INITIAL  4.  30 sec sit to stand improve from 8 to 12 Baseline:  Goal status: INITIAL   PLAN:  PT FREQUENCY: 2x/week  PT DURATION: 8 weeks  PLANNED INTERVENTIONS: 97110-Therapeutic exercises, 97530- Therapeutic activity, 97112- Neuromuscular re-education, 97535- Self Care, and 29528- Manual therapy  PLAN FOR NEXT SESSION: add NMES for muscle re ed L quads, reinforce and instruct further in home ex program, assess hamstrings and L plantarflexor strength   Bessie Livingood L Marcio Hoque,  PTA 11/16/2023, 8:49 AM

## 2023-11-20 ENCOUNTER — Ambulatory Visit: Payer: 59

## 2023-11-20 DIAGNOSIS — M6281 Muscle weakness (generalized): Secondary | ICD-10-CM | POA: Diagnosis not present

## 2023-11-20 DIAGNOSIS — M25562 Pain in left knee: Secondary | ICD-10-CM | POA: Diagnosis not present

## 2023-11-20 DIAGNOSIS — G8929 Other chronic pain: Secondary | ICD-10-CM | POA: Diagnosis not present

## 2023-11-20 DIAGNOSIS — R262 Difficulty in walking, not elsewhere classified: Secondary | ICD-10-CM

## 2023-11-20 DIAGNOSIS — M25662 Stiffness of left knee, not elsewhere classified: Secondary | ICD-10-CM

## 2023-11-20 NOTE — Therapy (Signed)
OUTPATIENT PHYSICAL THERAPY TREATMENT   Patient Name: Andrea Anthony MRN: [id number] DOB:05/26/1946, 77 y.o., female Today's Date: 11/20/2023  END OF SESSION:  PT End of Session - 11/20/23 0859     Visit Number 4    Date for PT Re-Evaluation 12/26/23    Progress Note Due on Visit 10    PT Start Time 0853   pt late   PT Stop Time 0930    PT Time Calculation (min) 37 min    Activity Tolerance Patient tolerated treatment well    Behavior During Therapy [facility] for tasks assessed/performed                Past Medical History:  Diagnosis Date   ALLERGIC RHINITIS 01/07/2008   Qualifier: Diagnosis of  By: Jonny Ruiz MD, Len Blalock    [facility] DEFICIENCY 01/07/2008   Qualifier: Diagnosis of  By: Jonny Ruiz MD, Len Blalock    DIABETES MELLITUS, TYPE II 06/24/2007   Qualifier: Diagnosis of  By: Jonny Ruiz MD, Len Blalock    DIVERTICULOSIS, COLON 01/07/2008   Qualifier: Diagnosis of  By: Jonny Ruiz MD, Len Blalock    Empty sella (HCC) 03/17/2013   GERD 05/24/2009   Qualifier: Diagnosis of  By: Jonny Ruiz MD, Len Blalock    HYPERLIPIDEMIA 06/01/2008   Qualifier: Diagnosis of  By: Jonny Ruiz MD, Len Blalock    HYPERTENSION 06/24/2007   Qualifier: Diagnosis of  By: Jonny Ruiz MD, Len Blalock    Lumbar disc disease 10/08/2012   l4-5 per CT 2008   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   Gastric, 2001   PULMONARY NODULE, RIGHT LOWER LOBE 01/09/2008   Qualifier: Diagnosis of  By: Jonny Ruiz MD, Len Blalock    Umbilical hernia 10/08/2012   Fat containing on CT 2008   Past Surgical History:  Procedure Laterality Date   NO PAST SURGERIES     UPPER GASTROINTESTINAL ENDOSCOPY     Patient Active Problem List   Diagnosis Date Noted   Glaucoma 10/24/2023   Nuclear sclerotic cataract of right eye 01/31/2023   Right leg pain 05/20/2022   Nocturnal leg cramps 05/20/2022   Mastoiditis 05/08/2021   Pain and swelling of left lower leg 07/09/2020   Left knee pain 04/24/2020   External otitis of right ear 11/19/2019   CAP (community acquired pneumonia) 11/20/2018   Acute  bronchitis 11/05/2018   Weight loss 06/05/2018   Spinal stenosis of lumbar region with neurogenic claudication 03/21/2017   Left lumbar radiculopathy 01/30/2017   Right cervical radiculopathy 05/14/2014   Neck pain on right side 09/16/2013   Empty sella (HCC) 03/17/2013   Headache 03/10/2013   Blurred vision, bilateral 03/10/2013   Umbilical hernia 10/08/2012   Lumbar disc disease 10/08/2012   Constipation 10/08/2012   Left shoulder pain 08/05/2012   Encounter for well adult exam with abnormal findings 06/08/2012   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   Backache 04/11/2010   PERIPHERAL EDEMA 04/11/2010   EPISTAXIS, RECURRENT 04/11/2010   GERD 05/24/2009   Menopausal and postmenopausal disorder 05/24/2009   FATIGUE 11/12/2008   COMPUTERIZED TOMOGRAPHY, CHEST, ABNORMAL 11/12/2008   Hyperlipidemia 06/01/2008   PULMONARY NODULE, RIGHT LOWER LOBE 01/09/2008   Iron deficiency anemia 01/07/2008   Allergic rhinitis 01/07/2008   Diverticulosis of colon 01/07/2008   CHEST PAIN 01/07/2008   Type II diabetes mellitus with manifestations (HCC) 06/24/2007   Essential hypertension 06/24/2007    PCP: Oliver Barre, MD  REFERRING PROVIDER: Reino Bellis, DO  REFERRING DIAG: L knee osteoarthritis  THERAPY DIAG:  Difficulty in walking, not elsewhere  classified  Weakness of left quadriceps muscle  Stiffness of left knee, not elsewhere classified  Chronic pain of left knee  Rationale for Evaluation and Treatment: Rehabilitation  ONSET DATE: chronic  SUBJECTIVE:   SUBJECTIVE STATEMENT: Patient's daughter accompanied her for interpretation. Daughter reports that the her strength is improving with lifting her L LE to get into car.  PERTINENT HISTORY: Saw MD with c/o L knee pain, her referred her for outpt PT PAIN:  Are you having pain? Yes: NPRS scale: 7 /10 Pain location: L lateral, posterior knee, calf Pain description: "just hurts" Aggravating factors: walking Relieving factors: started  taking mobic which helped  PRECAUTIONS: None  RED FLAGS: None   WEIGHT BEARING RESTRICTIONS: No  FALLS:  Has patient fallen in last 6 months? No  LIVING ENVIRONMENT: Lives with: lives with their spouse Lives in: House/apartment Stairs: 3 steps outside with rail Has following equipment at home: None  OCCUPATION: retired  PLOF: Independent  PATIENT GOALS: get rid of L knee pain, be able to walk better  NEXT MD VISIT: May 2025 with PCP  OBJECTIVE:  Note: Objective measures were completed at Evaluation unless otherwise noted.  DIAGNOSTIC FINDINGS: CLINICAL DATA:  Left knee pain and edema   EXAM: LEFT KNEE 3 VIEWS   COMPARISON:  None Available.   FINDINGS: No fracture or dislocation. Preserved joint spaces and bone mineralization. Small osteophytes seen of all 3 compartments. Hyperostosis. Question small joint body.   IMPRESSION: Mild degenerative changes.  Question small joint body.  No effusion.     Electronically Signed   By: Karen Kays M.D.   On: 10/02/2023 10:52  PATIENT SURVEYS:  Tegner Lysholm scale 34/100 disability  COGNITION: Overall cognitive status: Within functional limits for tasks assessed     SENSATION: WFL  EDEMA:  Palpable, visible edema L medial knee jt line  MUSCLE LENGTH: NT  POSTURE:  varum B knees, L knee maintains slightly flexed , scooped out, atrophied L distal quadriceps muscle  PALPATION: Decreased L patellar mobility mobility in all directions as compared to R Reports pain with palpation lateral L knee over IT band insertion  LOWER EXTREMITY ROM:  Active ROM Right eval Left eval  Hip flexion    Hip extension    Hip abduction    Hip adduction    Hip internal rotation    Hip external rotation    Knee flexion 116 100 p!  Knee extension  -10  Ankle dorsiflexion  -8  Ankle plantarflexion    Ankle inversion    Ankle eversion     (Blank rows = not tested)  LOWER EXTREMITY MMT: assessed B quads, long arc quad L  with 15 degree lag SLR L with 10 degree lag  Did not assess hamstrings, plantarflexors due to time constraints and language barriers 11/14/23- L hamstrings- 4-/5, PF- 4/5 with manual resistance (6 single leg heel raises)  LOWER EXTREMITY SPECIAL TESTS:  NA  FUNCTIONAL TESTS:  30 seconds chair stand test 8  GAIT: Distance walked: in clinic 26' no device Assistive device utilized: None Level of assistance: Complete Independence Comments: wide base of support and increased lateral trunk side bending with walking, some antalgia L,    TODAY'S TREATMENT:  DATE: 11/20/23 Nustep L6x79min UE/LE NMES to L quads 10 sec on/50 sec off x 12 min  Mini squats at counter x 10  Knee extension 15# x 10 BLE  11/16/23 Nustep L5x56min Seated hip ADD ball squeeze 10x3" Seated L HS curls YTB x 10  Seated L LAQ 2# x 10  Seated marching 2# x 10  Standing heel raises x 10  Patellar medial glides grade IV mobs Tibiofemoral joint mobs AP grade I-II for pain Passive hamstring stretching 2 reps   11/14/23 Nustep L3x3min  Seated LAQ x 15 Seated L QS x 15 Seated L PF with blue TB Assessment of PF and HS strength NMES to L quads 10 sec on/50 sec off x  --pt performing LAQ with each contraction cycle  10/31/23:  Evaluation, instructed pt and daughter in 2 exercises:    Quad sets with towel roll under knee Long arc quads, using theraband to assist with terminal extension 3. Utilized kinesiotaping to lift and support L patella, 2 I pieces extending from tibial tubercle to upper quads PATIENT EDUCATION:  Education details: POC, goals Person educated: Patient and Child(ren) Education method: Explanation, Demonstration, Verbal cues, and Handouts Education comprehension: verbalized understanding, tactile cues required, and needs further education  HOME EXERCISE PROGRAM: See  above  ASSESSMENT:  CLINICAL IMPRESSION: Pt continues to respond well to treatment. Continued with NMES for quad strengthening and facilitation. Followed with quad focused interventions for strengthening, cues needed for full ROM with knee extension. She is also interested in home unit for NMES.  OBJECTIVE IMPAIRMENTS: Abnormal gait, decreased activity tolerance, decreased mobility, difficulty walking, decreased ROM, decreased strength, increased edema, and pain.   ACTIVITY LIMITATIONS: carrying, lifting, bending, squatting, stairs, and locomotion level  PARTICIPATION LIMITATIONS: meal prep, cleaning, laundry, shopping, community activity, and yard work  PERSONAL FACTORS: Age, Behavior pattern, Education, Past/current experiences, and 1-2 comorbidities: DM, HTN, peripheral edema,  are also affecting patient's functional outcome.   REHAB POTENTIAL: Fair    CLINICAL DECISION MAKING: Evolving/moderate complexity  EVALUATION COMPLEXITY: Moderate   GOALS: Goals reviewed with patient? Yes  SHORT TERM GOALS: Target date: 2 weeks 11/14/23 I HEP Baseline: Goal status: MET   LONG TERM GOALS: Target date: 12/26/23  Tegner Lysholm scale improve from 34/100 disability to 24/100 disability regarding L knee Baseline:  Goal status: INITIAL  2.  Able to complete 10 L SLR maintaining no quad  lag for demonstration of better distal quads control and safety with gait Baseline:  Goal status: INITIAL  3.  Long arc quad L improve from 10 degree lag to no lag for demonstration of quads control with walking Baseline:  Goal status: INITIAL  4.  30 sec sit to stand improve from 8 to 12 Baseline:  Goal status: INITIAL   PLAN:  PT FREQUENCY: 2x/week  PT DURATION: 8 weeks  PLANNED INTERVENTIONS: 97110-Therapeutic exercises, 97530- Therapeutic activity, 97112- Neuromuscular re-education, 97535- Self Care, and 40981- Manual therapy  PLAN FOR NEXT SESSION:NMES for muscle re ed L quads,  reinforce and instruct further in home ex program; progress strengthening    Darleene Cleaver, PTA 11/20/2023, 9:41 AM

## 2023-11-22 ENCOUNTER — Other Ambulatory Visit: Payer: Self-pay

## 2023-11-22 ENCOUNTER — Ambulatory Visit (HOSPITAL_BASED_OUTPATIENT_CLINIC_OR_DEPARTMENT_OTHER)
Admission: RE | Admit: 2023-11-22 | Discharge: 2023-11-22 | Disposition: A | Discharge status: Home / Self Care | Payer: Self-pay | Source: Ambulatory Visit | Attending: Internal Medicine | Admitting: Internal Medicine

## 2023-11-22 ENCOUNTER — Ambulatory Visit: Payer: 59

## 2023-11-22 DIAGNOSIS — G8929 Other chronic pain: Secondary | ICD-10-CM | POA: Diagnosis not present

## 2023-11-22 DIAGNOSIS — R262 Difficulty in walking, not elsewhere classified: Secondary | ICD-10-CM | POA: Diagnosis not present

## 2023-11-22 DIAGNOSIS — R9431 Abnormal electrocardiogram [ECG] [EKG]: Secondary | ICD-10-CM | POA: Insufficient documentation

## 2023-11-22 DIAGNOSIS — E7800 Pure hypercholesterolemia, unspecified: Secondary | ICD-10-CM | POA: Insufficient documentation

## 2023-11-22 DIAGNOSIS — M25662 Stiffness of left knee, not elsewhere classified: Secondary | ICD-10-CM | POA: Diagnosis not present

## 2023-11-22 DIAGNOSIS — E118 Type 2 diabetes mellitus with unspecified complications: Secondary | ICD-10-CM | POA: Insufficient documentation

## 2023-11-22 DIAGNOSIS — M25562 Pain in left knee: Secondary | ICD-10-CM | POA: Diagnosis not present

## 2023-11-22 DIAGNOSIS — M6281 Muscle weakness (generalized): Secondary | ICD-10-CM

## 2023-11-22 DIAGNOSIS — I10 Essential (primary) hypertension: Secondary | ICD-10-CM | POA: Insufficient documentation

## 2023-11-22 NOTE — Therapy (Signed)
OUTPATIENT PHYSICAL THERAPY TREATMENT   Patient Name: Andrea Anthony MRN: [id number] DOB:16-Apr-1946, 77 y.o., female Today's Date: 11/22/2023  END OF SESSION:  PT End of Session - 11/22/23 0807     Visit Number 5    Date for PT Re-Evaluation 12/26/23    Progress Note Due on Visit 10    PT Start Time 0800    PT Stop Time 0845    PT Time Calculation (min) 45 min    Activity Tolerance Patient tolerated treatment well    Behavior During Therapy [facility] for tasks assessed/performed                 Past Medical History:  Diagnosis Date   ALLERGIC RHINITIS 01/07/2008   Qualifier: Diagnosis of  By: Jonny Ruiz MD, Len Blalock    [facility] DEFICIENCY 01/07/2008   Qualifier: Diagnosis of  By: Jonny Ruiz MD, Len Blalock    DIABETES MELLITUS, TYPE II 06/24/2007   Qualifier: Diagnosis of  By: Jonny Ruiz MD, Len Blalock    DIVERTICULOSIS, COLON 01/07/2008   Qualifier: Diagnosis of  By: Jonny Ruiz MD, Len Blalock    Empty sella (HCC) 03/17/2013   GERD 05/24/2009   Qualifier: Diagnosis of  By: Jonny Ruiz MD, Len Blalock    HYPERLIPIDEMIA 06/01/2008   Qualifier: Diagnosis of  By: Jonny Ruiz MD, Len Blalock    HYPERTENSION 06/24/2007   Qualifier: Diagnosis of  By: Jonny Ruiz MD, Len Blalock    Lumbar disc disease 10/08/2012   l4-5 per CT 2008   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   Gastric, 2001   PULMONARY NODULE, RIGHT LOWER LOBE 01/09/2008   Qualifier: Diagnosis of  By: Jonny Ruiz MD, Len Blalock    Umbilical hernia 10/08/2012   Fat containing on CT 2008   Past Surgical History:  Procedure Laterality Date   NO PAST SURGERIES     UPPER GASTROINTESTINAL ENDOSCOPY     Patient Active Problem List   Diagnosis Date Noted   Glaucoma 10/24/2023   Nuclear sclerotic cataract of right eye 01/31/2023   Right leg pain 05/20/2022   Nocturnal leg cramps 05/20/2022   Mastoiditis 05/08/2021   Pain and swelling of left lower leg 07/09/2020   Left knee pain 04/24/2020   External otitis of right ear 11/19/2019   CAP (community acquired pneumonia) 11/20/2018   Acute bronchitis  11/05/2018   Weight loss 06/05/2018   Spinal stenosis of lumbar region with neurogenic claudication 03/21/2017   Left lumbar radiculopathy 01/30/2017   Right cervical radiculopathy 05/14/2014   Neck pain on right side 09/16/2013   Empty sella (HCC) 03/17/2013   Headache 03/10/2013   Blurred vision, bilateral 03/10/2013   Umbilical hernia 10/08/2012   Lumbar disc disease 10/08/2012   Constipation 10/08/2012   Left shoulder pain 08/05/2012   Encounter for well adult exam with abnormal findings 06/08/2012   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   Backache 04/11/2010   PERIPHERAL EDEMA 04/11/2010   EPISTAXIS, RECURRENT 04/11/2010   GERD 05/24/2009   Menopausal and postmenopausal disorder 05/24/2009   FATIGUE 11/12/2008   COMPUTERIZED TOMOGRAPHY, CHEST, ABNORMAL 11/12/2008   Hyperlipidemia 06/01/2008   PULMONARY NODULE, RIGHT LOWER LOBE 01/09/2008   Iron deficiency anemia 01/07/2008   Allergic rhinitis 01/07/2008   Diverticulosis of colon 01/07/2008   CHEST PAIN 01/07/2008   Type II diabetes mellitus with manifestations (HCC) 06/24/2007   Essential hypertension 06/24/2007    PCP: Oliver Barre, MD  REFERRING PROVIDER: Reino Bellis, DO  REFERRING DIAG: L knee osteoarthritis  THERAPY DIAG:  Difficulty in walking, not elsewhere classified  Weakness of left quadriceps muscle  Stiffness of left knee, not elsewhere classified  Chronic pain of left knee  Rationale for Evaluation and Treatment: Rehabilitation  ONSET DATE: chronic  SUBJECTIVE:   SUBJECTIVE STATEMENT: Patient's daughter accompanied her for interpretation. Daughter reports that the her strength is improving with lifting her L LE to get into car, also pt not c/o as much pain L knee  PERTINENT HISTORY: Saw MD with c/o L knee pain, her referred her for outpt PT PAIN:  Are you having pain? Yes: NPRS scale: 7 /10 Pain location: L lateral, posterior knee, calf Pain description: "just hurts" Aggravating factors:  walking Relieving factors: started taking mobic which helped  PRECAUTIONS: None  RED FLAGS: None   WEIGHT BEARING RESTRICTIONS: No  FALLS:  Has patient fallen in last 6 months? No  LIVING ENVIRONMENT: Lives with: lives with their spouse Lives in: House/apartment Stairs: 3 steps outside with rail Has following equipment at home: None  OCCUPATION: retired  PLOF: Independent  PATIENT GOALS: get rid of L knee pain, be able to walk better  NEXT MD VISIT: May 2025 with PCP  OBJECTIVE:  Note: Objective measures were completed at Evaluation unless otherwise noted.  DIAGNOSTIC FINDINGS: CLINICAL DATA:  Left knee pain and edema   EXAM: LEFT KNEE 3 VIEWS   COMPARISON:  None Available.   FINDINGS: No fracture or dislocation. Preserved joint spaces and bone mineralization. Small osteophytes seen of all 3 compartments. Hyperostosis. Question small joint body.   IMPRESSION: Mild degenerative changes.  Question small joint body.  No effusion.     Electronically Signed   By: Karen Kays M.D.   On: 10/02/2023 10:52  PATIENT SURVEYS:  Tegner Lysholm scale 34/100 disability  COGNITION: Overall cognitive status: Within functional limits for tasks assessed     SENSATION: WFL  EDEMA:  Palpable, visible edema L medial knee jt line  MUSCLE LENGTH: NT  POSTURE:  varum B knees, L knee maintains slightly flexed , scooped out, atrophied L distal quadriceps muscle  PALPATION: Decreased L patellar mobility mobility in all directions as compared to R Reports pain with palpation lateral L knee over IT band insertion  LOWER EXTREMITY ROM:  Active ROM Right eval Left eval  Hip flexion    Hip extension    Hip abduction    Hip adduction    Hip internal rotation    Hip external rotation    Knee flexion 116 100 p!  Knee extension  -10  Ankle dorsiflexion  -8  Ankle plantarflexion    Ankle inversion    Ankle eversion     (Blank rows = not tested)  LOWER EXTREMITY  MMT: assessed B quads, long arc quad L with 15 degree lag SLR L with 10 degree lag  Did not assess hamstrings, plantarflexors due to time constraints and language barriers 11/14/23- L hamstrings- 4-/5, PF- 4/5 with manual resistance (6 single leg heel raises)  LOWER EXTREMITY SPECIAL TESTS:  NA  FUNCTIONAL TESTS:  30 seconds chair stand test 8  GAIT: Distance walked: in clinic 34' no device Assistive device utilized: None Level of assistance: Complete Independence Comments: wide base of support and increased lateral trunk side bending with walking, some antalgia L,    TODAY'S TREATMENT:  DATE: 11/22/23: Nustep 6 min L5 UE/ LE NMES 4 pads L quads for 8 min, with LAQ's 10 sec hold, 30 sec off Inst again in quad sets with towel roll under L knee SLR 10 reps frequent cues to avoid lag Also forward step ups onto 4" step 10 x with B UE support  11/20/23 Nustep L6x23min UE/LE NMES to L quads 10 sec on/50 sec off x 12 min  Mini squats at counter x 10  Knee extension 15# x 10 BLE  11/16/23 Nustep L5x59min Seated hip ADD ball squeeze 10x3" Seated L HS curls YTB x 10  Seated L LAQ 2# x 10  Seated marching 2# x 10  Standing heel raises x 10  Patellar medial glides grade IV mobs Tibiofemoral joint mobs AP grade I-II for pain Passive hamstring stretching 2 reps   11/14/23 Nustep L3x33min  Seated LAQ x 15 Seated L QS x 15 Seated L PF with blue TB Assessment of PF and HS strength NMES to L quads 10 sec on/50 sec off x  --pt performing LAQ with each contraction cycle  10/31/23:  Evaluation, instructed pt and daughter in 2 exercises:    Quad sets with towel roll under knee Long arc quads, using theraband to assist with terminal extension 3. Utilized kinesiotaping to lift and support L patella, 2 I pieces extending from tibial tubercle to upper  quads PATIENT EDUCATION:  Education details: POC, goals Person educated: Patient and Child(ren) Education method: Explanation, Demonstration, Verbal cues, and Handouts Education comprehension: verbalized understanding, tactile cues required, and needs further education  HOME EXERCISE PROGRAM: Access Code: [email or web address] URL: [email or web address] Date: 11/22/2023 Prepared by: Linton Rump Aitan Rossbach  Exercises - Seated Long Arc Quad  - 1 x daily - 3 x weekly - 3 sets - 10 reps - Supine Quad Set  - 1 x daily - 3 x weekly - 3 sets - 10 reps - Small Range Straight Leg Raise  - 1 x daily - 3 x weekly - 3 sets - 10 reps See above  ASSESSMENT:  CLINICAL IMPRESSION: Pt continues to respond well to treatment. Continued with NMES for quad strengthening and facilitation. Followed with quad focused interventions for strengthening, cues needed for full ROM with knee extension. Discussed Rx plan with pt and daughter, pt intends to return to gym which she used to attend prior to this injury.  Daughter wants very specific instruction prior to pt going back to gynm.  Her SLR is much improved. OBJECTIVE IMPAIRMENTS: Abnormal gait, decreased activity tolerance, decreased mobility, difficulty walking, decreased ROM, decreased strength, increased edema, and pain.   ACTIVITY LIMITATIONS: carrying, lifting, bending, squatting, stairs, and locomotion level  PARTICIPATION LIMITATIONS: meal prep, cleaning, laundry, shopping, community activity, and yard work  PERSONAL FACTORS: Age, Behavior pattern, Education, Past/current experiences, and 1-2 comorbidities: DM, HTN, peripheral edema,  are also affecting patient's functional outcome.   REHAB POTENTIAL: Fair    CLINICAL DECISION MAKING: Evolving/moderate complexity  EVALUATION COMPLEXITY: Moderate   GOALS: Goals reviewed with patient? Yes  SHORT TERM GOALS: Target date: 2 weeks 11/14/23 I HEP Baseline: Goal status: MET   LONG TERM GOALS: Target date:  12/26/23  Tegner Lysholm scale improve from 34/100 disability to 24/100 disability regarding L knee Baseline:  Goal status: INITIAL  2.  Able to complete 10 L SLR maintaining no quad  lag for demonstration of better distal quads control and safety with gait Baseline:  Goal status: INITIAL  3.  Long arc quad L improve from  10 degree lag to no lag for demonstration of quads control with walking Baseline:  Goal status: progressing 11/22/23: 0 degree lag noted today  4.  30 sec sit to stand improve from 8 to 12 Baseline:  Goal status: INITIAL   PLAN:  PT FREQUENCY: 2x/week  PT DURATION: 8 weeks  PLANNED INTERVENTIONS: 97110-Therapeutic exercises, 97530- Therapeutic activity, 97112- Neuromuscular re-education, 97535- Self Care, and 41324- Manual therapy  PLAN FOR NEXT SESSION:NMES for muscle re ed L quads, reinforce and instruct further in home ex program; progress strengthening    Miriam Kestler L Ketina Mars, PT,DPT, OCS 11/22/2023, 1:00 PM

## 2023-11-26 ENCOUNTER — Ambulatory Visit: Payer: 59

## 2023-11-26 DIAGNOSIS — M6281 Muscle weakness (generalized): Secondary | ICD-10-CM | POA: Diagnosis not present

## 2023-11-26 DIAGNOSIS — M25562 Pain in left knee: Secondary | ICD-10-CM | POA: Diagnosis not present

## 2023-11-26 DIAGNOSIS — M25662 Stiffness of left knee, not elsewhere classified: Secondary | ICD-10-CM

## 2023-11-26 DIAGNOSIS — R262 Difficulty in walking, not elsewhere classified: Secondary | ICD-10-CM

## 2023-11-26 DIAGNOSIS — G8929 Other chronic pain: Secondary | ICD-10-CM | POA: Diagnosis not present

## 2023-11-26 NOTE — Therapy (Signed)
OUTPATIENT PHYSICAL THERAPY TREATMENT   Patient Name: Andrea Anthony MRN: [id number] DOB:[birth year], 77 y.o., female Today's Date: 11/26/2023  END OF SESSION:  PT End of Session - 11/26/23 0812     Visit Number 6    Date for PT Re-Evaluation 12/26/23    Progress Note Due on Visit 10    PT Start Time 0806   pt late   PT Stop Time 0845    PT Time Calculation (min) 39 min    Activity Tolerance Patient tolerated treatment well    Behavior During Therapy [facility] for tasks assessed/performed                  Past Medical History:  Diagnosis Date   ALLERGIC RHINITIS 01/07/2008   Qualifier: Diagnosis of  By: Jonny Ruiz MD, Len Blalock    [facility] DEFICIENCY 01/07/2008   Qualifier: Diagnosis of  By: Jonny Ruiz MD, Len Blalock    DIABETES MELLITUS, TYPE II 06/24/2007   Qualifier: Diagnosis of  By: Jonny Ruiz MD, Len Blalock    DIVERTICULOSIS, COLON 01/07/2008   Qualifier: Diagnosis of  By: Jonny Ruiz MD, Len Blalock    Empty sella (HCC) 03/17/2013   GERD 05/24/2009   Qualifier: Diagnosis of  By: Jonny Ruiz MD, Len Blalock    HYPERLIPIDEMIA 06/01/2008   Qualifier: Diagnosis of  By: Jonny Ruiz MD, Len Blalock    HYPERTENSION 06/24/2007   Qualifier: Diagnosis of  By: Jonny Ruiz MD, Len Blalock    Lumbar disc disease 10/08/2012   l4-5 per CT 2008   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   Gastric, 2001   PULMONARY NODULE, RIGHT LOWER LOBE 01/09/2008   Qualifier: Diagnosis of  By: Jonny Ruiz MD, Len Blalock    Umbilical hernia 10/08/2012   Fat containing on CT 2008   Past Surgical History:  Procedure Laterality Date   NO PAST SURGERIES     UPPER GASTROINTESTINAL ENDOSCOPY     Patient Active Problem List   Diagnosis Date Noted   Glaucoma 10/24/2023   Nuclear sclerotic cataract of right eye 01/31/2023   Right leg pain 05/20/2022   Nocturnal leg cramps 05/20/2022   Mastoiditis 05/08/2021   Pain and swelling of left lower leg 07/09/2020   Left knee pain 04/24/2020   External otitis of right ear 11/19/2019   CAP (community acquired pneumonia) 11/20/2018   Acute  bronchitis 11/05/2018   Weight loss 06/05/2018   Spinal stenosis of lumbar region with neurogenic claudication 03/21/2017   Left lumbar radiculopathy 01/30/2017   Right cervical radiculopathy 05/14/2014   Neck pain on right side 09/16/2013   Empty sella (HCC) 03/17/2013   Headache 03/10/2013   Blurred vision, bilateral 03/10/2013   Umbilical hernia 10/08/2012   Lumbar disc disease 10/08/2012   Constipation 10/08/2012   Left shoulder pain 08/05/2012   Encounter for well adult exam with abnormal findings 06/08/2012   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   Backache 04/11/2010   PERIPHERAL EDEMA 04/11/2010   EPISTAXIS, RECURRENT 04/11/2010   GERD 05/24/2009   Menopausal and postmenopausal disorder 05/24/2009   FATIGUE 11/12/2008   COMPUTERIZED TOMOGRAPHY, CHEST, ABNORMAL 11/12/2008   Hyperlipidemia 06/01/2008   PULMONARY NODULE, RIGHT LOWER LOBE 01/09/2008   Iron deficiency anemia 01/07/2008   Allergic rhinitis 01/07/2008   Diverticulosis of colon 01/07/2008   CHEST PAIN 01/07/2008   Type II diabetes mellitus with manifestations (HCC) 06/24/2007   Essential hypertension 06/24/2007    PCP: Oliver Barre, MD  REFERRING PROVIDER: Reino Bellis, DO  REFERRING DIAG: L knee osteoarthritis  THERAPY DIAG:  Difficulty in walking,  not elsewhere classified  Weakness of left quadriceps muscle  Stiffness of left knee, not elsewhere classified  Chronic pain of left knee  Rationale for Evaluation and Treatment: Rehabilitation  ONSET DATE: chronic  SUBJECTIVE:   SUBJECTIVE STATEMENT: Not too much pain today.  PERTINENT HISTORY: Saw MD with c/o L knee pain, her referred her for outpt PT PAIN:  Are you having pain? Yes: NPRS scale: pt unable to give number/10 Pain location: L lateral, posterior knee, calf Pain description: "just hurts" Aggravating factors: walking Relieving factors: started taking mobic which helped  PRECAUTIONS: None  RED FLAGS: None   WEIGHT BEARING  RESTRICTIONS: No  FALLS:  Has patient fallen in last 6 months? No  LIVING ENVIRONMENT: Lives with: lives with their spouse Lives in: House/apartment Stairs: 3 steps outside with rail Has following equipment at home: None  OCCUPATION: retired  PLOF: Independent  PATIENT GOALS: get rid of L knee pain, be able to walk better  NEXT MD VISIT: May 2025 with PCP  OBJECTIVE:  Note: Objective measures were completed at Evaluation unless otherwise noted.  DIAGNOSTIC FINDINGS: CLINICAL DATA:  Left knee pain and edema   EXAM: LEFT KNEE 3 VIEWS   COMPARISON:  None Available.   FINDINGS: No fracture or dislocation. Preserved joint spaces and bone mineralization. Small osteophytes seen of all 3 compartments. Hyperostosis. Question small joint body.   IMPRESSION: Mild degenerative changes.  Question small joint body.  No effusion.     Electronically Signed   By: Karen Kays M.D.   On: 10/02/2023 10:52  PATIENT SURVEYS:  Tegner Lysholm scale 34/100 disability  COGNITION: Overall cognitive status: Within functional limits for tasks assessed     SENSATION: WFL  EDEMA:  Palpable, visible edema L medial knee jt line  MUSCLE LENGTH: NT  POSTURE:  varum B knees, L knee maintains slightly flexed , scooped out, atrophied L distal quadriceps muscle  PALPATION: Decreased L patellar mobility mobility in all directions as compared to R Reports pain with palpation lateral L knee over IT band insertion  LOWER EXTREMITY ROM:  Active ROM Right eval Left eval  Hip flexion    Hip extension    Hip abduction    Hip adduction    Hip internal rotation    Hip external rotation    Knee flexion 116 100 p!  Knee extension  -10  Ankle dorsiflexion  -8  Ankle plantarflexion    Ankle inversion    Ankle eversion     (Blank rows = not tested)  LOWER EXTREMITY MMT: assessed B quads, long arc quad L with 15 degree lag SLR L with 10 degree lag  Did not assess hamstrings,  plantarflexors due to time constraints and language barriers 11/14/23- L hamstrings- 4-/5, PF- 4/5 with manual resistance (6 single leg heel raises)  LOWER EXTREMITY SPECIAL TESTS:  NA  FUNCTIONAL TESTS:  30 seconds chair stand test 8  GAIT: Distance walked: in clinic 27' no device Assistive device utilized: None Level of assistance: Complete Independence Comments: wide base of support and increased lateral trunk side bending with walking, some antalgia L,    TODAY'S TREATMENT:  DATE: 11/26/23 Nustep L6x59min UE/LE Mini squats 2x10- cues to avoid knees over toes Standing hip abduction x 10 B 4' step downs with RLE 2x10 Supine L SLR x 10 - tactile and verb cues to avoid quad lag Manually resisted L SAQ x 10  Post knee capsule stretch. AP mobs for knee grade II-III  11/22/23: Nustep 6 min L5 UE/ LE NMES 4 pads L quads for 8 min, with LAQ's 10 sec hold, 30 sec off Inst again in quad sets with towel roll under L knee SLR 10 reps frequent cues to avoid lag Also forward step ups onto 4" step 10 x with B UE support  11/20/23 Nustep L6x25min UE/LE NMES to L quads 10 sec on/50 sec off x 12 min  Mini squats at counter x 10  Knee extension 15# x 10 BLE  11/16/23 Nustep L5x54min Seated hip ADD ball squeeze 10x3" Seated L HS curls YTB x 10  Seated L LAQ 2# x 10  Seated marching 2# x 10  Standing heel raises x 10  Patellar medial glides grade IV mobs Tibiofemoral joint mobs AP grade I-II for pain Passive hamstring stretching 2 reps   11/14/23 Nustep L3x60min  Seated LAQ x 15 Seated L QS x 15 Seated L PF with blue TB Assessment of PF and HS strength NMES to L quads 10 sec on/50 sec off x  --pt performing LAQ with each contraction cycle  10/31/23:  Evaluation, instructed pt and daughter in 2 exercises:    Quad sets with towel roll under knee Long  arc quads, using theraband to assist with terminal extension 3. Utilized kinesiotaping to lift and support L patella, 2 I pieces extending from tibial tubercle to upper quads PATIENT EDUCATION:  Education details: POC, goals Person educated: Patient and Child(ren) Education method: Explanation, Demonstration, Verbal cues, and Handouts Education comprehension: verbalized understanding, tactile cues required, and needs further education  HOME EXERCISE PROGRAM: Access Code: [email or web address] URL: [email or web address] Date: 11/22/2023 Prepared by: Linton Rump Speaks  Exercises - Seated Long Arc Quad  - 1 x daily - 3 x weekly - 3 sets - 10 reps - Supine Quad Set  - 1 x daily - 3 x weekly - 3 sets - 10 reps - Small Range Straight Leg Raise  - 1 x daily - 3 x weekly - 3 sets - 10 reps See above  ASSESSMENT:  CLINICAL IMPRESSION: We were able to progress strengthening interventions with no issues. Added more loading exercises for the knees. Pt compensates to avoid CKC knee flexion with steps. She still shows quad lag with SLR. She continues to benefit from more strengthening interventions and NMES.   OBJECTIVE IMPAIRMENTS: Abnormal gait, decreased activity tolerance, decreased mobility, difficulty walking, decreased ROM, decreased strength, increased edema, and pain.   ACTIVITY LIMITATIONS: carrying, lifting, bending, squatting, stairs, and locomotion level  PARTICIPATION LIMITATIONS: meal prep, cleaning, laundry, shopping, community activity, and yard work  PERSONAL FACTORS: Age, Behavior pattern, Education, Past/current experiences, and 1-2 comorbidities: DM, HTN, peripheral edema,  are also affecting patient's functional outcome.   REHAB POTENTIAL: Fair    CLINICAL DECISION MAKING: Evolving/moderate complexity  EVALUATION COMPLEXITY: Moderate   GOALS: Goals reviewed with patient? Yes  SHORT TERM GOALS: Target date: 2 weeks 11/14/23 I HEP Baseline: Goal status: MET   LONG TERM  GOALS: Target date: 12/26/23  Tegner Lysholm scale improve from 34/100 disability to 24/100 disability regarding L knee Baseline:  Goal status: INITIAL  2.  Able to complete 10 L  SLR maintaining no quad  lag for demonstration of better distal quads control and safety with gait Baseline:  Goal status: INITIAL  3.  Long arc quad L improve from 10 degree lag to no lag for demonstration of quads control with walking Baseline:  Goal status: progressing 11/22/23: 0 degree lag noted today  4.  30 sec sit to stand improve from 8 to 12 Baseline:  Goal status: INITIAL   PLAN:  PT FREQUENCY: 2x/week  PT DURATION: 8 weeks  PLANNED INTERVENTIONS: 97110-Therapeutic exercises, 97530- Therapeutic activity, 97112- Neuromuscular re-education, 97535- Self Care, and 40981- Manual therapy  PLAN FOR NEXT SESSION:NMES for muscle re ed L quads, reinforce and instruct further in home ex program; progress strengthening; try to increase WB through knees    Yelina Sarratt L Nicolis Boody, PTA 11/26/2023, 8:49 AM

## 2023-11-29 ENCOUNTER — Other Ambulatory Visit (HOSPITAL_BASED_OUTPATIENT_CLINIC_OR_DEPARTMENT_OTHER): Payer: 59

## 2023-11-29 ENCOUNTER — Other Ambulatory Visit: Payer: Self-pay

## 2023-11-29 ENCOUNTER — Ambulatory Visit: Payer: 59

## 2023-11-29 DIAGNOSIS — G8929 Other chronic pain: Secondary | ICD-10-CM

## 2023-11-29 DIAGNOSIS — M542 Cervicalgia: Secondary | ICD-10-CM | POA: Diagnosis not present

## 2023-11-29 DIAGNOSIS — M25662 Stiffness of left knee, not elsewhere classified: Secondary | ICD-10-CM

## 2023-11-29 DIAGNOSIS — M62838 Other muscle spasm: Secondary | ICD-10-CM | POA: Diagnosis not present

## 2023-11-29 DIAGNOSIS — R262 Difficulty in walking, not elsewhere classified: Secondary | ICD-10-CM

## 2023-11-29 DIAGNOSIS — R0981 Nasal congestion: Secondary | ICD-10-CM | POA: Diagnosis not present

## 2023-11-29 DIAGNOSIS — M6281 Muscle weakness (generalized): Secondary | ICD-10-CM

## 2023-11-29 DIAGNOSIS — R051 Acute cough: Secondary | ICD-10-CM | POA: Diagnosis not present

## 2023-11-29 DIAGNOSIS — J029 Acute pharyngitis, unspecified: Secondary | ICD-10-CM | POA: Diagnosis not present

## 2023-11-29 NOTE — Therapy (Signed)
OUTPATIENT PHYSICAL THERAPY TREATMENT   Patient Name: Andrea Anthony MRN: [id number] DOB:1946-08-07, 77 y.o., female Today's Date: 11/29/2023  END OF SESSION:  PT End of Session - 11/29/23 0810     Visit Number 7    Date for PT Re-Evaluation 12/26/23    Progress Note Due on Visit 10    PT Start Time 0805    PT Stop Time 0845    PT Time Calculation (min) 40 min    Activity Tolerance Patient tolerated treatment well    Behavior During Therapy [facility] for tasks assessed/performed                   Past Medical History:  Diagnosis Date   ALLERGIC RHINITIS 01/07/2008   Qualifier: Diagnosis of  By: Jonny Ruiz MD, Len Blalock    [facility] DEFICIENCY 01/07/2008   Qualifier: Diagnosis of  By: Jonny Ruiz MD, Len Blalock    DIABETES MELLITUS, TYPE II 06/24/2007   Qualifier: Diagnosis of  By: Jonny Ruiz MD, Len Blalock    DIVERTICULOSIS, COLON 01/07/2008   Qualifier: Diagnosis of  By: Jonny Ruiz MD, Len Blalock    Empty sella (HCC) 03/17/2013   GERD 05/24/2009   Qualifier: Diagnosis of  By: Jonny Ruiz MD, Len Blalock    HYPERLIPIDEMIA 06/01/2008   Qualifier: Diagnosis of  By: Jonny Ruiz MD, Len Blalock    HYPERTENSION 06/24/2007   Qualifier: Diagnosis of  By: Jonny Ruiz MD, Len Blalock    Lumbar disc disease 10/08/2012   l4-5 per CT 2008   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   Gastric, 2001   PULMONARY NODULE, RIGHT LOWER LOBE 01/09/2008   Qualifier: Diagnosis of  By: Jonny Ruiz MD, Len Blalock    Umbilical hernia 10/08/2012   Fat containing on CT 2008   Past Surgical History:  Procedure Laterality Date   NO PAST SURGERIES     UPPER GASTROINTESTINAL ENDOSCOPY     Patient Active Problem List   Diagnosis Date Noted   Glaucoma 10/24/2023   Nuclear sclerotic cataract of right eye 01/31/2023   Right leg pain 05/20/2022   Nocturnal leg cramps 05/20/2022   Mastoiditis 05/08/2021   Pain and swelling of left lower leg 07/09/2020   Left knee pain 04/24/2020   External otitis of right ear 11/19/2019   CAP (community acquired pneumonia) 11/20/2018   Acute  bronchitis 11/05/2018   Weight loss 06/05/2018   Spinal stenosis of lumbar region with neurogenic claudication 03/21/2017   Left lumbar radiculopathy 01/30/2017   Right cervical radiculopathy 05/14/2014   Neck pain on right side 09/16/2013   Empty sella (HCC) 03/17/2013   Headache 03/10/2013   Blurred vision, bilateral 03/10/2013   Umbilical hernia 10/08/2012   Lumbar disc disease 10/08/2012   Constipation 10/08/2012   Left shoulder pain 08/05/2012   Encounter for well adult exam with abnormal findings 06/08/2012   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   Backache 04/11/2010   PERIPHERAL EDEMA 04/11/2010   EPISTAXIS, RECURRENT 04/11/2010   GERD 05/24/2009   Menopausal and postmenopausal disorder 05/24/2009   FATIGUE 11/12/2008   COMPUTERIZED TOMOGRAPHY, CHEST, ABNORMAL 11/12/2008   Hyperlipidemia 06/01/2008   PULMONARY NODULE, RIGHT LOWER LOBE 01/09/2008   Iron deficiency anemia 01/07/2008   Allergic rhinitis 01/07/2008   Diverticulosis of colon 01/07/2008   CHEST PAIN 01/07/2008   Type II diabetes mellitus with manifestations (HCC) 06/24/2007   Essential hypertension 06/24/2007    PCP: Oliver Barre, MD  REFERRING PROVIDER: Reino Bellis, DO  REFERRING DIAG: L knee osteoarthritis  THERAPY DIAG:  Difficulty in walking, not elsewhere  classified  Weakness of left quadriceps muscle  Stiffness of left knee, not elsewhere classified  Chronic pain of left knee  Rationale for Evaluation and Treatment: Rehabilitation  ONSET DATE: chronic  SUBJECTIVE:   SUBJECTIVE STATEMENT: Not too much pain today.  PERTINENT HISTORY: Saw MD with c/o L knee pain, her referred her for outpt PT PAIN:  Are you having pain? Yes: NPRS scale: pt unable to give number/10 Pain location: L lateral, posterior knee, calf Pain description: "just hurts" Aggravating factors: walking Relieving factors: started taking mobic which helped  PRECAUTIONS: None  RED FLAGS: None   WEIGHT BEARING  RESTRICTIONS: No  FALLS:  Has patient fallen in last 6 months? No  LIVING ENVIRONMENT: Lives with: lives with their spouse Lives in: House/apartment Stairs: 3 steps outside with rail Has following equipment at home: None  OCCUPATION: retired  PLOF: Independent  PATIENT GOALS: get rid of L knee pain, be able to walk better  NEXT MD VISIT: May 2025 with PCP  OBJECTIVE:  Note: Objective measures were completed at Evaluation unless otherwise noted.  DIAGNOSTIC FINDINGS: CLINICAL DATA:  Left knee pain and edema   EXAM: LEFT KNEE 3 VIEWS   COMPARISON:  None Available.   FINDINGS: No fracture or dislocation. Preserved joint spaces and bone mineralization. Small osteophytes seen of all 3 compartments. Hyperostosis. Question small joint body.   IMPRESSION: Mild degenerative changes.  Question small joint body.  No effusion.     Electronically Signed   By: Karen Kays M.D.   On: 10/02/2023 10:52  PATIENT SURVEYS:  Tegner Lysholm scale 34/100 disability  COGNITION: Overall cognitive status: Within functional limits for tasks assessed     SENSATION: WFL  EDEMA:  Palpable, visible edema L medial knee jt line  MUSCLE LENGTH: NT  POSTURE:  varum B knees, L knee maintains slightly flexed , scooped out, atrophied L distal quadriceps muscle  PALPATION: Decreased L patellar mobility mobility in all directions as compared to R Reports pain with palpation lateral L knee over IT band insertion  LOWER EXTREMITY ROM:  Active ROM Right eval Left eval  Hip flexion    Hip extension    Hip abduction    Hip adduction    Hip internal rotation    Hip external rotation    Knee flexion 116 100 p!  Knee extension  -10  Ankle dorsiflexion  -8  Ankle plantarflexion    Ankle inversion    Ankle eversion     (Blank rows = not tested)  LOWER EXTREMITY MMT: assessed B quads, long arc quad L with 15 degree lag SLR L with 10 degree lag  Did not assess hamstrings,  plantarflexors due to time constraints and language barriers 11/14/23- L hamstrings- 4-/5, PF- 4/5 with manual resistance (6 single leg heel raises)  LOWER EXTREMITY SPECIAL TESTS:  NA  FUNCTIONAL TESTS:  30 seconds chair stand test 8  GAIT: Distance walked: in clinic 56' no device Assistive device utilized: None Level of assistance: Complete Independence Comments: wide base of support and increased lateral trunk side bending with walking, some antalgia L,    TODAY'S TREATMENT:  DATE: 11/29/23: Recumbent cycle 6 min L3 Supine for AP tibial glides medially with tibial adduction force, for extension ROM, 3 bouts, 7 reps each SLR L 10 x Standing B heel drops with forefeet on 2" book, and B heel raises 10 reps Lateral R heel taps, from 4" step, 2 x 10 Seated long arc quads 5# 10 x 3 Seated L hamstring curls, red band, 10 x 3 Prone for deep tissue, cross friction massage, L plantarflexors and L hamstrings  Supine for manual stretching for L hamstrings, with overpressure by PT, sustained holds 30 to 45 sec   11/26/23 Nustep L6x74min UE/LE Mini squats 2x10- cues to avoid knees over toes Standing hip abduction x 10 B 4' step downs with RLE 2x10 Supine L SLR x 10 - tactile and verb cues to avoid quad lag Manually resisted L SAQ x 10  Post knee capsule stretch. AP mobs for knee grade II-III  11/22/23: Nustep 6 min L5 UE/ LE NMES 4 pads L quads for 8 min, with LAQ's 10 sec hold, 30 sec off Inst again in quad sets with towel roll under L knee SLR 10 reps frequent cues to avoid lag Also forward step ups onto 4" step 10 x with B UE support  11/20/23 Nustep L6x57min UE/LE NMES to L quads 10 sec on/50 sec off x 12 min  Mini squats at counter x 10  Knee extension 15# x 10 BLE  11/16/23 Nustep L5x59min Seated hip ADD ball squeeze 10x3" Seated L HS curls YTB x 10   Seated L LAQ 2# x 10  Seated marching 2# x 10  Standing heel raises x 10  Patellar medial glides grade IV mobs Tibiofemoral joint mobs AP grade I-II for pain Passive hamstring stretching 2 reps   11/14/23 Nustep L3x2min  Seated LAQ x 15 Seated L QS x 15 Seated L PF with blue TB Assessment of PF and HS strength NMES to L quads 10 sec on/50 sec off x  --pt performing LAQ with each contraction cycle  10/31/23:  Evaluation, instructed pt and daughter in 2 exercises:    Quad sets with towel roll under knee Long arc quads, using theraband to assist with terminal extension 3. Utilized kinesiotaping to lift and support L patella, 2 I pieces extending from tibial tubercle to upper quads PATIENT EDUCATION:  Education details: POC, goals Person educated: Patient and Child(ren) Education method: Explanation, Demonstration, Verbal cues, and Handouts Education comprehension: verbalized understanding, tactile cues required, and needs further education  HOME EXERCISE PROGRAM: Access Code: [email or web address] URL: [email or web address] Date: 11/22/2023 Prepared by: Caralee Ates  Exercises - Seated Long Arc Quad  - 1 x daily - 3 x weekly - 3 sets - 10 reps - Supine Quad Set  - 1 x daily - 3 x weekly - 3 sets - 10 reps - Small Range Straight Leg Raise  - 1 x daily - 3 x weekly - 3 sets - 10 reps See above  ASSESSMENT:  CLINICAL IMPRESSION: The patient reports overall improved pain L knee. Unable to completely extend L knee so focused more on capsular stretching and soft tissue work post leg musculature.  She is regaining strength L quads, tolerating more intensive strengthening and her gait is demonstrating less antalgia.  OBJECTIVE IMPAIRMENTS: Abnormal gait, decreased activity tolerance, decreased mobility, difficulty walking, decreased ROM, decreased strength, increased edema, and pain.   ACTIVITY LIMITATIONS: carrying, lifting, bending, squatting, stairs, and locomotion  level  PARTICIPATION LIMITATIONS: meal prep, cleaning, laundry, shopping,  community activity, and yard work  PERSONAL FACTORS: Age, Behavior pattern, Education, Past/current experiences, and 1-2 comorbidities: DM, HTN, peripheral edema,  are also affecting patient's functional outcome.   REHAB POTENTIAL: Fair    CLINICAL DECISION MAKING: Evolving/moderate complexity  EVALUATION COMPLEXITY: Moderate   GOALS: Goals reviewed with patient? Yes  SHORT TERM GOALS: Target date: 2 weeks 11/14/23 I HEP Baseline: Goal status: MET   LONG TERM GOALS: Target date: 12/26/23  Tegner Lysholm scale improve from 34/100 disability to 24/100 disability regarding L knee Baseline:  Goal status: INITIAL  2.  Able to complete 10 L SLR maintaining no quad  lag for demonstration of better distal quads control and safety with gait Baseline:  Goal status: INITIAL  3.  Long arc quad L improve from 10 degree lag to no lag for demonstration of quads control with walking Baseline:  Goal status: progressing 11/22/23: 0 degree lag noted today  4.  30 sec sit to stand improve from 8 to 12 Baseline:  Goal status: INITIAL   PLAN:  PT FREQUENCY: 2x/week  PT DURATION: 8 weeks  PLANNED INTERVENTIONS: 97110-Therapeutic exercises, 97530- Therapeutic activity, 97112- Neuromuscular re-education, 97535- Self Care, and 82956- Manual therapy  PLAN FOR NEXT SESSION:NMES for muscle re ed L quads, reinforce and instruct further in home ex program; progress strengthening; try to increase WB through knees    Dovey Fatzinger L Emmalena Canny, PT, DPT, OCS 11/29/2023, 9:28 AM

## 2023-12-04 ENCOUNTER — Ambulatory Visit: Payer: 59 | Admitting: Internal Medicine

## 2023-12-13 ENCOUNTER — Other Ambulatory Visit: Payer: Self-pay

## 2023-12-13 ENCOUNTER — Ambulatory Visit: Payer: 59 | Attending: Family Medicine

## 2023-12-13 DIAGNOSIS — R262 Difficulty in walking, not elsewhere classified: Secondary | ICD-10-CM | POA: Diagnosis not present

## 2023-12-13 DIAGNOSIS — M25562 Pain in left knee: Secondary | ICD-10-CM | POA: Diagnosis not present

## 2023-12-13 DIAGNOSIS — G8929 Other chronic pain: Secondary | ICD-10-CM | POA: Diagnosis not present

## 2023-12-13 DIAGNOSIS — M25662 Stiffness of left knee, not elsewhere classified: Secondary | ICD-10-CM | POA: Insufficient documentation

## 2023-12-13 DIAGNOSIS — M6281 Muscle weakness (generalized): Secondary | ICD-10-CM | POA: Insufficient documentation

## 2023-12-13 NOTE — Therapy (Signed)
 OUTPATIENT PHYSICAL THERAPY TREATMENT/ DC SUMMARY  Progress Note Reporting Period 10/30/23 to 12/13/23  See note below for Objective Data and Assessment of Progress/Goals.     Patient Name: Andrea Anthony MRN: [id number] DOB:09/09/1946, 78 y.o., female Today's Date: 12/13/2023  END OF SESSION:  PT End of Session - 12/13/23 0809     Visit Number 8    Date for PT Re-Evaluation 12/26/23    Progress Note Due on Visit 10    PT Start Time 0806    PT Stop Time 0845    PT Time Calculation (min) 39 min    Activity Tolerance Patient tolerated treatment well    Behavior During Therapy [facility] for tasks assessed/performed               Past Medical History:  Diagnosis Date   ALLERGIC RHINITIS 01/07/2008   Qualifier: Diagnosis of  By: Norleen MD, Lynwood ORN    [facility] DEFICIENCY 01/07/2008   Qualifier: Diagnosis of  By: Norleen MD, Lynwood ORN    DIABETES MELLITUS, TYPE II 06/24/2007   Qualifier: Diagnosis of  By: Norleen MD, Lynwood ORN    DIVERTICULOSIS, COLON 01/07/2008   Qualifier: Diagnosis of  By: Norleen MD, Lynwood ORN    Empty sella (HCC) 03/17/2013   GERD 05/24/2009   Qualifier: Diagnosis of  By: Norleen MD, Lynwood ORN    HYPERLIPIDEMIA 06/01/2008   Qualifier: Diagnosis of  By: Norleen MD, Lynwood ORN    HYPERTENSION 06/24/2007   Qualifier: Diagnosis of  By: Norleen MD, Lynwood ORN    Lumbar disc disease 10/08/2012   l4-5 per CT 2008   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   Gastric, 2001   PULMONARY NODULE, RIGHT LOWER LOBE 01/09/2008   Qualifier: Diagnosis of  By: Norleen MD, Lynwood ORN    Umbilical hernia 10/08/2012   Fat containing on CT 2008   Past Surgical History:  Procedure Laterality Date   NO PAST SURGERIES     UPPER GASTROINTESTINAL ENDOSCOPY     Patient Active Problem List   Diagnosis Date Noted   Glaucoma 10/24/2023   Nuclear sclerotic cataract of right eye 01/31/2023   Right leg pain 05/20/2022   Nocturnal leg cramps 05/20/2022   Mastoiditis 05/08/2021   Pain and swelling of left lower leg 07/09/2020   Left  knee pain 04/24/2020   External otitis of right ear 11/19/2019   CAP (community acquired pneumonia) 11/20/2018   Acute bronchitis 11/05/2018   Weight loss 06/05/2018   Spinal stenosis of lumbar region with neurogenic claudication 03/21/2017   Left lumbar radiculopathy 01/30/2017   Right cervical radiculopathy 05/14/2014   Neck pain on right side 09/16/2013   Empty sella (HCC) 03/17/2013   Headache 03/10/2013   Blurred vision, bilateral 03/10/2013   Umbilical hernia 10/08/2012   Lumbar disc disease 10/08/2012   Constipation 10/08/2012   Left shoulder pain 08/05/2012   Encounter for well adult exam with abnormal findings 06/08/2012   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   Backache 04/11/2010   PERIPHERAL EDEMA 04/11/2010   EPISTAXIS, RECURRENT 04/11/2010   GERD 05/24/2009   Menopausal and postmenopausal disorder 05/24/2009   FATIGUE 11/12/2008   COMPUTERIZED TOMOGRAPHY, CHEST, ABNORMAL 11/12/2008   Hyperlipidemia 06/01/2008   PULMONARY NODULE, RIGHT LOWER LOBE 01/09/2008   Iron deficiency anemia 01/07/2008   Allergic rhinitis 01/07/2008   Diverticulosis of colon 01/07/2008   CHEST PAIN 01/07/2008   Type II diabetes mellitus with manifestations (HCC) 06/24/2007   Essential hypertension 06/24/2007    PCP: Norleen Lynwood, MD  REFERRING  PROVIDER: Arvell Lye, DO  REFERRING DIAG: L knee osteoarthritis  THERAPY DIAG:  Difficulty in walking, not elsewhere classified  Weakness of left quadriceps muscle  Stiffness of left knee, not elsewhere classified  Chronic pain of left knee  Rationale for Evaluation and Treatment: Rehabilitation  ONSET DATE: chronic  SUBJECTIVE:   SUBJECTIVE STATEMENT: Not much pain, walking well, doing exercises in the house  PERTINENT HISTORY: Saw MD with c/o L knee pain, her referred her for outpt PT PAIN:  Are you having pain? Yes: NPRS scale: pt unable to give number/10 Pain location: L lateral, posterior knee, calf Pain description: just  hurts Aggravating factors: walking Relieving factors: started taking mobic  which helped  PRECAUTIONS: None  RED FLAGS: None   WEIGHT BEARING RESTRICTIONS: No  FALLS:  Has patient fallen in last 6 months? No  LIVING ENVIRONMENT: Lives with: lives with their spouse Lives in: House/apartment Stairs: 3 steps outside with rail Has following equipment at home: None  OCCUPATION: retired  PLOF: Independent  PATIENT GOALS: get rid of L knee pain, be able to walk better  NEXT MD VISIT: May 2025 with PCP  OBJECTIVE:  Note: Objective measures were completed at Evaluation unless otherwise noted.  DIAGNOSTIC FINDINGS: CLINICAL DATA:  Left knee pain and edema   EXAM: LEFT KNEE 3 VIEWS   COMPARISON:  None Available.   FINDINGS: No fracture or dislocation. Preserved joint spaces and bone mineralization. Small osteophytes seen of all 3 compartments. Hyperostosis. Question small joint body.   IMPRESSION: Mild degenerative changes.  Question small joint body.  No effusion.     Electronically Signed   By: Ranell Bring M.D.   On: 10/02/2023 10:52  PATIENT SURVEYS:  Tegner Lysholm scale 34/100 disability  COGNITION: Overall cognitive status: Within functional limits for tasks assessed     SENSATION: WFL  EDEMA:  Palpable, visible edema L medial knee jt line  MUSCLE LENGTH: NT  POSTURE:  varum B knees, L knee maintains slightly flexed , scooped out, atrophied L distal quadriceps muscle  PALPATION: Decreased L patellar mobility mobility in all directions as compared to R Reports pain with palpation lateral L knee over IT band insertion  LOWER EXTREMITY ROM:  Active ROM Right eval Left eval 12/13/23  Hip flexion     Hip extension     Hip abduction     Hip adduction     Hip internal rotation     Hip external rotation     Knee flexion 116 100 p! 116  Knee extension  -10 -5 in supine  Ankle dorsiflexion  -8   Ankle plantarflexion     Ankle inversion     Ankle  eversion      (Blank rows = not tested)  LOWER EXTREMITY MMT: assessed B quads, long arc quad L with 15 degree lag SLR L with 10 degree lag  Did not assess hamstrings, plantarflexors due to time constraints and language barriers 11/14/23- L hamstrings- 4-/5, PF- 4/5 with manual resistance (6 single leg heel raises) 12/13/23: MMT for L quads 5/5, hamstrings 5/5 Long arc quad with 10 degree lag  LOWER EXTREMITY SPECIAL TESTS:  NA  FUNCTIONAL TESTS:  30 seconds chair stand test 8  GAIT: Distance walked: in clinic 11' no device Assistive device utilized: None Level of assistance: Complete Independence Comments: wide base of support and increased lateral trunk side bending with walking, some antalgia L,    TODAY'S TREATMENT:  DATE: 12/13/23: Supine SLR L LE , no observable quads lag today, 10 x Supine for manual stretching for L hamstrings, with overpressure by PT, sustained holds 30 to 45 sec  Recumbent cycle level 2  x6 min.   11/29/23: Recumbent cycle 6 min L3 Supine for AP tibial glides medially with tibial adduction force, for extension ROM, 3 bouts, 7 reps each SLR L 10 x Standing B heel drops with forefeet on 2 book, and B heel raises 10 reps Lateral R heel taps, from 4 step, 2 x 10 Seated long arc quads 5# 10 x 3 Seated L hamstring curls, red band, 10 x 3 Prone for deep tissue, cross friction massage, L plantarflexors and L hamstrings  Supine for manual stretching for L hamstrings, with overpressure by PT, sustained holds 30 to 45 sec   11/26/23 Nustep L6x7min UE/LE Mini squats 2x10- cues to avoid knees over toes Standing hip abduction x 10 B 4' step downs with RLE 2x10 Supine L SLR x 10 - tactile and verb cues to avoid quad lag Manually resisted L SAQ x 10  Post knee capsule stretch. AP mobs for knee grade II-III  11/22/23: Nustep 6 min L5  UE/ LE NMES 4 pads L quads for 8 min, with LAQ's 10 sec hold, 30 sec off Inst again in quad sets with towel roll under L knee SLR 10 reps frequent cues to avoid lag Also forward step ups onto 4 step 10 x with B UE support  11/20/23 Nustep L6x50min UE/LE NMES to L quads 10 sec on/50 sec off x 12 min  Mini squats at counter x 10  Knee extension 15# x 10 BLE  11/16/23 Nustep L5x86min Seated hip ADD ball squeeze 10x3 Seated L HS curls YTB x 10  Seated L LAQ 2# x 10  Seated marching 2# x 10  Standing heel raises x 10  Patellar medial glides grade IV mobs Tibiofemoral joint mobs AP grade I-II for pain Passive hamstring stretching 2 reps   11/14/23 Nustep L3x23min  Seated LAQ x 15 Seated L QS x 15 Seated L PF with blue TB Assessment of PF and HS strength NMES to L quads 10 sec on/50 sec off x  --pt performing LAQ with each contraction cycle  10/31/23:  Evaluation, instructed pt and daughter in 2 exercises:    Quad sets with towel roll under knee Long arc quads, using theraband to assist with terminal extension 3. Utilized kinesiotaping to lift and support L patella, 2 I pieces extending from tibial tubercle to upper quads PATIENT EDUCATION:  Education details: POC, goals Person educated: Patient and Child(ren) Education method: Explanation, Demonstration, Verbal cues, and Handouts Education comprehension: verbalized understanding, tactile cues required, and needs further education  HOME EXERCISE PROGRAM: Access Code: [email or web address] URL: [email or web address] Date: 11/22/2023 Prepared by: Greig Credit  Exercises - Seated Long Arc Quad  - 1 x daily - 3 x weekly - 3 sets - 10 reps - Supine Quad Set  - 1 x daily - 3 x weekly - 3 sets - 10 reps - Small Range Straight Leg Raise  - 1 x daily - 3 x weekly - 3 sets - 10 reps See above  ASSESSMENT:  CLINICAL IMPRESSION: The patient reports overall improved pain L knee.  She returns today after 2 week gap in ex.  She  is functioning well, very minimal pain L knee with higher intensity activities. No antalgia any more.  Ready for DC.  She agreed with DC  plan.  OBJECTIVE IMPAIRMENTS: Abnormal gait, decreased activity tolerance, decreased mobility, difficulty walking, decreased ROM, decreased strength, increased edema, and pain.   ACTIVITY LIMITATIONS: carrying, lifting, bending, squatting, stairs, and locomotion level  PARTICIPATION LIMITATIONS: meal prep, cleaning, laundry, shopping, community activity, and yard work  PERSONAL FACTORS: Age, Behavior pattern, Education, Past/current experiences, and 1-2 comorbidities: DM, HTN, peripheral edema,  are also affecting patient's functional outcome.   REHAB POTENTIAL: Fair    CLINICAL DECISION MAKING: Evolving/moderate complexity  EVALUATION COMPLEXITY: Moderate   GOALS: Goals reviewed with patient? Yes  SHORT TERM GOALS: Target date: 2 weeks 11/14/23 I HEP Baseline: Goal status: MET   LONG TERM GOALS: Target date: 12/26/23  Tegner Lysholm scale improve from 34/100 disability to 24/100 disability regarding L knee Baseline:  Goal status: 12/13/23 met, IMPROVED significantly Tegner Lysholm Knee Score: 87 / 100 = 87.0 %  2.  Able to complete 10 L SLR maintaining no quad  lag for demonstration of better distal quads control and safety with gait Baseline:  Goal status: 12/13/23: MET   3.  Long arc quad L improve from 10 degree lag to no lag for demonstration of quads control with walking Baseline:  Goal status: progressing 11/22/23: 0 degree lag noted today 12/13/23: met  4.  30 sec sit to stand improve from 8 to 12 Baseline:  Goal status: 12/13/23: 8 reps today, hands on thighs vs with B UE support initially.  Not met, but pt paused with each stand for several seconds.   PLAN:  PT FREQUENCY: 2x/week  PT DURATION: 8 weeks  PLANNED INTERVENTIONS: 97110-Therapeutic exercises, 97530- Therapeutic activity, [id number]- Neuromuscular re-education, 97535- Self  Care, and [id number]- Manual therapy  PLAN FOR NEXT SESSION:Function much improved, able to walk without antalgia now. I with HEP, DC  Baylee Mccorkel L Yuridia Couts, PT, DPT, OCS 12/13/2023, 10:53 AM

## 2024-04-23 ENCOUNTER — Ambulatory Visit: Payer: 59 | Admitting: Internal Medicine

## 2024-05-08 ENCOUNTER — Encounter: Payer: Self-pay | Admitting: Internal Medicine

## 2024-05-08 ENCOUNTER — Other Ambulatory Visit: Payer: Self-pay | Admitting: Internal Medicine

## 2024-05-08 ENCOUNTER — Ambulatory Visit: Admitting: Internal Medicine

## 2024-05-08 ENCOUNTER — Ambulatory Visit: Payer: Self-pay | Admitting: Internal Medicine

## 2024-05-08 VITALS — BP 126/74 | HR 65 | Temp 98.1°F | Ht 72.0 in | Wt 184.0 lb

## 2024-05-08 DIAGNOSIS — Z7984 Long term (current) use of oral hypoglycemic drugs: Secondary | ICD-10-CM

## 2024-05-08 DIAGNOSIS — E118 Type 2 diabetes mellitus with unspecified complications: Secondary | ICD-10-CM | POA: Diagnosis not present

## 2024-05-08 DIAGNOSIS — E538 Deficiency of other specified B group vitamins: Secondary | ICD-10-CM | POA: Diagnosis not present

## 2024-05-08 DIAGNOSIS — Z0000 Encounter for general adult medical examination without abnormal findings: Secondary | ICD-10-CM

## 2024-05-08 DIAGNOSIS — E559 Vitamin D deficiency, unspecified: Secondary | ICD-10-CM

## 2024-05-08 DIAGNOSIS — I7121 Aneurysm of the ascending aorta, without rupture: Secondary | ICD-10-CM

## 2024-05-08 DIAGNOSIS — M25562 Pain in left knee: Secondary | ICD-10-CM

## 2024-05-08 DIAGNOSIS — R931 Abnormal findings on diagnostic imaging of heart and coronary circulation: Secondary | ICD-10-CM | POA: Diagnosis not present

## 2024-05-08 DIAGNOSIS — D508 Other iron deficiency anemias: Secondary | ICD-10-CM

## 2024-05-08 DIAGNOSIS — E7800 Pure hypercholesterolemia, unspecified: Secondary | ICD-10-CM | POA: Diagnosis not present

## 2024-05-08 DIAGNOSIS — Z0001 Encounter for general adult medical examination with abnormal findings: Secondary | ICD-10-CM

## 2024-05-08 LAB — LIPID PANEL
Cholesterol: 146 mg/dL (ref 0–200)
HDL: 52.4 mg/dL (ref >39.00)
LDL Cholesterol: 67 mg/dL (ref 0–99)
NonHDL: 93.6
Total CHOL/HDL Ratio: 3
Triglycerides: 135 mg/dL (ref 0.0–149.0)
VLDL: 27 mg/dL (ref 0.0–40.0)

## 2024-05-08 LAB — CBC WITH DIFFERENTIAL/PLATELET
Basophils Absolute: 0 10*3/uL (ref 0.0–0.1)
Basophils Relative: 0.2 % (ref 0.0–3.0)
Eosinophils Absolute: 0.1 10*3/uL (ref 0.0–0.7)
Eosinophils Relative: 1.6 % (ref 0.0–5.0)
HCT: 45.1 % (ref 36.0–46.0)
Hemoglobin: 14.7 g/dL (ref 12.0–15.0)
Lymphocytes Relative: 34.4 % (ref 12.0–46.0)
Lymphs Abs: 2.7 10*3/uL (ref 0.7–4.0)
MCHC: 32.5 g/dL (ref 30.0–36.0)
MCV: 87.3 fl (ref 78.0–100.0)
Monocytes Absolute: 0.5 10*3/uL (ref 0.1–1.0)
Monocytes Relative: 6.4 % (ref 3.0–12.0)
Neutro Abs: 4.5 10*3/uL (ref 1.4–7.7)
Neutrophils Relative %: 57.4 % (ref 43.0–77.0)
Platelets: 227 10*3/uL (ref 150.0–400.0)
RBC: 5.17 Mil/uL — ABNORMAL HIGH (ref 3.87–5.11)
RDW: 13.4 % (ref 11.5–15.5)
WBC: 7.8 10*3/uL (ref 4.0–10.5)

## 2024-05-08 LAB — URINALYSIS, ROUTINE W REFLEX MICROSCOPIC
Bilirubin Urine: NEGATIVE
Hgb urine dipstick: NEGATIVE
Ketones, ur: NEGATIVE
Leukocytes,Ua: NEGATIVE
Nitrite: NEGATIVE
Specific Gravity, Urine: <=1.005 — AB (ref 1.000–1.030)
Total Protein, Urine: NEGATIVE
Urine Glucose: >=1000 — AB
Urobilinogen, UA: 0.2 (ref 0.0–1.0)
pH: 6 (ref 5.0–8.0)

## 2024-05-08 LAB — BASIC METABOLIC PANEL WITH GFR
BUN: 23 mg/dL (ref 6–23)
CO2: 28 meq/L (ref 19–32)
Calcium: 10.8 mg/dL — ABNORMAL HIGH (ref 8.4–10.5)
Chloride: 102 meq/L (ref 96–112)
Creatinine, Ser: 1.2 mg/dL (ref 0.40–1.20)
GFR: 43.83 mL/min — ABNORMAL LOW (ref >60.00)
Glucose, Bld: 213 mg/dL — ABNORMAL HIGH (ref 70–99)
Potassium: 4.5 meq/L (ref 3.5–5.1)
Sodium: 139 meq/L (ref 135–145)

## 2024-05-08 LAB — HEPATIC FUNCTION PANEL
ALT: 25 U/L (ref 0–35)
AST: 19 U/L (ref 0–37)
Albumin: 4.4 g/dL (ref 3.5–5.2)
Alkaline Phosphatase: 98 U/L (ref 39–117)
Bilirubin, Direct: 0.1 mg/dL (ref 0.0–0.3)
Total Bilirubin: 0.6 mg/dL (ref 0.2–1.2)
Total Protein: 7.5 g/dL (ref 6.0–8.3)

## 2024-05-08 LAB — IBC PANEL
Iron: 72 ug/dL (ref 42–145)
Saturation Ratios: 18 % — ABNORMAL LOW (ref 20.0–50.0)
TIBC: 399 ug/dL (ref 250.0–450.0)
Transferrin: 285 mg/dL (ref 212.0–360.0)

## 2024-05-08 LAB — MICROALBUMIN / CREATININE URINE RATIO
Creatinine,U: 36.5 mg/dL
Microalb Creat Ratio: UNDETERMINED mg/g (ref 0.0–30.0)
Microalb, Ur: <0.7 mg/dL

## 2024-05-08 LAB — HEMOGLOBIN A1C: Hgb A1c MFr Bld: 8.7 % — ABNORMAL HIGH (ref 4.6–6.5)

## 2024-05-08 LAB — VITAMIN B12: Vitamin B-12: 828 pg/mL (ref 211–911)

## 2024-05-08 LAB — FERRITIN: Ferritin: 22.2 ng/mL (ref 10.0–291.0)

## 2024-05-08 LAB — TSH: TSH: 1.5 u[IU]/mL (ref 0.35–5.50)

## 2024-05-08 LAB — VITAMIN D 25 HYDROXY (VIT D DEFICIENCY, FRACTURES): VITD: 35.88 ng/mL (ref 30.00–100.00)

## 2024-05-08 MED ORDER — REPAGLINIDE 1 MG PO TABS: 1.0000 mg | ORAL_TABLET | Freq: Three times a day (TID) | ORAL | 3 refills | Status: DC

## 2024-05-08 NOTE — Patient Instructions (Addendum)
 Please have your Shingrix (shingles) shots done at your local pharmacy.  Please continue all other medications as before, and refills have been done if requested.  Please have the pharmacy call with any other refills you may need.  Please continue your efforts at being more active, low cholesterol diet, and weight control.  You are otherwise up to date with prevention measures today.  Please keep your appointments with your specialists as you may have planned  Please go to the LAB at the blood drawing area for the tests to be done  You will be contacted by phone if any changes need to be made immediately.  Otherwise, you will receive a letter about your results with an explanation, but please check with MyChart first.  Please make an Appointment to return in 6 months, or sooner if needed

## 2024-05-08 NOTE — Progress Notes (Signed)
 Patient ID: Andrea Anthony, female   DOB: 04/04/1946, 78 y.o.   MRN: [id number]         Chief Complaint:: wellness exam and Medical Management of Chronic Issues (6 month follow up, pain in her hands and having pain all over )  , thoracic aortic aneurysm, elevated Card CT score, dm, hld, iron def anemia       HPI:  Andrea Anthony is a 78 y.o. female here for wellness exam; declines colonoscopy, for shingrix at pharmacy, o/w up to date                        Also has pain complaints ongoing, today to left upper arm soreness, right lower leg soreness to walk and touch, and left knee DJD for which she still declines ortho eval or cortisone.  Remains active with gym daily at 90 min where she enjoys the machines most of the time.  Pt denies chest pain, increased sob or doe, wheezing, orthopnea, PND, increased LE swelling, palpitations, dizziness or syncope.   Pt denies polydipsia, polyuria, or new focal neuro s/s.   No overt bleeding   Wt Readings from Last 3 Encounters:  05/08/24 184 lb (83.5 kg)  10/24/23 190 lb (86.2 kg)  10/10/23 194 lb (88 kg)   BP Readings from Last 3 Encounters:  05/08/24 126/74  10/24/23 124/74  10/10/23 112/80   Immunization History  Administered Date(s) Administered   Fluad Quad(high Dose 65+) 09/12/2019, 10/26/2020, 11/16/2021   Fluad Trivalent(High Dose 65+) 09/25/2023   H1N1 11/12/2008   Influenza Split 10/18/2010   Influenza Whole 01/07/2008   Influenza, High Dose Seasonal PF 11/09/2015, 10/03/2016, 10/23/2017, 11/20/2018   Influenza,inj,Quad PF,6+ Mos 09/16/2013   Pneumococcal Conjugate-13 09/16/2013   Pneumococcal Polysaccharide-23 05/24/2009, 06/05/2018   Td 05/24/2009   Tdap 09/12/2019   Zoster, Live 10/03/2016   Health Maintenance Due  Topic Date Due   Zoster Vaccines- Shingrix (1 of 2) 05/23/1966   Medicare Annual Wellness (AWV)  05/29/2024      Past Medical History:  Diagnosis Date   ALLERGIC RHINITIS 01/07/2008   Qualifier: Diagnosis  of  By: Autry Legions MD, Alveda Aures    [facility] DEFICIENCY 01/07/2008   Qualifier: Diagnosis of  By: Autry Legions MD, Alveda Aures    DIABETES MELLITUS, TYPE II 06/24/2007   Qualifier: Diagnosis of  By: Autry Legions MD, Alveda Aures    DIVERTICULOSIS, COLON 01/07/2008   Qualifier: Diagnosis of  By: Autry Legions MD, Alveda Aures    Empty sella (HCC) 03/17/2013   GERD 05/24/2009   Qualifier: Diagnosis of  By: Autry Legions MD, Alveda Aures    HYPERLIPIDEMIA 06/01/2008   Qualifier: Diagnosis of  By: Autry Legions MD, Alveda Aures    HYPERTENSION 06/24/2007   Qualifier: Diagnosis of  By: Autry Legions MD, Alveda Aures    Lumbar disc disease 10/08/2012   l4-5 per CT 2008   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 She has never used smokeless tobacco. She reports that she does not drink alcohol and does not use drugs. family history includes Hypertension in her father. Allergies  Allergen Reactions   Ace  Inhibitors     REACTION: cough   Sulfonamide Derivatives     Not sure of reaction   Current Outpatient Medications on File Prior to Visit  Medication Sig Dispense Refill   amLODipine  (NORVASC ) 5 MG tablet 1 tab by mouth once daily 90 tablet 3   aspirin 81 MG tablet Take 81 mg by mouth daily.     blood glucose meter kit and supplies KIT Dispense based on patient and insurance preference. Use up to four times daily as directed E11.9 1 each 0   dapagliflozin  propanediol (FARXIGA ) 10 MG TABS tablet Take 1 tablet (10 mg total) by mouth daily before breakfast. 90 tablet 3   gabapentin  (NEURONTIN ) 100 MG capsule Take 1 capsule (100 mg total) by mouth 3 (three) times daily. 270 capsule 1   irbesartan  (AVAPRO ) 300 MG tablet Take 1 tablet (300 mg total) by mouth daily. 90 tablet 3    metFORMIN  (GLUCOPHAGE -XR) 500 MG 24 hr tablet Take 4 tablets (2,000 mg total) by mouth daily with breakfast. 360 tablet 3   nebivolol  (BYSTOLIC ) 5 MG tablet Take 1 tablet (5 mg total) by mouth daily. 90 tablet 3   OneTouch Delica Lancets 33G MISC Use to help check blood sugars twice a day Dx E11.9 200 each 11   pioglitazone  (ACTOS ) 45 MG tablet Take 1 tablet (45 mg total) by mouth daily. 90 tablet 3   pravastatin  (PRAVACHOL ) 40 MG tablet Take 1 tablet (40 mg total) by mouth daily. 90 tablet 3   tiZANidine  (ZANAFLEX ) 2 MG tablet 1 tab by mouth twice per day as needed for cramps 60 tablet 2   traMADol  (ULTRAM ) 50 MG tablet Take 1 tablet (50 mg total) by mouth every 8 (eight) hours as needed. 90 tablet 5   No current facility-administered medications on file prior to visit.        ROS:  All others reviewed and negative.  Objective        PE:  BP 126/74 (BP Location: Right Arm, Patient Position: Sitting, Cuff Size: Normal)   Pulse 65   Temp 98.1 F (36.7 C) (Oral)   Ht 6' (1.829 m)   Wt 184 lb (83.5 kg)   SpO2 98%   BMI 24.95 kg/m                 Constitutional: Pt appears in NAD               HENT: Head: NCAT.                Right Ear: External ear normal.                 Left Ear: External ear normal.                Eyes: . Pupils are equal, round, and reactive to light. Conjunctivae and EOM are normal               Nose: without d/c or deformity               Neck: Neck supple. Gross normal ROM               Cardiovascular: Normal rate and regular rhythm.                 Pulmonary/Chest: Effort normal and breath sounds without rales or wheezing.                Abd:  Soft, NT, ND, +  BS, no organomegaly               Neurological: Pt is alert. At baseline orientation, motor grossly intact               Skin: Skin is warm. No rashes, no other new lesions, LE edema - none               Psychiatric: Pt behavior is normal without agitation   Micro: none  Cardiac tracings I have  personally interpreted today:  none  Pertinent Radiological findings (summarize): none   Lab Results  Component Value Date   WBC 7.8 05/08/2024   HGB 14.7 05/08/2024   HCT 45.1 05/08/2024   PLT 227.0 05/08/2024   GLUCOSE 213 (H) 05/08/2024   CHOL 146 05/08/2024   TRIG 135.0 05/08/2024   HDL 52.40 05/08/2024   LDLCALC 67 05/08/2024   ALT 25 05/08/2024   AST 19 05/08/2024   NA 139 05/08/2024   K 4.5 05/08/2024   CL 102 05/08/2024   CREATININE 1.20 05/08/2024   BUN 23 05/08/2024   CO2 28 05/08/2024   TSH 1.50 05/08/2024   HGBA1C 8.7 (H) 05/08/2024   MICROALBUR <0.7 05/08/2024   Assessment/Plan:  Marcina Calderone is a 78 y.o. Black or African American [2] female with  has a past medical history of ALLERGIC RHINITIS (01/07/2008), ANEMIA-IRON DEFICIENCY (01/07/2008), DIABETES MELLITUS, TYPE II (06/24/2007), DIVERTICULOSIS, COLON (01/07/2008), Empty sella (HCC) (03/17/2013), GERD (05/24/2009), HYPERLIPIDEMIA (06/01/2008), HYPERTENSION (06/24/2007), Lumbar disc disease (10/08/2012), MALT lymphoma (06/08/2012), PULMONARY NODULE, RIGHT LOWER LOBE (01/09/2008), and Umbilical hernia (10/08/2012).  Encounter for well adult exam with abnormal findings Age and sex appropriate education and counseling updated with regular exercise and diet Referrals for preventative services - declines colonoscopy Immunizations addressed -  Smoking counseling  - none needed Evidence for depression or other mood disorder - none significant Most recent labs reviewed. I have personally reviewed and have noted: 1) the patient's medical and social history 2) The patient's current medications and supplements 3) The patient's height, weight, and BMI have been recorded in the chart   Type II diabetes mellitus with manifestations (HCC) Lab Results  Component Value Date   HGBA1C 8.7 (H) 05/08/2024   uncontrolled, I suspect possible med non compliance? pt to continue current medical treatment farxiga  10 every day,  metformin  ER 500 mg - 4 every day, actos  45 mg every day and add prandin  1 mg tid   Hyperlipidemia Lab Results  Component Value Date   LDLCALC 67 05/08/2024   Stable, pt to continue current statin pravachol  40 mg qd   Iron deficiency anemia Also for f/u iron lab today  Thoracic aortic aneurysm (HCC) Noted on CT x 6 mo ago; will plan to recheck at next visit  Left knee pain C/w djd,, needs ortho for cortisone but declines  Elevated coronary artery calcium score Lab Results  Component Value Date   CHOL 146 05/08/2024   HDL 52.40 05/08/2024   LDLCALC 67 05/08/2024   TRIG 135.0 05/08/2024   CHOLHDL 3 05/08/2024   Cont pravachol  40 mg qd  Followup: Return in about 6 months (around 11/08/2024).  Rosalia Colonel, MD 05/10/2024 10:46 AM [facility]

## 2024-05-10 ENCOUNTER — Encounter: Payer: Self-pay | Admitting: Internal Medicine

## 2024-05-10 DIAGNOSIS — I712 Thoracic aortic aneurysm, without rupture, unspecified: Secondary | ICD-10-CM | POA: Insufficient documentation

## 2024-05-10 DIAGNOSIS — R931 Abnormal findings on diagnostic imaging of heart and coronary circulation: Secondary | ICD-10-CM | POA: Insufficient documentation

## 2024-05-10 NOTE — Assessment & Plan Note (Signed)
 Lab Results  Component Value Date   LDLCALC 67 05/08/2024   Stable, pt to continue current statin pravachol  40 mg qd

## 2024-05-10 NOTE — Assessment & Plan Note (Signed)
 Lab Results  Component Value Date   CHOL 146 05/08/2024   HDL 52.40 05/08/2024   LDLCALC 67 05/08/2024   TRIG 135.0 05/08/2024   CHOLHDL 3 05/08/2024   Cont pravachol  40 mg qd

## 2024-05-10 NOTE — Assessment & Plan Note (Signed)
 C/w djd,, needs ortho for cortisone but declines

## 2024-05-10 NOTE — Assessment & Plan Note (Signed)
Also for f/u iron lab today

## 2024-05-10 NOTE — Assessment & Plan Note (Signed)
 Lab Results  Component Value Date   HGBA1C 8.7 (H) 05/08/2024   uncontrolled, I suspect possible med non compliance? pt to continue current medical treatment farxiga  10 every day, metformin  ER 500 mg - 4 every day, actos  45 mg every day and add prandin  1 mg tid

## 2024-05-10 NOTE — Assessment & Plan Note (Signed)
 Noted on CT x 6 mo ago; will plan to recheck at next visit

## 2024-05-10 NOTE — Assessment & Plan Note (Signed)
 Age and sex appropriate education and counseling updated with regular exercise and diet Referrals for preventative services - declines colonoscopy Immunizations addressed -  Smoking counseling  - none needed Evidence for depression or other mood disorder - none significant Most recent labs reviewed. I have personally reviewed and have noted: 1) the patient's medical and social history 2) The patient's current medications and supplements 3) The patient's height, weight, and BMI have been recorded in the chart

## 2024-05-14 DIAGNOSIS — H26492 Other secondary cataract, left eye: Secondary | ICD-10-CM | POA: Diagnosis not present

## 2024-05-14 DIAGNOSIS — H52203 Unspecified astigmatism, bilateral: Secondary | ICD-10-CM | POA: Diagnosis not present

## 2024-05-14 DIAGNOSIS — H524 Presbyopia: Secondary | ICD-10-CM | POA: Diagnosis not present

## 2024-05-14 DIAGNOSIS — H04123 Dry eye syndrome of bilateral lacrimal glands: Secondary | ICD-10-CM | POA: Diagnosis not present

## 2024-05-14 DIAGNOSIS — Z961 Presence of intraocular lens: Secondary | ICD-10-CM | POA: Diagnosis not present

## 2024-05-14 DIAGNOSIS — Z7984 Long term (current) use of oral hypoglycemic drugs: Secondary | ICD-10-CM | POA: Diagnosis not present

## 2024-05-14 DIAGNOSIS — H5203 Hypermetropia, bilateral: Secondary | ICD-10-CM | POA: Diagnosis not present

## 2024-05-14 DIAGNOSIS — H43811 Vitreous degeneration, right eye: Secondary | ICD-10-CM | POA: Diagnosis not present

## 2024-05-14 DIAGNOSIS — E119 Type 2 diabetes mellitus without complications: Secondary | ICD-10-CM | POA: Diagnosis not present

## 2024-05-14 LAB — HM DIABETES EYE EXAM

## 2024-05-18 ENCOUNTER — Other Ambulatory Visit: Payer: Self-pay | Admitting: Internal Medicine

## 2024-05-19 ENCOUNTER — Other Ambulatory Visit: Payer: Self-pay

## 2024-06-05 ENCOUNTER — Ambulatory Visit: Payer: 59

## 2024-06-05 VITALS — Ht 72.0 in | Wt 184.0 lb

## 2024-06-05 DIAGNOSIS — Z0000 Encounter for general adult medical examination without abnormal findings: Secondary | ICD-10-CM

## 2024-06-05 DIAGNOSIS — Z780 Asymptomatic menopausal state: Secondary | ICD-10-CM

## 2024-06-05 NOTE — Patient Instructions (Signed)
 [name] , Thank you for taking time out of your busy schedule to complete your Annual Wellness Visit with me. I enjoyed our conversation and look forward to speaking with you again next year. I, as well as your care team,  appreciate your ongoing commitment to your health goals. Please review the following plan we discussed and let me know if I can assist you in the future. Your Game plan/ To Do List    Referrals: If you haven't heard from the office you've been referred to, please reach out to them at the phone provided.  You have an order for:  [x]   Bone Density     Please call for appointment:  [facility] [address] [place], [place] [address] [phone]  Make sure to wear two-piece clothing.  No lotions, powders, or deodorants the day of the appointment. Make sure to bring picture ID and insurance card.  Bring list of medications you are currently taking including any supplements.   Follow up Visits: Next Medicare AWV with our clinical staff: 06/08/2025.   Have you seen your provider in the last 6 months (3 months if uncontrolled diabetes)? Yes Next Office Visit with your provider: 11/12/2024.  Clinician Recommendations:  Aim for 30 minutes of exercise or brisk walking, 6-8 glasses of water, and 5 servings of fruits and vegetables each day. You are due for a shingles vaccine and can get that done at your local pharmacy.      This is a list of the screening recommended for you and due dates:  Health Maintenance  Topic Date Due   Zoster (Shingles) Vaccine (1 of 2) 05/23/1966   Colon Cancer Screening  05/08/2025*   Flu Shot  07/11/2024   Eye exam for diabetics  08/12/2024   Hemoglobin A1C  11/08/2024   Yearly kidney function blood test for diabetes  05/08/2025   Yearly kidney health urinalysis for diabetes  05/08/2025   Complete foot exam   05/08/2025   Medicare Annual Wellness Visit  06/05/2025   DTaP/Tdap/Td vaccine (3 - Td or Tdap)  09/11/2029   Pneumococcal Vaccine for age over 76  Completed   DEXA scan (bone density measurement)  Completed   Hepatitis C Screening  Completed   Hepatitis B Vaccine  Aged Out   HPV Vaccine  Aged Out   Meningitis B Vaccine  Aged Out   COVID-19 Vaccine  Discontinued  *Topic was postponed. The date shown is not the original due date.    Advanced directives: (Declined) Advance directive discussed with you today. Even though you declined this today, please call our office should you change your mind, and we can give you the proper paperwork for you to fill out. Advance Care Planning is important because it:  [x]  Makes sure you receive the medical care that is consistent with your values, goals, and preferences  [x]  It provides guidance to your family and loved ones and reduces their decisional burden about whether or not they are making the right decisions based on your wishes.  Follow the link provided in your after visit summary or read over the paperwork we have mailed to you to help you started getting your Advance Directives in place. If you need assistance in completing these, please reach out to us  so that we can help you!  See attachments for Preventive Care and Fall Prevention Tips.

## 2024-06-05 NOTE — Progress Notes (Signed)
 Subjective:   Andrea Anthony is a 78 y.o. who presents for a Medicare Wellness preventive visit.  As a reminder, Annual Wellness Visits don't include a physical exam, and some assessments may be limited, especially if this visit is performed virtually. We may recommend an in-person follow-up visit with your provider if needed.  Visit Complete: Virtual I connected with  Andrea Anthony on 06/05/24 by a audio enabled telemedicine application and verified that I am speaking with the correct person using two identifiers.  Patient Location: Home  Provider Location: Office/Clinic  I discussed the limitations of evaluation and management by telemedicine. The patient expressed understanding and agreed to proceed.  Vital Signs: Because this visit was a virtual/telehealth visit, some criteria may be missing or patient reported. Any vitals not documented were not able to be obtained and vitals that have been documented are patient reported.  VideoDeclined- This patient declined Librarian, academic. Therefore the visit was completed with audio only.  Persons Participating in Visit: Patient assisted by daughter.  AWV Questionnaire: No: Patient Medicare AWV questionnaire was not completed prior to this visit.  Cardiac Risk Factors include: advanced age (>34men, >68 women);diabetes mellitus;hypertension;dyslipidemia     Objective:    Today's Vitals   06/05/24 1152  Weight: 184 lb (83.5 kg)  Height: 6' (1.829 m)   Body mass index is 24.95 kg/m.     10/31/2023    8:52 AM 05/30/2023   11:43 AM 05/29/2022    3:08 PM 05/26/2021   11:20 AM 04/28/2020   10:30 AM  Advanced Directives  Does Patient Have a Medical Advance Directive? No No No No Yes  Type of Advance Directive     Living will;Healthcare Power of Attorney  Does patient want to make changes to medical advance directive?     No - Patient declined  Copy of Healthcare Power of Attorney in Chart?     No  - copy requested  Would patient like information on creating a medical advance directive? No - Patient declined No - Patient declined  No - Patient declined     Current Medications (verified) Outpatient Encounter Medications as of 06/05/2024  Medication Sig   amLODipine  (NORVASC ) 5 MG tablet 1 tab by mouth once daily   aspirin 81 MG tablet Take 81 mg by mouth daily.   blood glucose meter kit and supplies KIT Dispense based on patient and insurance preference. Use up to four times daily as directed E11.9   dapagliflozin  propanediol (FARXIGA ) 10 MG TABS tablet Take 1 tablet (10 mg total) by mouth daily before breakfast.   gabapentin  (NEURONTIN ) 100 MG capsule TAKE 1 CAPSULE BY MOUTH 3 TIMES A DAY   irbesartan  (AVAPRO ) 300 MG tablet Take 1 tablet (300 mg total) by mouth daily.   metFORMIN  (GLUCOPHAGE -XR) 500 MG 24 hr tablet Take 4 tablets (2,000 mg total) by mouth daily with breakfast.   nebivolol  (BYSTOLIC ) 5 MG tablet Take 1 tablet (5 mg total) by mouth daily.   OneTouch Delica Lancets 33G MISC Use to help check blood sugars twice a day Dx E11.9   pioglitazone  (ACTOS ) 45 MG tablet Take 1 tablet (45 mg total) by mouth daily.   pravastatin  (PRAVACHOL ) 40 MG tablet Take 1 tablet (40 mg total) by mouth daily.   repaglinide  (PRANDIN ) 1 MG tablet Take 1 tablet (1 mg total) by mouth 3 (three) times daily before meals.   tiZANidine  (ZANAFLEX ) 2 MG tablet 1 tab by mouth twice per day as needed for  cramps   traMADol  (ULTRAM ) 50 MG tablet Take 1 tablet (50 mg total) by mouth every 8 (eight) hours as needed.   No facility-administered encounter medications on file as of 06/05/2024.    Allergies (verified) Ace inhibitors and Sulfonamide derivatives   History: Past Medical History:  Diagnosis Date   ALLERGIC RHINITIS 01/07/2008   Qualifier: Diagnosis of  By: Norleen MD, Anthony ORN    [facility] DEFICIENCY 01/07/2008   Qualifier: Diagnosis of  By: Norleen MD, Anthony ORN    DIABETES MELLITUS, TYPE II 06/24/2007    Qualifier: Diagnosis of  By: Norleen MD, Anthony ORN    DIVERTICULOSIS, COLON 01/07/2008   Qualifier: Diagnosis of  By: Norleen MD, Anthony ORN    Empty sella (HCC) 03/17/2013   GERD 05/24/2009   Qualifier: Diagnosis of  By: Norleen MD, Anthony ORN    HYPERLIPIDEMIA 06/01/2008   Qualifier: Diagnosis of  By: Norleen MD, Anthony ORN    HYPERTENSION 06/24/2007   Qualifier: Diagnosis of  By: Norleen MD, Anthony ORN    Lumbar disc disease 10/08/2012   l4-5 per CT 2008   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   Gastric, 2001   PULMONARY NODULE, RIGHT LOWER LOBE 01/09/2008   Qualifier: Diagnosis of  By: Norleen MD, Anthony ORN    Umbilical hernia 10/08/2012   Fat containing on CT 2008   Past Surgical History:  Procedure Laterality Date   NO PAST SURGERIES     UPPER GASTROINTESTINAL ENDOSCOPY     Family History  Problem Relation Age of Onset   Hypertension Father    Colon cancer Neg Hx    Social History   Socioeconomic History   Marital status: Married    Spouse name: Not on file   Number of children: 13   Years of education: Not on file   Highest education level: Not on file  Occupational History   Occupation: RETIRED  Tobacco Use   Smoking status: Never   Smokeless tobacco: Never  Vaping Use   Vaping status: Never Used  Substance and Sexual Activity   Alcohol use: No   Drug use: No   Sexual activity: Not on file  Other Topics Concern   Not on file  Social History Narrative   Lives with husband/2025   7 children living   Social Drivers of Health   Financial Resource Strain: Low Risk  (06/05/2024)   Overall Financial Resource Strain (CARDIA)    Difficulty of Paying Living Expenses: Not hard at all  Food Insecurity: No Food Insecurity (06/05/2024)   Hunger Vital Sign    Worried About Running Out of Food in the Last Year: Never true    Ran Out of Food in the Last Year: Never true  Transportation Needs: No Transportation Needs (06/05/2024)   PRAPARE - Transportation    Lack of Transportation (Medical): No    Lack of  Transportation (Non-Medical): No  Physical Activity: Inactive (06/05/2024)   Exercise Vital Sign    Days of Exercise per Week: 0 days    Minutes of Exercise per Session: 0 min  Stress: No Stress Concern Present (06/05/2024)   [employer] of Occupational Health - Occupational Stress Questionnaire    Feeling of Stress: Not at all  Social Connections: Socially Integrated (06/05/2024)   Social Connection and Isolation Panel    Frequency of Communication with Friends and Family: More than three times a week    Frequency of Social Gatherings with Friends and Family: More than three times a week  Attends Religious Services: More than 4 times per year    Active Member of Clubs or Organizations: Yes    Attends Banker Meetings: Never    Marital Status: Married    Tobacco Counseling Counseling given: Not Answered    Clinical Intake:  Pre-visit preparation completed: Yes  Pain : No/denies pain     Diabetes: Yes CBG done?: No Did pt. bring in CBG monitor from home?: No  Lab Results  Component Value Date   HGBA1C 8.7 (H) 05/08/2024   HGBA1C 8.0 (H) 10/24/2023   HGBA1C 9.7 (H) 04/24/2023     How often do you need to have someone help you when you read instructions, pamphlets, or other written materials from your doctor or pharmacy?: 4 - Often     Comments: Daughter Information entered by :: Andrea Anthony, RMA   Activities of Daily Living     06/05/2024   11:40 AM  In your present state of health, do you have any difficulty performing the following activities:  Hearing? 0  Vision? 0  Difficulty concentrating or making decisions? 0  Walking or climbing stairs? 0  Dressing or bathing? 0  Doing errands, shopping? 0  Preparing Food and eating ? N  Using the Toilet? N  In the past six months, have you accidently leaked urine? N  Do you have problems with loss of bowel control? N  Managing your Medications? N  Managing your Finances? N  Housekeeping or  managing your Housekeeping? N    Patient Care Team: Andrea Anthony ORN, MD as PCP - General  I have updated your Care Teams any recent Medical Services you may have received from other providers in the past year.     Assessment:   This is a routine wellness examination for Andrea Anthony.  Hearing/Vision screen Hearing Screening - Comments:: Denies hearing difficulties   Vision Screening - Comments:: Wears eyeglasses/   Goals Addressed             This Visit's Progress    Client understands the importance of follow-up with providers by attending scheduled visits         Depression Screen     06/05/2024   11:46 AM 05/08/2024    8:20 AM 10/24/2023    8:44 AM 09/25/2023    3:41 PM 05/30/2023   11:36 AM 04/24/2023    8:23 AM 05/29/2022    3:09 PM  PHQ 2/9 Scores  PHQ - 2 Score 0 0 0 0 0 0 0  PHQ- 9 Score 0    0      Fall Risk     06/05/2024   11:44 AM 05/08/2024    8:39 AM 10/24/2023    8:44 AM 09/25/2023    3:41 PM 05/30/2023   11:36 AM  Fall Risk   Falls in the past year? 0 0 0 0 0  Number falls in past yr: 0 0 0 0 0  Injury with Fall? 0 0 0 0 0  Risk for fall due to :  No Fall Risks No Fall Risks No Fall Risks No Fall Risks  Follow up Falls evaluation completed;Falls prevention discussed Falls evaluation completed Falls evaluation completed Falls evaluation completed Falls prevention discussed    MEDICARE RISK AT HOME:  Medicare Risk at Home Any stairs in or around the home?: Yes If so, are there any without handrails?: No Home free of loose throw rugs in walkways, pet beds, electrical cords, etc?: Yes Adequate lighting in your  home to reduce risk of falls?: Yes Life alert?: No Use of a cane, walker or w/c?: No Grab bars in the bathroom?: Yes Shower chair or bench in shower?: No Elevated toilet seat or a handicapped toilet?: No  TIMED UP AND GO:  Was the test performed?  No  Cognitive Function: Unable: Due to language barrier, hearing or vision limitations or  other.        05/30/2023   11:36 AM  6CIT Screen  What Year? 0 points  What month? 0 points  What time? 0 points  Count back from 20 0 points  Months in reverse 0 points  Repeat phrase 0 points  Total Score 0 points    Immunizations Immunization History  Administered Date(s) Administered   Fluad Quad(high Dose 65+) 09/12/2019, 10/26/2020, 11/16/2021   Fluad Trivalent(High Dose 65+) 09/25/2023   H1N1 11/12/2008   Influenza Split 10/18/2010   Influenza Whole 01/07/2008   Influenza, High Dose Seasonal PF 11/09/2015, 10/03/2016, 10/23/2017, 11/20/2018   Influenza,inj,Quad PF,6+ Mos 09/16/2013   Pneumococcal Conjugate-13 09/16/2013   Pneumococcal Polysaccharide-23 05/24/2009, 06/05/2018   Td 05/24/2009   Tdap 09/12/2019   Zoster, Live 10/03/2016    Screening Tests Health Maintenance  Topic Date Due   Zoster Vaccines- Shingrix (1 of 2) 05/23/1966   Medicare Annual Wellness (AWV)  05/29/2024   Colonoscopy  05/08/2025 (Originally 06/06/2021)   INFLUENZA VACCINE  07/11/2024   OPHTHALMOLOGY EXAM  08/12/2024   HEMOGLOBIN A1C  11/08/2024   Diabetic kidney evaluation - eGFR measurement  05/08/2025   Diabetic kidney evaluation - Urine ACR  05/08/2025   FOOT EXAM  05/08/2025   DTaP/Tdap/Td (3 - Td or Tdap) 09/11/2029   Pneumococcal Vaccine: 50+ Years  Completed   DEXA SCAN  Completed   Hepatitis C Screening  Completed   Hepatitis B Vaccines  Aged Out   HPV VACCINES  Aged Out   Meningococcal B Vaccine  Aged Out   COVID-19 Vaccine  Discontinued    Health Maintenance  Health Maintenance Due  Topic Date Due   Zoster Vaccines- Shingrix (1 of 2) 05/23/1966   Medicare Annual Wellness (AWV)  05/29/2024   Health Maintenance Items Addressed: DEXA ordered, See Nurse Notes at the end of this note  Additional Screening:  Vision Screening: Recommended annual ophthalmology exams for early detection of glaucoma and other disorders of the eye. Would you like a referral to an eye  doctor? No    Dental Screening: Recommended annual dental exams for proper oral hygiene  Community Resource Referral / Chronic Care Management: CRR required this visit?  No   CCM required this visit?  No   Plan:    I have personally reviewed and noted the following in the patient's chart:   Medical and social history Use of alcohol, tobacco or illicit drugs  Current medications and supplements including opioid prescriptions. Patient is currently taking opioid prescriptions. Information provided to patient regarding non-opioid alternatives. Patient advised to discuss non-opioid treatment plan with their provider. Functional ability and status Nutritional status Physical activity Advanced directives List of other physicians Hospitalizations, surgeries, and ER visits in previous 12 months Vitals Screenings to include cognitive, depression, and falls Referrals and appointments  In addition, I have reviewed and discussed with patient certain preventive protocols, quality metrics, and best practice recommendations. A written personalized care plan for preventive services as well as general preventive health recommendations were provided to patient.   Andrea Anthony L Espyn Radwan, CMA   06/05/2024   After Visit Summary: (  MyChart) Due to this being a telephonic visit, the after visit summary with patients personalized plan was offered to patient via MyChart   Notes: Patient is due for a DEXA and order has been placed today.  She is due for a Shingrix vaccine as well.  Patient is up to date on all other health maintenance with no concerns to address today.

## 2024-07-11 ENCOUNTER — Telehealth: Payer: Self-pay

## 2024-07-11 NOTE — Telephone Encounter (Signed)
 Patient was identified as falling into the [place] Measure - Diabetes.   Patient was: Appointment already scheduled for:  11/12/24.

## 2024-09-30 ENCOUNTER — Encounter: Payer: Self-pay | Admitting: Internal Medicine

## 2024-11-03 ENCOUNTER — Other Ambulatory Visit: Payer: Self-pay | Admitting: Internal Medicine

## 2024-11-03 ENCOUNTER — Telehealth: Payer: Self-pay

## 2024-11-03 DIAGNOSIS — I7121 Aneurysm of the ascending aorta, without rupture: Secondary | ICD-10-CM

## 2024-11-03 NOTE — Telephone Encounter (Signed)
 Copied from CRM #[id number]. Topic: General - Other >> Nov 03, 2024  3:43 PM Thersia C wrote: Reason for CRM: D[facility] Janellaya - order was put in epic gatng will have to be done at another location wanting to confirm if they need gating or not will need to be updated if dont need to be gated

## 2024-11-05 NOTE — Telephone Encounter (Signed)
 Ok to hold on the gating part for this exam   Thanks!

## 2024-11-12 ENCOUNTER — Encounter: Payer: Self-pay | Admitting: Internal Medicine

## 2024-11-12 ENCOUNTER — Ambulatory Visit: Payer: Self-pay | Admitting: Internal Medicine

## 2024-11-12 ENCOUNTER — Ambulatory Visit: Admitting: Internal Medicine

## 2024-11-12 VITALS — BP 122/74 | HR 67 | Temp 98.5°F | Ht 72.0 in | Wt 186.0 lb

## 2024-11-12 DIAGNOSIS — Z23 Encounter for immunization: Secondary | ICD-10-CM

## 2024-11-12 DIAGNOSIS — E7800 Pure hypercholesterolemia, unspecified: Secondary | ICD-10-CM

## 2024-11-12 DIAGNOSIS — N1831 Chronic kidney disease, stage 3a: Secondary | ICD-10-CM

## 2024-11-12 DIAGNOSIS — M1712 Unilateral primary osteoarthritis, left knee: Secondary | ICD-10-CM | POA: Insufficient documentation

## 2024-11-12 DIAGNOSIS — I10 Essential (primary) hypertension: Secondary | ICD-10-CM

## 2024-11-12 DIAGNOSIS — E559 Vitamin D deficiency, unspecified: Secondary | ICD-10-CM | POA: Insufficient documentation

## 2024-11-12 LAB — CBC WITH DIFFERENTIAL/PLATELET
Basophils Absolute: 0.1 K/uL (ref 0.0–0.1)
Basophils Relative: 0.9 % (ref 0.0–3.0)
Eosinophils Absolute: 0.1 K/uL (ref 0.0–0.7)
Eosinophils Relative: 2.1 % (ref 0.0–5.0)
HCT: 41.2 % (ref 36.0–46.0)
Hemoglobin: 13.8 g/dL (ref 12.0–15.0)
Lymphocytes Relative: 43.1 % (ref 12.0–46.0)
Lymphs Abs: 3 K/uL (ref 0.7–4.0)
MCHC: 33.6 g/dL (ref 30.0–36.0)
MCV: 86.5 fl (ref 78.0–100.0)
Monocytes Absolute: 0.6 K/uL (ref 0.1–1.0)
Monocytes Relative: 8 % (ref 3.0–12.0)
Neutro Abs: 3.2 K/uL (ref 1.4–7.7)
Neutrophils Relative %: 45.9 % (ref 43.0–77.0)
Platelets: 210 K/uL (ref 150.0–400.0)
RBC: 4.76 Mil/uL (ref 3.87–5.11)
RDW: 13.8 % (ref 11.5–15.5)
WBC: 7 K/uL (ref 4.0–10.5)

## 2024-11-12 LAB — HEPATIC FUNCTION PANEL
ALT: 24 U/L (ref 0–35)
AST: 19 U/L (ref 0–37)
Albumin: 4.3 g/dL (ref 3.5–5.2)
Alkaline Phosphatase: 80 U/L (ref 39–117)
Bilirubin, Direct: 0.1 mg/dL (ref 0.0–0.3)
Total Bilirubin: 0.7 mg/dL (ref 0.2–1.2)
Total Protein: 7.3 g/dL (ref 6.0–8.3)

## 2024-11-12 LAB — BASIC METABOLIC PANEL WITH GFR
BUN: 19 mg/dL (ref 6–23)
CO2: 26 meq/L (ref 19–32)
Calcium: 10.4 mg/dL (ref 8.4–10.5)
Chloride: 103 meq/L (ref 96–112)
Creatinine, Ser: 1.18 mg/dL (ref 0.40–1.20)
GFR: 44.56 mL/min — ABNORMAL LOW (ref >60.00)
Glucose, Bld: 188 mg/dL — ABNORMAL HIGH (ref 70–99)
Potassium: 4.6 meq/L (ref 3.5–5.1)
Sodium: 138 meq/L (ref 135–145)

## 2024-11-12 LAB — LIPID PANEL
Cholesterol: 136 mg/dL (ref 0–200)
HDL: 48.5 mg/dL (ref >39.00)
LDL Cholesterol: 66 mg/dL (ref 0–99)
NonHDL: 87.62
Total CHOL/HDL Ratio: 3
Triglycerides: 109 mg/dL (ref 0.0–149.0)
VLDL: 21.8 mg/dL (ref 0.0–40.0)

## 2024-11-12 LAB — HEMOGLOBIN A1C: Hgb A1c MFr Bld: 8.7 % — ABNORMAL HIGH (ref 4.6–6.5)

## 2024-11-12 LAB — VITAMIN D 25 HYDROXY (VIT D DEFICIENCY, FRACTURES): VITD: 31.78 ng/mL (ref 30.00–100.00)

## 2024-11-12 MED ORDER — REPAGLINIDE 2 MG PO TABS: 2.0000 mg | ORAL_TABLET | Freq: Three times a day (TID) | ORAL | 3 refills | Status: AC

## 2024-11-12 NOTE — Patient Instructions (Addendum)
 Please have your Shingrix (shingles) shots done at your local pharmacy.  You had the flu shot today  Please try the OTC voltaren gel as needed for left knee arthritis, and consider sports medicine on the first floor for worsening pain or giveaways  Ok to increase the Repaglinide  (prandin ) to 2 mg with each meal  You are given the handicapped parking today  Please continue all other medications as before, and refills have been done if requested.  Please have the pharmacy call with any other refills you may need.  Please continue your efforts at being more active, low cholesterol diet, and weight control.  Please keep your appointments with your specialists as you may have planned  Please go to the LAB at the blood drawing area for the tests to be done  You will be contacted by phone if any changes need to be made immediately.  Otherwise, you will receive a letter about your results with an explanation, but please check with MyChart first.  Please make an Appointment to return in 6 months, or sooner if needed

## 2024-11-12 NOTE — Assessment & Plan Note (Addendum)
 Ckd3a  Lab Results  Component Value Date   HGBA1C 8.7 (H) 11/12/2024   uncontrolled, pt to continue current medical treatment farxiga  10 every day, metformin  ER 500 mg - 4 every day, actos  45 mg every day, and increased prandin  2 mg tid ac.

## 2024-11-12 NOTE — Assessment & Plan Note (Signed)
 Last vitamin D  Lab Results  Component Value Date   VD25OH 31.78 11/12/2024   Low, to start oral replacement

## 2024-11-12 NOTE — Assessment & Plan Note (Addendum)
 Mild to mod, for otc voltaren gel prn, to f/u any worsening symptoms or concerns, declines sport med or ortho referral for now, also for handicap parking ok

## 2024-11-12 NOTE — Assessment & Plan Note (Signed)
 Lab Results  Component Value Date   LDLCALC 66 11/12/2024   Stable, pt to continue current statin pravachol  40 mg qd

## 2024-11-12 NOTE — Assessment & Plan Note (Signed)
 BP Readings from Last 3 Encounters:  11/12/24 122/74  05/08/24 126/74  10/24/23 124/74   Stable, pt to continue medical treatment norvasc  5 every day, avapro  300 every day, bystolic  5 qd

## 2024-11-12 NOTE — Progress Notes (Signed)
 Patient ID: Andrea Anthony, female   DOB: 11/22/1946, 78 y.o.   MRN: [id number]        Chief Complaint: follow up HTN, HLD, DM, left knee arthritis       HPI:  Andrea Anthony is a 78 y.o. female here overall doing ok, Pt denies chest pain, increased sob or doe, wheezing, orthopnea, PND, increased LE swelling, palpitations, dizziness or syncope.   Pt denies polydipsia, polyuria, or new focal neuro s/s.    Pt denies fever, wt loss, night sweats, loss of appetite, or other constitutional symptoms  Goes to gym 3 times per wk but recently difficult due to more acute on chronic left knee pain, swelling and reduced ROM.  No giveaways or falls.  Due for flu shot Wt Readings from Last 3 Encounters:  11/12/24 186 lb (84.4 kg)  06/05/24 184 lb (83.5 kg)  05/08/24 184 lb (83.5 kg)   BP Readings from Last 3 Encounters:  11/12/24 122/74  05/08/24 126/74  10/24/23 124/74         Past Medical History:  Diagnosis Date   ALLERGIC RHINITIS 01/07/2008   Qualifier: Diagnosis of  By: Norleen MD, Lynwood ORN    [facility] DEFICIENCY 01/07/2008   Qualifier: Diagnosis of  By: Norleen MD, Lynwood ORN    DIABETES MELLITUS, TYPE II 06/24/2007   Qualifier: Diagnosis of  By: Norleen MD, Lynwood ORN    DIVERTICULOSIS, COLON 01/07/2008   Qualifier: Diagnosis of  By: Norleen MD, Lynwood ORN    Empty sella 03/17/2013   GERD 05/24/2009   Qualifier: Diagnosis of  By: Norleen MD, Lynwood ORN    HYPERLIPIDEMIA 06/01/2008   Qualifier: Diagnosis of  By: Norleen MD, Lynwood ORN    HYPERTENSION 06/24/2007   Qualifier: Diagnosis of  By: Norleen MD, Lynwood ORN    Lumbar disc disease 10/08/2012   l4-5 per CT 2008   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, 2001   PULMONARY NODULE, RIGHT LOWER LOBE 01/09/2008   Qualifier: Diagnosis of  By: Norleen MD, Lynwood ORN    Umbilical hernia 10/08/2012   Fat containing on CT 2008   Past Surgical History:  Procedure Laterality Date   NO PAST SURGERIES     UPPER GASTROINTESTINAL ENDOSCOPY      reports that she has never smoked. She  has never used smokeless tobacco. She reports that she does not drink alcohol and does not use drugs. family history includes Hypertension in her father. Allergies  Allergen Reactions   Ace Inhibitors     REACTION: cough   Sulfonamide Derivatives     Not sure of reaction   Current Outpatient Medications on File Prior to Visit  Medication Sig Dispense Refill   amLODipine  (NORVASC ) 5 MG tablet 1 tab by mouth once daily 90 tablet 3   aspirin 81 MG tablet Take 81 mg by mouth daily.     blood glucose meter kit and supplies KIT Dispense based on patient and insurance preference. Use up to four times daily as directed E11.9 1 each 0   dapagliflozin  propanediol (FARXIGA ) 10 MG TABS tablet Take 1 tablet (10 mg total) by mouth daily before breakfast. 90 tablet 3   gabapentin  (NEURONTIN ) 100 MG capsule TAKE 1 CAPSULE BY MOUTH 3 TIMES A DAY 270 capsule 1   irbesartan  (AVAPRO ) 300 MG tablet Take 1 tablet (300 mg total) by mouth daily. 90 tablet 3   metFORMIN  (GLUCOPHAGE -XR) 500 MG 24 hr tablet Take 4 tablets (2,000 mg total) by mouth daily  with breakfast. 360 tablet 3   nebivolol  (BYSTOLIC ) 5 MG tablet Take 1 tablet (5 mg total) by mouth daily. 90 tablet 3   OneTouch Delica Lancets 33G MISC Use to help check blood sugars twice a day Dx E11.9 200 each 11   pioglitazone  (ACTOS ) 45 MG tablet Take 1 tablet (45 mg total) by mouth daily. 90 tablet 3   pravastatin  (PRAVACHOL ) 40 MG tablet Take 1 tablet (40 mg total) by mouth daily. 90 tablet 3   tiZANidine  (ZANAFLEX ) 2 MG tablet 1 tab by mouth twice per day as needed for cramps 60 tablet 2   traMADol  (ULTRAM ) 50 MG tablet Take 1 tablet (50 mg total) by mouth every 8 (eight) hours as needed. 90 tablet 5   No current facility-administered medications on file prior to visit.        ROS:  All others reviewed and negative.  Objective        PE:  BP 122/74 (BP Location: Right Arm, Patient Position: Sitting, Cuff Size: Normal)   Pulse 67   Temp 98.5 F (36.9  C) (Oral)   Ht 6' (1.829 m)   Wt 186 lb (84.4 kg)   SpO2 98%   BMI 25.23 kg/m                 Constitutional: Pt appears in NAD               HENT: Head: NCAT.                Right Ear: External ear normal.                 Left Ear: External ear normal.                Eyes: . Pupils are equal, round, and reactive to light. Conjunctivae and EOM are normal               Nose: without d/c or deformity               Neck: Neck supple. Gross normal ROM               Cardiovascular: Normal rate and regular rhythm.                 Pulmonary/Chest: Effort normal and breath sounds without rales or wheezing.                Abd:  Soft, NT, ND, + BS, no organomegaly               Neurological: Pt is alert. At baseline orientation, motor grossly intact               Skin: Skin is warm. No rashes, no other new lesions, LE edema - none,  left knee with bony degenerative changes and mild to mod effusion               Psychiatric: Pt behavior is normal without agitation   Micro: none  Cardiac tracings I have personally interpreted today:  none  Pertinent Radiological findings (summarize): none   Lab Results  Component Value Date   WBC 7.0 11/12/2024   HGB 13.8 11/12/2024   HCT 41.2 11/12/2024   PLT 210.0 11/12/2024   GLUCOSE 188 (H) 11/12/2024   CHOL 136 11/12/2024   TRIG 109.0 11/12/2024   HDL 48.50 11/12/2024   LDLCALC 66 11/12/2024   ALT 24 11/12/2024   AST 19 11/12/2024  NA 138 11/12/2024   K 4.6 11/12/2024   CL 103 11/12/2024   CREATININE 1.18 11/12/2024   BUN 19 11/12/2024   CO2 26 11/12/2024   TSH 1.50 05/08/2024   HGBA1C 8.7 (H) 11/12/2024   MICROALBUR <0.7 05/08/2024   Assessment/Plan:  Andrea Anthony is a 78 y.o. Black or African American [2] female with  has a past medical history of ALLERGIC RHINITIS (01/07/2008), ANEMIA-IRON DEFICIENCY (01/07/2008), DIABETES MELLITUS, TYPE II (06/24/2007), DIVERTICULOSIS, COLON (01/07/2008), Empty sella (03/17/2013), GERD (05/24/2009),  HYPERLIPIDEMIA (06/01/2008), HYPERTENSION (06/24/2007), Lumbar disc disease (10/08/2012), MALT lymphoma (HCC) (06/08/2012), PULMONARY NODULE, RIGHT LOWER LOBE (01/09/2008), and Umbilical hernia (10/08/2012).  Diabetes mellitus with chronic kidney disease (HCC) Ckd3a  Lab Results  Component Value Date   HGBA1C 8.7 (H) 11/12/2024   uncontrolled, pt to continue current medical treatment farxiga  10 every day, metformin  ER 500 mg - 4 every day, actos  45 mg every day, and increased prandin  2 mg tid ac.    Hyperlipidemia Lab Results  Component Value Date   LDLCALC 66 11/12/2024   Stable, pt to continue current statin pravachol  40 mg qd   Essential hypertension BP Readings from Last 3 Encounters:  11/12/24 122/74  05/08/24 126/74  10/24/23 124/74   Stable, pt to continue medical treatment norvasc  5 every day, avapro  300 every day, bystolic  5 qd   Arthritis of left knee Mild to mod, for otc voltaren gel prn, to f/u any worsening symptoms or concerns, declines sport med or ortho referral for now  Vitamin D  deficiency Last vitamin D  Lab Results  Component Value Date   VD25OH 31.78 11/12/2024   Low, to start oral replacement  Followup: Return in about 6 months (around 05/13/2025).  Lynwood Rush, MD 11/12/2024 9:17 PM [facility]

## 2024-11-14 ENCOUNTER — Telehealth: Payer: Self-pay | Admitting: Internal Medicine

## 2024-11-14 DIAGNOSIS — I712 Thoracic aortic aneurysm, without rupture, unspecified: Secondary | ICD-10-CM

## 2024-11-14 NOTE — Telephone Encounter (Signed)
 Ok this order is changed thanks

## 2024-11-19 ENCOUNTER — Other Ambulatory Visit: Payer: Self-pay | Admitting: Internal Medicine

## 2024-11-19 ENCOUNTER — Other Ambulatory Visit: Payer: Self-pay

## 2024-11-25 ENCOUNTER — Other Ambulatory Visit

## 2024-12-26 ENCOUNTER — Other Ambulatory Visit: Payer: Self-pay

## 2024-12-26 ENCOUNTER — Other Ambulatory Visit: Payer: Self-pay | Admitting: Internal Medicine

## 2024-12-26 DIAGNOSIS — I10 Essential (primary) hypertension: Secondary | ICD-10-CM

## 2025-06-08 ENCOUNTER — Ambulatory Visit

## 2025-08-18 ENCOUNTER — Encounter: Admitting: Family Medicine
